# Patient Record
Sex: Female | Born: 1965 | Race: Black or African American | Hispanic: No | Marital: Single | Smoking: Former smoker
Health system: Southern US, Community
[De-identification: ages and names within clinical notes are randomized; demographics above are authoritative.]

## PROBLEM LIST (undated history)

## (undated) DIAGNOSIS — R600 Localized edema: Secondary | ICD-10-CM

## (undated) DIAGNOSIS — I10 Essential (primary) hypertension: Secondary | ICD-10-CM

## (undated) DIAGNOSIS — E119 Type 2 diabetes mellitus without complications: Secondary | ICD-10-CM

## (undated) DIAGNOSIS — E785 Hyperlipidemia, unspecified: Secondary | ICD-10-CM

## (undated) HISTORY — DX: Hyperlipidemia, unspecified: E78.5

---

## 2000-10-26 ENCOUNTER — Emergency Department (HOSPITAL_COMMUNITY): Admission: EM | Admit: 2000-10-26 | Discharge: 2000-10-26 | Payer: Self-pay | Admitting: Emergency Medicine

## 2000-10-26 ENCOUNTER — Encounter: Payer: Self-pay | Admitting: Emergency Medicine

## 2001-03-02 ENCOUNTER — Emergency Department (HOSPITAL_COMMUNITY): Admission: EM | Admit: 2001-03-02 | Discharge: 2001-03-02 | Payer: Self-pay | Admitting: Emergency Medicine

## 2001-05-19 ENCOUNTER — Other Ambulatory Visit: Admission: RE | Admit: 2001-05-19 | Discharge: 2001-05-19 | Payer: Self-pay | Admitting: Family Medicine

## 2002-09-27 ENCOUNTER — Emergency Department (HOSPITAL_COMMUNITY): Admission: EM | Admit: 2002-09-27 | Discharge: 2002-09-27 | Payer: Self-pay

## 2004-02-06 ENCOUNTER — Ambulatory Visit: Payer: Self-pay | Admitting: Family Medicine

## 2004-02-08 ENCOUNTER — Emergency Department (HOSPITAL_COMMUNITY): Admission: EM | Admit: 2004-02-08 | Discharge: 2004-02-08 | Payer: Self-pay | Admitting: Emergency Medicine

## 2005-11-25 ENCOUNTER — Emergency Department (HOSPITAL_COMMUNITY): Admission: EM | Admit: 2005-11-25 | Discharge: 2005-11-25 | Payer: Self-pay | Admitting: Emergency Medicine

## 2007-01-18 ENCOUNTER — Emergency Department (HOSPITAL_COMMUNITY): Admission: EM | Admit: 2007-01-18 | Discharge: 2007-01-19 | Payer: Self-pay | Admitting: Emergency Medicine

## 2007-04-16 ENCOUNTER — Emergency Department (HOSPITAL_COMMUNITY): Admission: EM | Admit: 2007-04-16 | Discharge: 2007-04-16 | Payer: Self-pay | Admitting: Family Medicine

## 2007-05-10 ENCOUNTER — Emergency Department (HOSPITAL_COMMUNITY): Admission: EM | Admit: 2007-05-10 | Discharge: 2007-05-10 | Payer: Self-pay | Admitting: Emergency Medicine

## 2007-07-16 ENCOUNTER — Emergency Department (HOSPITAL_COMMUNITY): Admission: EM | Admit: 2007-07-16 | Discharge: 2007-07-17 | Payer: Self-pay | Admitting: Emergency Medicine

## 2007-08-02 ENCOUNTER — Ambulatory Visit: Payer: Self-pay | Admitting: Family Medicine

## 2007-08-02 ENCOUNTER — Encounter: Payer: Self-pay | Admitting: Family Medicine

## 2007-08-02 LAB — CONVERTED CEMR LAB
AST: 17 units/L (ref 0–37)
Albumin: 4 g/dL (ref 3.5–5.2)
Alkaline Phosphatase: 79 units/L (ref 39–117)
BUN: 9 mg/dL (ref 6–23)
Basophils Relative: 0 % (ref 0–1)
Eosinophils Absolute: 0.1 10*3/uL (ref 0.0–0.7)
HDL: 47 mg/dL (ref >39)
Helicobacter Pylori Antibody-IgG: <0.4
LDL Cholesterol: 122 mg/dL — ABNORMAL HIGH (ref 0–99)
MCHC: 31.6 g/dL (ref 30.0–36.0)
MCV: 94.5 fL (ref 78.0–100.0)
Monocytes Relative: 8 % (ref 3–12)
Neutrophils Relative %: 42 % — ABNORMAL LOW (ref 43–77)
Platelets: 346 10*3/uL (ref 150–400)
Potassium: 4.2 meq/L (ref 3.5–5.3)
RBC: 4.39 M/uL (ref 3.87–5.11)
RDW: 14 % (ref 11.5–15.5)
TSH: 0.664 microintl units/mL (ref 0.350–5.50)
Total CHOL/HDL Ratio: 3.8

## 2007-08-04 ENCOUNTER — Ambulatory Visit (HOSPITAL_COMMUNITY): Admission: RE | Admit: 2007-08-04 | Discharge: 2007-08-04 | Payer: Self-pay | Admitting: Family Medicine

## 2007-09-26 ENCOUNTER — Emergency Department (HOSPITAL_COMMUNITY): Admission: EM | Admit: 2007-09-26 | Discharge: 2007-09-26 | Payer: Self-pay | Admitting: Family Medicine

## 2008-01-24 ENCOUNTER — Emergency Department (HOSPITAL_COMMUNITY): Admission: EM | Admit: 2008-01-24 | Discharge: 2008-01-24 | Payer: Self-pay | Admitting: Family Medicine

## 2008-04-23 ENCOUNTER — Emergency Department (HOSPITAL_COMMUNITY): Admission: EM | Admit: 2008-04-23 | Discharge: 2008-04-23 | Payer: Self-pay | Admitting: Family Medicine

## 2008-12-07 ENCOUNTER — Emergency Department (HOSPITAL_COMMUNITY): Admission: EM | Admit: 2008-12-07 | Discharge: 2008-12-07 | Payer: Self-pay | Admitting: Emergency Medicine

## 2009-03-05 IMAGING — US US ABDOMEN COMPLETE
1 series · 14 of 25 positions shown · non-contrast
Comparison: Abdominal radiographs 01/19/2007.

CLINICAL DATA: Recurrent abdominal pain and nausea

ABDOMEN ULTRASOUND
TECHNIQUE: Complete abdominal ultrasound examination was performed
including evaluation of the liver, gallbladder, bile ducts,
pancreas, kidneys, spleen, IVC, and abdominal aorta.

[Series 1: unknown · 0.33mm/px · 14 of 62 slices shown]
[im 1/62]
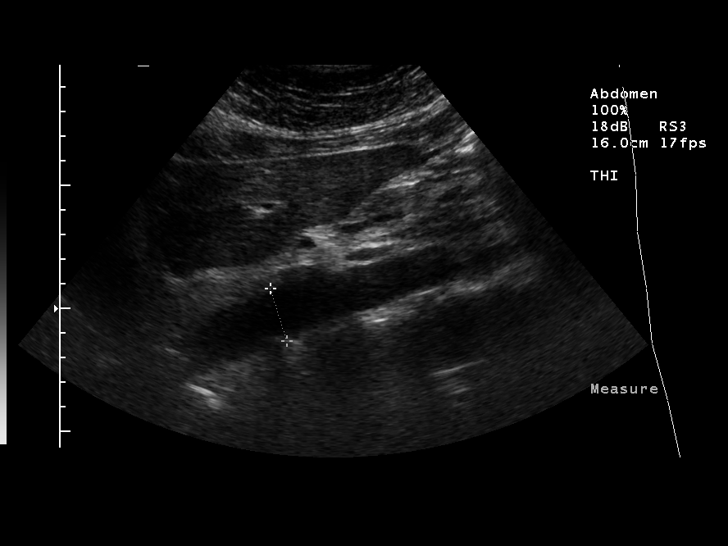
[im 6/62]
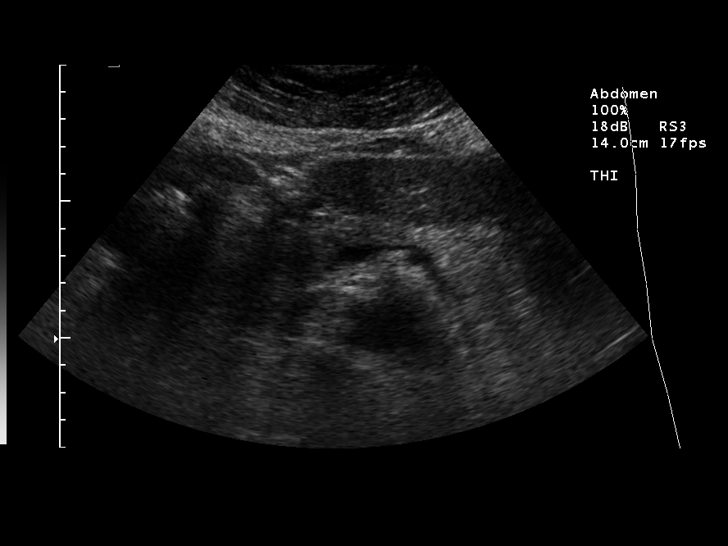
[im 11/62]
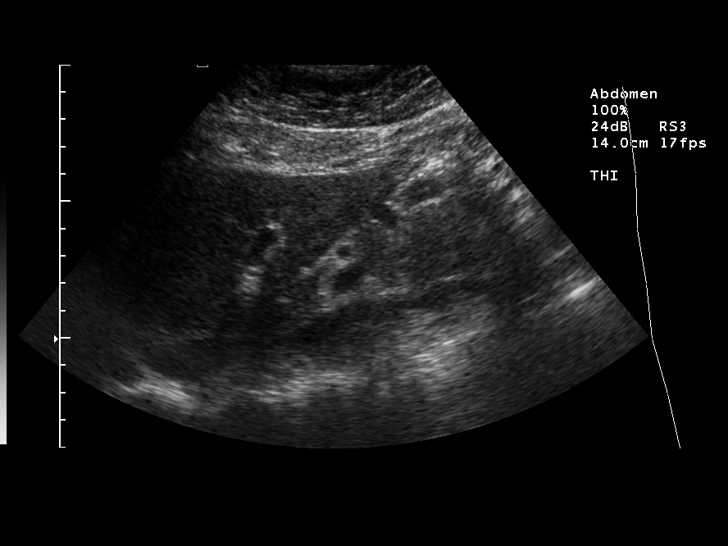
[im 16/62]
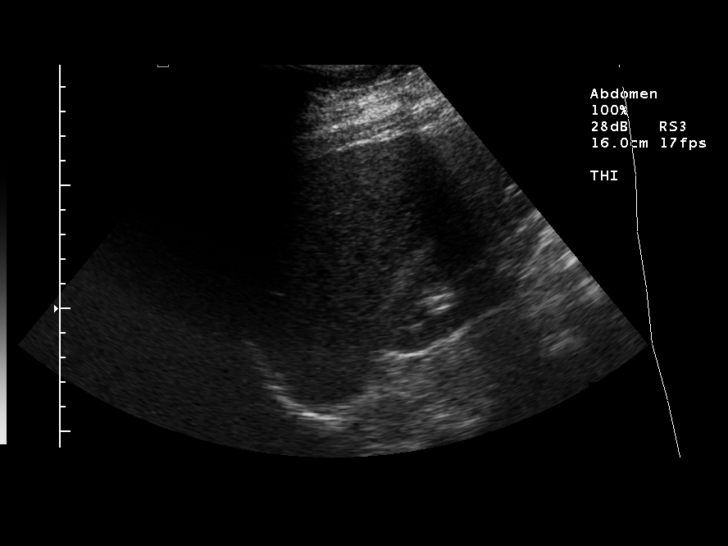
[im 21/62]
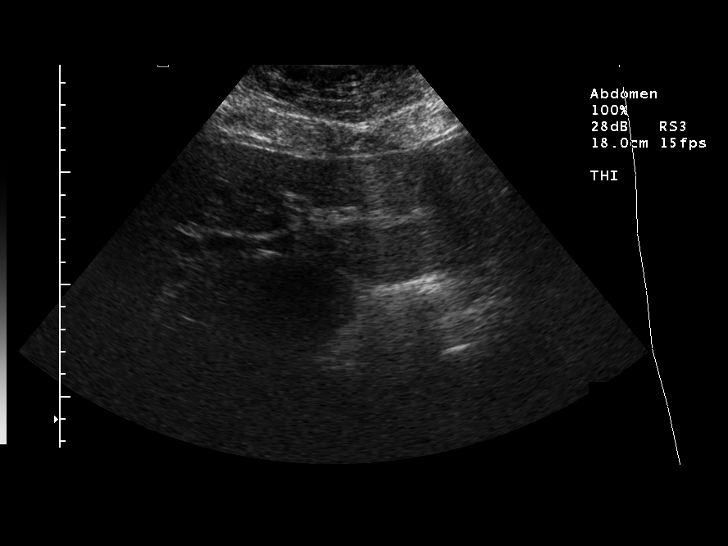
[im 23/62]
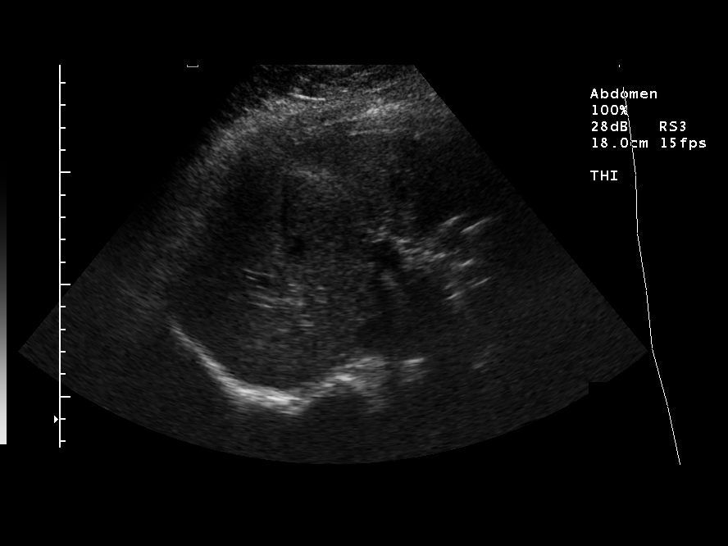
[im 28/62]
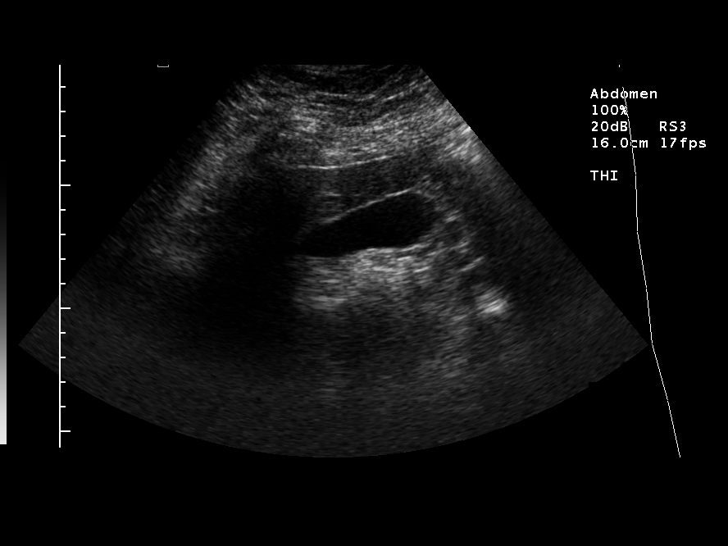
[im 34/62]
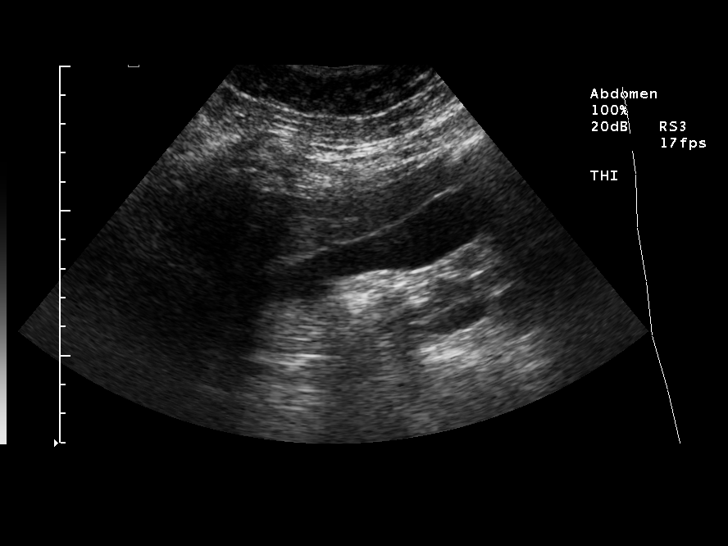
[im 39/62]
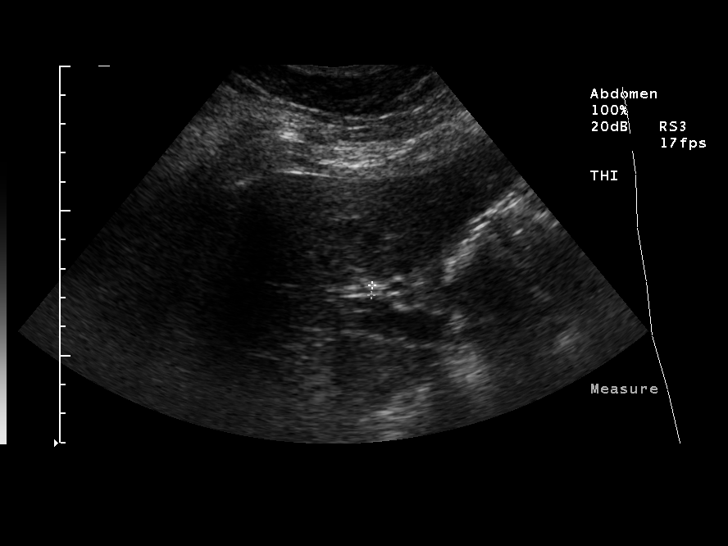
[im 41/62]
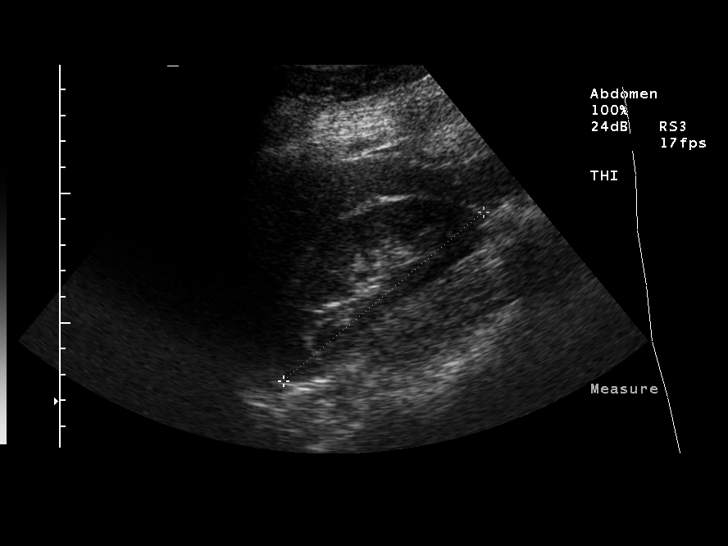
[im 46/62]
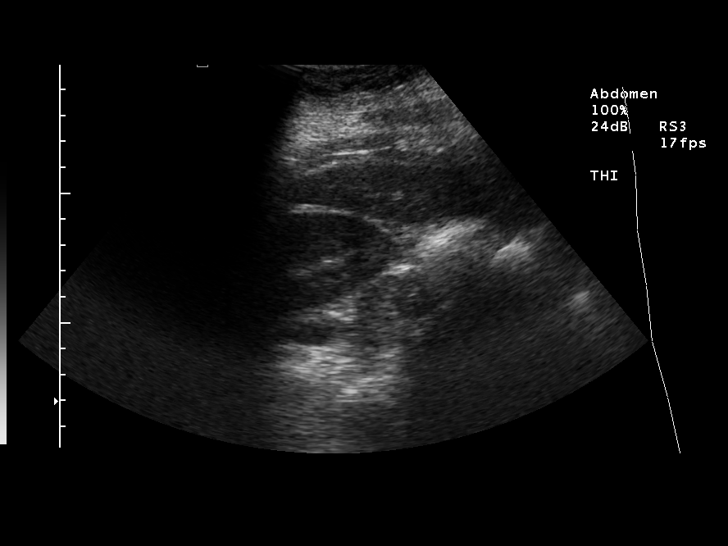
[im 51/62]
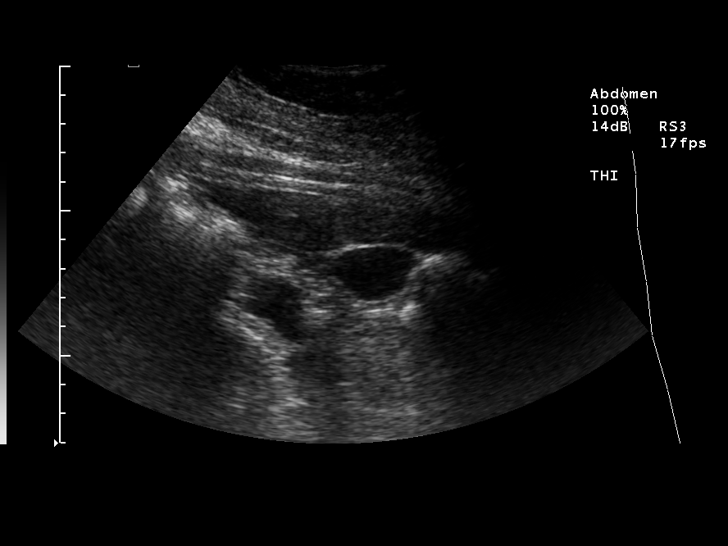
[im 56/62]
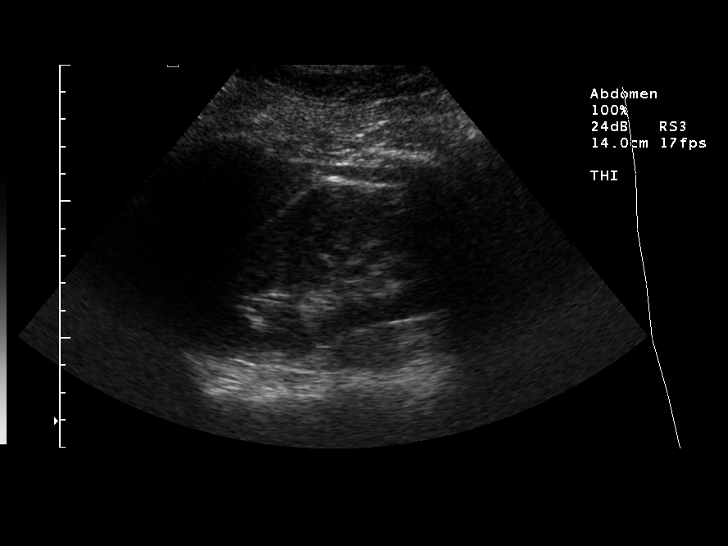
[im 62/62]
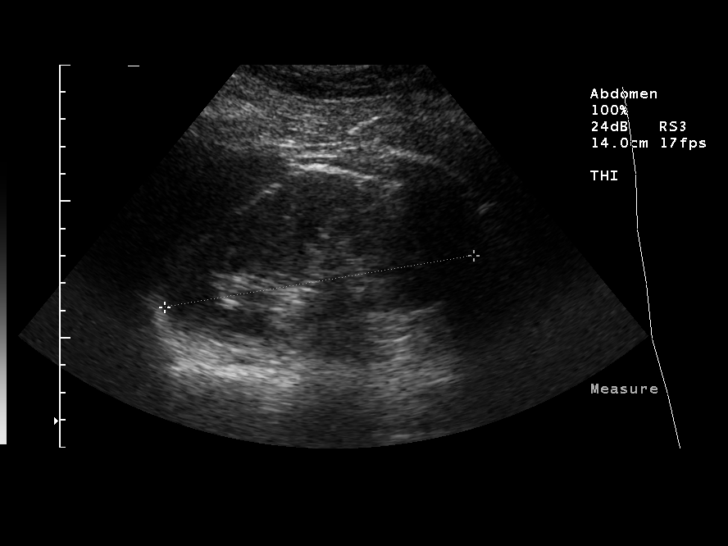

[14 of 25 positions shown; findings below may reference images not displayed]

The report from an
abdominal ultrasound dated 10/26/2000 is correlated - that study is
not available for direct comparison.
FINDINGS: The gallbladder appears normal without gallstones or wall
thickening.  There is no biliary dilatation.  The visualized liver,
spleen, pancreas, IVC and abdominal aorta appear normal.  There is
no ascites.  Both kidneys appear normal, measuring 10.1 cm in
length on the right and 11.4 cm in length on the left.
IMPRESSION: Normal abdominal ultrasound.

## 2009-12-24 ENCOUNTER — Emergency Department (HOSPITAL_COMMUNITY): Admission: EM | Admit: 2009-12-24 | Discharge: 2009-12-24 | Payer: Self-pay | Admitting: Family Medicine

## 2010-09-14 ENCOUNTER — Inpatient Hospital Stay (INDEPENDENT_AMBULATORY_CARE_PROVIDER_SITE_OTHER)
Admission: RE | Admit: 2010-09-14 | Discharge: 2010-09-14 | Disposition: A | Discharge status: Home / Self Care | Payer: Self-pay | Source: Ambulatory Visit | Attending: Emergency Medicine | Admitting: Emergency Medicine

## 2010-09-14 DIAGNOSIS — M779 Enthesopathy, unspecified: Secondary | ICD-10-CM

## 2010-11-05 LAB — URINALYSIS, ROUTINE W REFLEX MICROSCOPIC
Bilirubin Urine: NEGATIVE
Glucose, UA: NEGATIVE
Ketones, ur: NEGATIVE
pH: 6

## 2010-11-05 LAB — POCT PREGNANCY, URINE: Preg Test, Ur: NEGATIVE

## 2010-11-05 LAB — HEPATIC FUNCTION PANEL
Alkaline Phosphatase: 87
Bilirubin, Direct: <0.1
Total Protein: 7.2

## 2010-11-05 LAB — CBC
HCT: 38.6
Hemoglobin: 13
MCHC: 33.6
MCV: 93.3
RDW: 14.5

## 2010-11-05 LAB — POCT I-STAT, CHEM 8
BUN: 12
Calcium, Ion: 1.23
HCT: 40
TCO2: 29

## 2010-11-05 LAB — LIPASE, BLOOD: Lipase: 31

## 2010-11-05 LAB — DIFFERENTIAL
Basophils Absolute: 0
Basophils Relative: 0
Eosinophils Relative: 2
Monocytes Absolute: 0.6

## 2010-11-12 LAB — POCT URINALYSIS DIP (DEVICE)
Glucose, UA: NEGATIVE mg/dL
Hgb urine dipstick: NEGATIVE
Nitrite: NEGATIVE
Specific Gravity, Urine: 1.02 (ref 1.005–1.030)
pH: 6 (ref 5.0–8.0)

## 2010-11-16 LAB — URINALYSIS, ROUTINE W REFLEX MICROSCOPIC
Ketones, ur: NEGATIVE
Nitrite: NEGATIVE
Specific Gravity, Urine: 1.016
pH: 7

## 2010-11-16 LAB — COMPREHENSIVE METABOLIC PANEL
BUN: 9
CO2: 30
Calcium: 9.3
Creatinine, Ser: 0.91
GFR calc non Af Amer: >60
Glucose, Bld: 96
Total Protein: 7.3

## 2010-11-16 LAB — DIFFERENTIAL
Eosinophils Absolute: 0.1 — ABNORMAL LOW
Lymphocytes Relative: 38
Lymphs Abs: 3.2
Monocytes Relative: 9
Neutro Abs: 4.3
Neutrophils Relative %: 52

## 2010-11-16 LAB — CBC
Hemoglobin: 12.7
MCHC: 33.4
MCV: 94
RBC: 4.03
RDW: 13.8

## 2010-11-16 LAB — PREGNANCY, URINE: Preg Test, Ur: NEGATIVE

## 2010-11-16 LAB — LIPASE, BLOOD: Lipase: 54

## 2011-01-15 ENCOUNTER — Emergency Department (INDEPENDENT_AMBULATORY_CARE_PROVIDER_SITE_OTHER)
Admission: EM | Admit: 2011-01-15 | Discharge: 2011-01-15 | Disposition: A | Discharge status: Home / Self Care | Payer: Self-pay | Source: Home / Self Care | Attending: Emergency Medicine | Admitting: Emergency Medicine

## 2011-01-15 ENCOUNTER — Encounter: Payer: Self-pay | Admitting: Emergency Medicine

## 2011-01-15 DIAGNOSIS — J069 Acute upper respiratory infection, unspecified: Secondary | ICD-10-CM

## 2011-01-15 MED ORDER — BENZONATATE 200 MG PO CAPS: 200.0000 mg | ORAL_CAPSULE | Freq: Three times a day (TID) | ORAL | Status: AC | PRN

## 2011-01-15 MED ORDER — TRAMADOL HCL 50 MG PO TABS: 100.0000 mg | ORAL_TABLET | Freq: Three times a day (TID) | ORAL | Status: AC | PRN

## 2011-01-15 NOTE — ED Notes (Signed)
Starting this morning pt has pain in chest when coughing. She also has chills, and gets sweaty. Non productive cough.

## 2011-01-15 NOTE — ED Provider Notes (Signed)
History     CSN: [id number] Arrival date & time: 01/15/2011  4:31 PM   First MD Initiated Contact with Patient 01/15/11 1637      Chief Complaint  Patient presents with  . Cough    (Consider location/radiation/quality/duration/timing/severity/associated sxs/prior treatment) HPI Comments: Sarah Macdonald has had a one-day history of nonproductive cough, aching in chest, wheezing, has felt hot and cold, has had rhinorrhea which is yellow, and headache. She denies any sore throat, nausea, vomiting, or diarrhea. She has had no specific exposures, has not had a flu shot, and she does smoke a quarter pack of cigarettes a day. She has not tried any medications. She rates her pain as an 8/10 in intensity for the chest.  Patient is a 45 y.o. female presenting with cough.  Cough Associated symptoms include chest pain, chills, rhinorrhea and wheezing. Pertinent negatives include no ear pain, no sore throat, no shortness of breath and no eye redness.    History reviewed. No pertinent past medical history.  History reviewed. No pertinent past surgical history.  History reviewed. No pertinent family history.  History  Substance Use Topics  . Smoking status: Current Everyday Smoker -- 0.2 packs/day  . Smokeless tobacco: Not on file  . Alcohol Use: No    OB History    Grav Para Term Preterm Abortions TAB SAB Ect Mult Living                  Review of Systems  Constitutional: Positive for fever, chills and fatigue.  HENT: Positive for rhinorrhea. Negative for ear pain, congestion, sore throat, sneezing, neck stiffness, voice change and postnasal drip.   Eyes: Negative for pain, discharge and redness.  Respiratory: Positive for cough and wheezing. Negative for chest tightness and shortness of breath.   Cardiovascular: Positive for chest pain.  Gastrointestinal: Negative for nausea, vomiting, abdominal pain and diarrhea.  Skin: Negative for rash.    Allergies  Review of patient's allergies  indicates no known allergies.  Home Medications   Current Outpatient Rx  Name Route Sig Dispense Refill  . BENZONATATE 200 MG PO CAPS Oral Take 1 capsule (200 mg total) by mouth 3 (three) times daily as needed for cough. 30 capsule 0  . TRAMADOL HCL 50 MG PO TABS Oral Take 2 tablets (100 mg total) by mouth every 8 (eight) hours as needed for pain. Maximum dose= 8 tablets per day 30 tablet 0    BP 145/73  Pulse 85  Temp(Src) 99.9 F (37.7 C) (Oral)  Resp 20  SpO2 98%  LMP 12/28/2010  Physical Exam  Nursing note and vitals reviewed. Constitutional: She appears well-developed and well-nourished. No distress.  HENT:  Head: Normocephalic and atraumatic.  Right Ear: External ear normal.  Left Ear: External ear normal.  Nose: Nose normal.  Mouth/Throat: Oropharynx is clear and moist. No oropharyngeal exudate.  Eyes: Conjunctivae and EOM are normal. Pupils are equal, round, and reactive to light. Right eye exhibits no discharge. Left eye exhibits no discharge.  Neck: Normal range of motion. Neck supple.  Cardiovascular: Normal rate, regular rhythm and normal heart sounds.   Pulmonary/Chest: Effort normal and breath sounds normal. No stridor. No respiratory distress. She has no wheezes. She has no rales. She exhibits no tenderness.  Lymphadenopathy:    She has no cervical adenopathy.  Skin: Skin is warm and dry. No rash noted. She is not diaphoretic.    ED Course  Procedures (including critical care time)  Labs Reviewed - No data  to display No results found.   1. Upper respiratory tract infection       MDM          Roque Lias, MD 01/15/11 [id number]

## 2011-04-17 ENCOUNTER — Emergency Department (INDEPENDENT_AMBULATORY_CARE_PROVIDER_SITE_OTHER)
Admission: EM | Admit: 2011-04-17 | Discharge: 2011-04-17 | Disposition: A | Discharge status: Home / Self Care | Payer: 59 | Source: Home / Self Care | Attending: Family Medicine | Admitting: Family Medicine

## 2011-04-17 ENCOUNTER — Encounter (HOSPITAL_COMMUNITY): Payer: Self-pay | Admitting: Emergency Medicine

## 2011-04-17 DIAGNOSIS — G4762 Sleep related leg cramps: Secondary | ICD-10-CM

## 2011-04-17 LAB — POCT I-STAT, CHEM 8
Glucose, Bld: 110 mg/dL — ABNORMAL HIGH (ref 70–99)
HCT: 40 % (ref 36.0–46.0)
Hemoglobin: 13.6 g/dL (ref 12.0–15.0)
Potassium: 3.9 mEq/L (ref 3.5–5.1)

## 2011-04-17 NOTE — ED Notes (Signed)
Cramping in both legs from groin to feet.  Particularly noticed at night.  Patient has also noticed resent swelling in feet and knees.  Reports cramping sensation for a week.

## 2011-04-17 NOTE — ED Provider Notes (Signed)
History     CSN: [id number]  Arrival date & time 04/17/11  7829   First MD Initiated Contact with Patient 04/17/11 1106      Chief Complaint  Patient presents with  . Leg Pain    (Consider location/radiation/quality/duration/timing/severity/associated sxs/prior treatment) HPI Comments: Sarah Macdonald presents for evaluation of bilateral leg cramping at night. She reports onset of symptoms over the last week. She denies any specific trigger or injury. She reports that she does a lot of walking with her job, but that this job is not new. She reports that she has done. This job for the last year and 3 months. She states that she was given a specific pair of shoes to wear at work with uniform, but that they were too big on her. She denies any new medications. She denies any dietary changes.   The history is provided by the patient.    History reviewed. No pertinent past medical history.  History reviewed. No pertinent past surgical history.  History reviewed. No pertinent family history.  History  Substance Use Topics  . Smoking status: Current Everyday Smoker -- 0.2 packs/day  . Smokeless tobacco: Not on file  . Alcohol Use: No    OB History    Grav Para Term Preterm Abortions TAB SAB Ect Mult Living                  Review of Systems  Constitutional: Negative.   HENT: Negative.   Eyes: Negative.   Respiratory: Negative.   Cardiovascular: Positive for leg swelling.  Gastrointestinal: Negative.   Genitourinary: Negative.   Musculoskeletal: Positive for myalgias.  Skin: Negative.   Neurological: Negative.     Allergies  Review of patient's allergies indicates no known allergies.  Home Medications  No current outpatient prescriptions on file.  BP 160/90  Pulse 69  Temp(Src) 97.9 F (36.6 C) (Oral)  Resp 20  SpO2 100%  LMP 04/10/2011  Physical Exam  Nursing note and vitals reviewed. Constitutional: She is oriented to person, place, and time. She appears  well-developed and well-nourished.  HENT:  Head: Normocephalic and atraumatic.  Eyes: EOM are normal.  Neck: Normal range of motion.  Pulmonary/Chest: Effort normal.  Musculoskeletal: Normal range of motion.       Left lower leg: She exhibits swelling and edema.       Legs:      No tenderness to palpation; full range of motion in lower extremities; DP and PT pulses palpated in lower extremities bilaterally; 5/5 strength; no difficulty with ambulation  Neurological: She is alert and oriented to person, place, and time.  Skin: Skin is warm and dry.  Psychiatric: Her behavior is normal.    ED Course  Procedures (including critical care time)  Labs Reviewed  POCT I-STAT, CHEM 8 - Abnormal; Notable for the following:    Glucose, Bld 110 (*)    All other components within normal limits   No results found.   1. Nocturnal leg cramps       MDM  Labs reviewed; no abnormalities noted; exam unremarkable; referred to [facility] to establish care with a primary provider        Renaee Munda, MD 04/17/11 1351

## 2011-04-17 NOTE — Discharge Instructions (Signed)
It is unclear at this time is causing your symptoms. There are, in fact, many causes for her symptoms. Each of these causes requires an extensive workup. Your lab work today was normal. Essentially ruling out some causes. Please call the number on your discharge paperwork, and establish care with a provider that accepts your insurance. Please return to care, sooner should your symptoms not improve, or worsen in any way.

## 2011-04-21 ENCOUNTER — Emergency Department (HOSPITAL_COMMUNITY)
Admission: EM | Admit: 2011-04-21 | Discharge: 2011-04-21 | Disposition: A | Discharge status: Home Health | Payer: 59 | Attending: Emergency Medicine | Admitting: Emergency Medicine

## 2011-04-21 ENCOUNTER — Encounter (HOSPITAL_COMMUNITY): Payer: Self-pay | Admitting: *Deleted

## 2011-04-21 DIAGNOSIS — M7989 Other specified soft tissue disorders: Secondary | ICD-10-CM | POA: Insufficient documentation

## 2011-04-21 DIAGNOSIS — F17200 Nicotine dependence, unspecified, uncomplicated: Secondary | ICD-10-CM | POA: Insufficient documentation

## 2011-04-21 NOTE — ED Notes (Signed)
To ed for eval bilateral leg swelling since last night. No sob. States her legs cramp every night.

## 2011-04-21 NOTE — Discharge Instructions (Signed)
Follow back up with urgent care or use resource guide below to find a primary care doctor. Suspect swelling do to steroid shot given to you at urgent care 2 days ago. Seek followup for new or worse symptoms that include shortness of breath or chest pain or if leg swelling does not go away over the next 2-4 days.  RESOURCE GUIDE  Dental Problems  Patients with Medicaid: [facility] [id number]                                           [id number] Phone:  [phone]                                                  Phone:  [phone]  If unable to pay or uninsured, contact:  [facility] or [facility]. to become qualified for the adult dental clinic.  Chronic Pain Problems Contact [facility]  [phone] Patients need to be referred by their primary care doctor.  Insufficient Money for Medicine Contact [employer]:  call "211" or [facility] [phone].  No Primary Care Doctor Call [facility]  [phone] Other agencies that provide inexpensive medical care    [facility]  [phone]    [facility]  [phone]    [facility]  [phone]    [facility]  [phone]    [facility]  [phone]    [facility]  [phone]  Psychological Services [facility]  [phone] [facility]  [phone] [facility]   [phone] (emergency services ([phone])  Substance Abuse Resources [facility]  [phone] [facility] [phone] [facility] [place] [facility] [phone] Floydene Flock [phone] Residential & Outpatient Substance Abuse Program  [phone]  Abuse/Neglect [facility] Child Abuse Hotline 256-375-4574 [facility] Child Abuse Hotline [phone] (After Hours)  Emergency Shelter [facility] [phone]  Maternity Homes Room at the  [facility] ([phone] [facility] (612)666-5833  MRSA Hotline #:   772 196 6846    [facility] Resources  [facility]     [employer]                          [facility] Dept. [address] [place]                       [address]      [address]  [place]                                                Cristobal Goldmann Phone:  [phone]  Phone:  [phone]                 Phone:  [phone]  [place] [facility] Phone:  [phone]  [place] Child Abuse Hotline (336) 342-1394 [phone]) [phone] (After Hours)   

## 2011-04-21 NOTE — ED Provider Notes (Signed)
History     CSN: [id number]  Arrival date & time 04/21/11  4696   First MD Initiated Contact with Patient 04/21/11 1116      Chief Complaint  Patient presents with  . Leg Swelling    (Consider location/radiation/quality/duration/timing/severity/associated sxs/prior treatment) The history is provided by the patient.   patient is a [age]-year-old female process with the complaint of bilateral leg swelling that started last night that improved overnight but worse this morning she got back on her feet she has had a history of bilateral leg cramps on and off for the past year was seen in urgent care on March 9 for this had the i-STAT metabolic panel drawn and was given a shot of steroids most likely not able to specifically confirm that. Denies shortness of breath chest pain swelling anywhere else.  History reviewed. No pertinent past medical history.  History reviewed. No pertinent past surgical history.  History reviewed. No pertinent family history.  History  Substance Use Topics  . Smoking status: Current Everyday Smoker -- 0.2 packs/day  . Smokeless tobacco: Not on file  . Alcohol Use: No    OB History    Grav Para Term Preterm Abortions TAB SAB Ect Mult Living                  Review of Systems  Constitutional: Negative for fever and chills.  HENT: Negative for congestion, facial swelling and neck pain.   Eyes: Negative for visual disturbance.  Respiratory: Negative for cough and shortness of breath.   Cardiovascular: Positive for leg swelling. Negative for chest pain.  Gastrointestinal: Negative for nausea, vomiting, abdominal pain and diarrhea.  Genitourinary: Negative for dysuria.  Musculoskeletal: Negative for back pain.  Skin: Negative for rash.  Neurological: Negative for weakness, numbness and headaches.  Hematological: Does not bruise/bleed easily.    Allergies  Review of patient's allergies indicates no known allergies.  Home Medications  No current  outpatient prescriptions on file.  BP 154/97  Pulse 75  Temp(Src) 98.6 F (37 C) (Oral)  SpO2 98%  LMP 04/10/2011  Physical Exam  Nursing note and vitals reviewed. Constitutional: She is oriented to person, place, and time. She appears well-developed and well-nourished. No distress.  HENT:  Head: Normocephalic and atraumatic.  Mouth/Throat: Oropharynx is clear and moist.  Eyes: Conjunctivae and EOM are normal. Pupils are equal, round, and reactive to light.  Neck: Normal range of motion. Neck supple.  Cardiovascular: Normal rate, regular rhythm, normal heart sounds and intact distal pulses.   No murmur heard. Pulmonary/Chest: Effort normal and breath sounds normal. No respiratory distress.  Abdominal: Soft. Bowel sounds are normal. There is no tenderness.  Musculoskeletal: Normal range of motion. She exhibits edema. She exhibits no tenderness.       Bilateral lower extremity swelling mostly at the ankles no pitting edema. Neurovascular intact. No swelling of the knees or thighs.  Neurological: She is alert and oriented to person, place, and time. No cranial nerve deficit. She exhibits normal muscle tone. Coordination normal.  Skin: Skin is warm. No rash noted. No erythema.    ED Course  Procedures (including critical care time)  Labs Reviewed - No data to display No results found.   1. Leg swelling       MDM   Patient seen at urgent care on March 9 for history of leg cramps that have been going on for one year patient had i-STAT metabolic panel without significant findings and patient was given  most likely a steroid shot. Presents today with onset of bilateral lower extremity leg swelling that started yesterday improved overnight and then once back on her feet started to get worse again denies swelling anywhere else chest pain or shortness of breath. Suspect swelling may be due to to steroid shot and would expect that to resolve his steroid medication wears off over the next  few days. Patient given referral information for followup. Work note provided for today.        Shelda Jakes, MD 04/21/11 [id number]

## 2011-05-05 ENCOUNTER — Emergency Department (HOSPITAL_COMMUNITY)
Admission: EM | Admit: 2011-05-05 | Discharge: 2011-05-05 | Disposition: A | Discharge status: Home / Self Care | Payer: 59 | Source: Home / Self Care | Attending: Emergency Medicine | Admitting: Emergency Medicine

## 2011-05-05 ENCOUNTER — Encounter (HOSPITAL_COMMUNITY): Payer: Self-pay | Admitting: *Deleted

## 2011-05-05 DIAGNOSIS — R600 Localized edema: Secondary | ICD-10-CM

## 2011-05-05 DIAGNOSIS — R609 Edema, unspecified: Secondary | ICD-10-CM

## 2011-05-05 DIAGNOSIS — R030 Elevated blood-pressure reading, without diagnosis of hypertension: Secondary | ICD-10-CM

## 2011-05-05 MED ORDER — MEDICAL COMPRESSION STOCKINGS MISC: 1.0000 | Freq: Every day | Status: DC

## 2011-05-05 MED ORDER — FUROSEMIDE 20 MG PO TABS: 20.0000 mg | ORAL_TABLET | Freq: Every day | ORAL | Status: DC | PRN

## 2011-05-05 NOTE — Discharge Instructions (Signed)
The swelling in your legs can be from a combination of factors including sodium (salt) intake,and prolonged standing or sitting.  Reduce your salt intake to 2000 mg of sodium per day. Read all of your food labels for the amount of sodium per serving. Increase your water intake to 6-8 glasses per day. Wear support stockings while working. If standing for long periods of time, walking in place. Elevate your legs above heart level. Prop them up on a couple of pillows at night while sleeping. It is important that you obtain a primary care dr. Bonita Quin need follow up for your elevated blood pressure and your leg swelling.

## 2011-05-05 NOTE — ED Notes (Signed)
Pt seen and treated x 2 3/9 and 3/13 re: bilateral leg and feet swelling - right leg and foot resolved continues with left leg and foot swelling and cramping

## 2011-05-05 NOTE — ED Provider Notes (Signed)
History     CSN: [id number]  Arrival date & time 05/05/11  4098   First MD Initiated Contact with Patient 05/05/11 [id number]      Chief Complaint  Patient presents with  . Leg Swelling  . Foot Pain    (Consider location/radiation/quality/duration/timing/severity/associated sxs/prior treatment) HPI Comments: Patient presents today with complaints of swelling in bilateral lower legs. She has been evaluated on March 9 and March 13 for the same complaints. She states that the swelling in her right leg significantly improved but the swelling in her left leg persists. 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 She has been soaking her legs and applying rubbing alcohol for her symptoms. HER-2 previous visits patient has been provided information to obtain a primary care physician for followup. She states that she has not done this and no longer has the information.   History reviewed. No pertinent past medical history.  History reviewed. No pertinent past surgical history.  History reviewed. No pertinent family history.  History  Substance Use Topics  . Smoking status: Current Everyday Smoker -- 0.2 packs/day  . Smokeless tobacco: Not on file  . Alcohol Use: No    OB History    Grav Para Term Preterm Abortions TAB SAB Ect Mult Living                  Review of Systems  Constitutional: Negative for fever and chills.  Respiratory: Negative for cough and shortness of breath.   Cardiovascular: Positive for leg swelling. Negative for chest pain.  Musculoskeletal: Negative for joint swelling.    Allergies  Review of patient's allergies indicates no known allergies.  Home Medications   Current Outpatient Rx  Name Route Sig Dispense Refill  . MULTI-VITAMIN/MINERALS PO TABS Oral Take 1 tablet by mouth daily.    Marland Kitchen MEDICAL COMPRESSION STOCKINGS MISC Does not apply 1 Package by Does not apply route daily. 1 each 0    . FUROSEMIDE 20 MG PO TABS Oral Take 1 tablet (20 mg total) by mouth daily as needed (for leg swelling). 5 tablet 0    BP 165/97  Pulse 70  Temp(Src) 97.9 F (36.6 C) (Oral)  Resp 16  SpO2 99%  LMP 04/10/2011  Physical Exam  Nursing note and vitals reviewed. Constitutional: She appears well-developed and well-nourished. No distress.  HENT:  Head: Normocephalic and atraumatic.  Cardiovascular: Normal rate, regular rhythm and normal heart sounds.   Pulses:      Dorsalis pedis pulses are 2+ on the right side, and 2+ on the left side.       Posterior tibial pulses are 2+ on the right side, and 2+ on the left side.       Trace PTE Rt LE. 1+ PTE Lt LE.   Pulmonary/Chest: Effort normal and breath sounds normal. No respiratory distress.  Skin: Skin is warm and dry.    ED Course  Procedures (including critical care time)  Labs Reviewed - No data to display No results found.   1. Peripheral edema   2. Blood pressure elevated without history of HTN       MDM  Two previous visits reviewed (3/9 & 3/13). I stat 3/9. Persistent peripheral edema, though improved RLE. Discussed sodium reduction, elevating legs, support stockings during work hrs, avoid prolonged sitting and standing.  BP also noted to be in Stage I HTN or greater all 3 visits.  This was discussed with pt who responds "I don't have high blood pressure." Discussed that she will need follow up and consideration for prescription treatment for high blood pressure though pt is resistant.         Melody Comas, [place] 05/05/11 1042

## 2011-05-10 NOTE — ED Provider Notes (Signed)
Medical screening examination/treatment/procedure(s) were performed by non-physician practitioner and as supervising physician I was immediately available for consultation/collaboration.  Syaire Saber M. MD   Lonnie Rosado M Athan Casalino, MD 05/10/11 2141 

## 2011-06-11 ENCOUNTER — Ambulatory Visit: Payer: 59 | Admitting: Family Medicine

## 2011-07-15 ENCOUNTER — Telehealth (HOSPITAL_COMMUNITY): Payer: Self-pay | Admitting: *Deleted

## 2011-07-15 NOTE — ED Notes (Signed)
Pt. called about her FMLA papers she brought in on Mon. She said she had foot swelling and was out of work again in May. I reviewed pt.'s chart and called her back. I told her the medical portions of the form have N/A written on them. She said she did not write that, so her boss may have written it. I told her we did not see her in May and could not fill out papers for that time. I told her that her last visit with [place] was 3/27 and we gave her a work note to return 3/28. Her other visits in 3/9 and 3/13 were in the ED.  They could give her work notes for those days if they have not already. She said her legs swelled up again in May and could not work. I told her I was sorry but we could not do anything about that.  Pt. Instructed to return and pick up her original forms.Vassie Moselle 07/15/2011

## 2011-10-22 ENCOUNTER — Ambulatory Visit: Payer: 59 | Admitting: Family Medicine

## 2011-10-27 ENCOUNTER — Encounter (HOSPITAL_COMMUNITY): Payer: Self-pay | Admitting: Emergency Medicine

## 2011-10-27 DIAGNOSIS — M7989 Other specified soft tissue disorders: Secondary | ICD-10-CM | POA: Insufficient documentation

## 2011-10-27 DIAGNOSIS — Z79899 Other long term (current) drug therapy: Secondary | ICD-10-CM | POA: Insufficient documentation

## 2011-10-27 DIAGNOSIS — F17200 Nicotine dependence, unspecified, uncomplicated: Secondary | ICD-10-CM | POA: Insufficient documentation

## 2011-10-27 DIAGNOSIS — Z888 Allergy status to other drugs, medicaments and biological substances status: Secondary | ICD-10-CM | POA: Insufficient documentation

## 2011-10-27 NOTE — ED Notes (Signed)
Reports for 2 weeks having swelling to bil extremities; was placed on naproxen and fluid pill (triameterene/HCTZ) has been on for 2 months; has not helped with fluid build up; denies SOB, denies CP

## 2011-10-28 ENCOUNTER — Emergency Department (HOSPITAL_COMMUNITY)
Admission: EM | Admit: 2011-10-28 | Discharge: 2011-10-28 | Disposition: A | Discharge status: Home / Self Care | Payer: Self-pay | Attending: Emergency Medicine | Admitting: Emergency Medicine

## 2011-10-28 DIAGNOSIS — M7989 Other specified soft tissue disorders: Secondary | ICD-10-CM

## 2011-10-28 HISTORY — DX: Localized edema: R60.0

## 2011-10-28 LAB — URINALYSIS, ROUTINE W REFLEX MICROSCOPIC
Bilirubin Urine: NEGATIVE
Ketones, ur: NEGATIVE mg/dL
Leukocytes, UA: NEGATIVE
Nitrite: NEGATIVE
Specific Gravity, Urine: 1.014 (ref 1.005–1.030)
Urobilinogen, UA: 0.2 mg/dL (ref 0.0–1.0)

## 2011-10-28 LAB — COMPREHENSIVE METABOLIC PANEL
Albumin: 3.6 g/dL (ref 3.5–5.2)
BUN: 12 mg/dL (ref 6–23)
Creatinine, Ser: 0.9 mg/dL (ref 0.50–1.10)
Total Protein: 7.6 g/dL (ref 6.0–8.3)

## 2011-10-28 LAB — PRO B NATRIURETIC PEPTIDE: Pro B Natriuretic peptide (BNP): 60.8 pg/mL (ref 0–125)

## 2011-10-28 MED ORDER — FUROSEMIDE 20 MG PO TABS: 20.0000 mg | ORAL_TABLET | Freq: Two times a day (BID) | ORAL | Status: DC

## 2011-10-28 NOTE — ED Notes (Signed)
Pt c/o bilt LE swelling for 2-3 weeks.  Taking RX for Triamterene / HCTZ 37.5-25 mg with out improvement.  Deneis CP SOB. Staets pain 9/10 not relieved by naproxen

## 2011-10-28 NOTE — ED Provider Notes (Signed)
History     CSN: [id number]  Arrival date & time 10/27/11  2331   First MD Initiated Contact with Patient 10/28/11 0106      Chief Complaint  Patient presents with  . Leg Swelling    (Consider location/radiation/quality/duration/timing/severity/associated sxs/prior treatment) HPI Comments: 46 year old female with a history of peripheral edema which was noted as long ago as 6 months ago in March. She states that she has chronic edema of her lower extremities, she has been seen by [facility] in the past and treated with hydrochlorothiazide which has not improved her swelling.  She has associated pain in her bilateral feet secondary to the swelling. The swelling is persistent, gradually worsening, no asymmetry trauma immobilization or surgery. She has no difficulty with ambulating, no orthopnea, no abdominal or chest pain. She notes that the swelling goes down at night and it's worse during the day while she is on her feet. She has warm compression stockings in the past but has not worn them recently.  The history is provided by the patient and medical records.    Past Medical History  Diagnosis Date  . Edema leg     History reviewed. No pertinent past surgical history.  History reviewed. No pertinent family history.  History  Substance Use Topics  . Smoking status: Current Every Day Smoker -- 0.2 packs/day    Types: Cigarettes  . Smokeless tobacco: Not on file  . Alcohol Use: No    OB History    Grav Para Term Preterm Abortions TAB SAB Ect Mult Living                  Review of Systems  All other systems reviewed and are negative.    Allergies  Cortisone  Home Medications   Current Outpatient Rx  Name Route Sig Dispense Refill  . FUROSEMIDE 20 MG PO TABS Oral Take 1 tablet (20 mg total) by mouth daily as needed (for leg swelling). 5 tablet 0  . FUROSEMIDE 20 MG PO TABS Oral Take 1 tablet (20 mg total) by mouth 2 (two) times daily. 30 tablet 0     BP 141/86  Pulse 70  Temp 97.6 F (36.4 C) (Oral)  Resp 24  SpO2 97%  Physical Exam  Nursing note and vitals reviewed. Constitutional: She appears well-developed and well-nourished. No distress.  HENT:  Head: Normocephalic and atraumatic.  Mouth/Throat: Oropharynx is clear and moist. No oropharyngeal exudate.  Eyes: Conjunctivae normal and EOM are normal. Pupils are equal, round, and reactive to light. Right eye exhibits no discharge. Left eye exhibits no discharge. No scleral icterus.  Neck: Normal range of motion. Neck supple. No JVD present. No thyromegaly present.  Cardiovascular: Normal rate, regular rhythm, normal heart sounds and intact distal pulses.  Exam reveals no gallop and no friction rub.   No murmur heard. Pulmonary/Chest: Effort normal and breath sounds normal. No respiratory distress. She has no wheezes. She has no rales.  Abdominal: Soft. Bowel sounds are normal. She exhibits no distension and no mass. There is no tenderness.  Musculoskeletal: Normal range of motion. She exhibits edema ( Bilateral 1-2+ pitting edema, no asymmetry). She exhibits no tenderness.  Lymphadenopathy:    She has no cervical adenopathy.  Neurological: She is alert. Coordination normal.  Skin: Skin is warm and dry. No rash noted. No erythema.  Psychiatric: She has a normal mood and affect. Her behavior is normal.    ED Course  Procedures (including  critical care time)  Labs Reviewed  COMPREHENSIVE METABOLIC PANEL - Abnormal; Notable for the following:    Glucose, Bld 111 (*)     Alkaline Phosphatase 133 (*)     Total Bilirubin 0.2 (*)     GFR calc non Af Amer 75 (*)     GFR calc Af Amer 87 (*)     All other components within normal limits  URINALYSIS, ROUTINE W REFLEX MICROSCOPIC  PRO B NATRIURETIC PEPTIDE   No results found.   1. Swelling of left lower extremity   2. Swelling of right lower extremity       MDM  There is no redness or warmth to the legs, they appear  edematous bilaterally below the knees, there is no signs of anasarca or acsending edema. Will check albumin, rule out proteinuria, electrolytes as she has been on a diuretic, BMP though CHF less likely given no pulmonary symptoms, compression stockings, Lasix, anticipate discharge.  Laboratory data reveals normal BMP, normal albumin, no proteinuria, patient appears stable for discharge. Referrals given, Lasix for one month  Vida Roller, MD 10/28/11 [phone]

## 2011-11-01 ENCOUNTER — Ambulatory Visit: Payer: 59 | Admitting: Family Medicine

## 2011-11-04 ENCOUNTER — Emergency Department (HOSPITAL_COMMUNITY)
Admission: EM | Admit: 2011-11-04 | Discharge: 2011-11-04 | Disposition: A | Discharge status: Home / Self Care | Payer: 59 | Source: Home / Self Care | Attending: Emergency Medicine | Admitting: Emergency Medicine

## 2011-11-12 ENCOUNTER — Emergency Department (INDEPENDENT_AMBULATORY_CARE_PROVIDER_SITE_OTHER)
Admission: EM | Admit: 2011-11-12 | Discharge: 2011-11-12 | Disposition: A | Discharge status: Home / Self Care | Payer: Self-pay | Source: Home / Self Care

## 2011-11-12 ENCOUNTER — Encounter (HOSPITAL_COMMUNITY): Payer: Self-pay | Admitting: Emergency Medicine

## 2011-11-12 DIAGNOSIS — R252 Cramp and spasm: Secondary | ICD-10-CM

## 2011-11-12 HISTORY — DX: Essential (primary) hypertension: I10

## 2011-11-12 LAB — POCT I-STAT, CHEM 8
BUN: 12 mg/dL (ref 6–23)
Chloride: 101 mEq/L (ref 96–112)
Potassium: 3.7 mEq/L (ref 3.5–5.1)
Sodium: 140 mEq/L (ref 135–145)

## 2011-11-12 NOTE — ED Provider Notes (Signed)
Medical screening examination/treatment/procedure(s) were performed by non-physician practitioner and as supervising physician I was immediately available for consultation/collaboration.  Raynald Blend, MD 11/12/11 [phone]

## 2011-11-12 NOTE — ED Provider Notes (Signed)
History     CSN: [id number]  Arrival date & time 11/12/11  1057   None     Chief Complaint  Patient presents with  . Leg Pain    (Consider location/radiation/quality/duration/timing/severity/associated sxs/prior treatment) HPI Comments: 46-year-old female presents with cramping in both legs for the past 3 months. Scant sleep, is worse at night. She also takes 2 diuretics pills , Dyazide and Lasix 20 mg daily. She has a history of chronic peripheral edema in which she was prescribed the above medications. She also has a history of hypertension. She states the cramps are from the bilaterally or falls all way down to her toes. Most of the cramping paint is in her calves. She denies chest pain shortness of breath orthopnea cough. She states once her potassium was low which she also had muscle cramps associated with hyperkalemia.   Past Medical History  Diagnosis Date  . Edema leg   . Hypertension     History reviewed. No pertinent past surgical history.  No family history on file.  History  Substance Use Topics  . Smoking status: Current Every Day Smoker -- 0.2 packs/day    Types: Cigarettes  . Smokeless tobacco: Not on file  . Alcohol Use: No    OB History    Grav Para Term Preterm Abortions TAB SAB Ect Mult Living                  Review of Systems  Constitutional: Negative for fever, activity change and fatigue.  HENT: Negative.   Respiratory: Negative for cough, shortness of breath and wheezing.   Cardiovascular: Negative for chest pain and palpitations.  Gastrointestinal: Negative.   Genitourinary: Negative.   Musculoskeletal:       As per HPI  Skin: Negative for color change, pallor and rash.  Neurological: Negative.     Allergies  Cortisone  Home Medications   Current Outpatient Rx  Name Route Sig Dispense Refill  . FUROSEMIDE 20 MG PO TABS Oral Take 1 tablet (20 mg total) by mouth 2 (two) times daily. 30 tablet 0  . TRIAMTERENE-HCTZ 37.5-25 MG PO  TABS Oral Take 1 tablet by mouth daily.    . FUROSEMIDE 20 MG PO TABS Oral Take 1 tablet (20 mg total) by mouth daily as needed (for leg swelling). 5 tablet 0  . NAPROXEN 500 MG PO TABS Oral Take 500 mg by mouth 2 (two) times daily with a meal.      BP 166/87  Pulse 62  Temp 98.2 F (36.8 C) (Oral)  Resp 18  SpO2 100%  LMP 11/06/2011  Physical Exam  Constitutional: She is oriented to person, place, and time. She appears well-developed and well-nourished. No distress.  HENT:  Head: Normocephalic and atraumatic.  Mouth/Throat: Oropharynx is clear and moist. No oropharyngeal exudate.  Eyes: EOM are normal. Pupils are equal, round, and reactive to light.  Neck: Normal range of motion. Neck supple.  Cardiovascular: Normal rate and normal heart sounds.   Pulmonary/Chest: Effort normal and breath sounds normal. No respiratory distress. She has no wheezes. She has no rales.  Abdominal: Soft. There is no tenderness.  Musculoskeletal: Normal range of motion. She exhibits edema and tenderness.       Tenderness primarily to the calf muscles. She is also tender in the thighs she and and feet. There is trace to 1 pitting edema in both lower extremities.  Neurological: She is alert and oriented to person, place, and time. No cranial nerve deficit.  Skin: Skin is warm and dry.  Psychiatric: She has a normal mood and affect.    ED Course  Procedures (including critical care time)   Labs Reviewed  POCT I-STAT, CHEM 8   No results found.   1. Cramps, muscle, general       MDM   Results for orders placed during the hospital encounter of 11/12/11  POCT I-STAT, CHEM 8      Component Value Range   Sodium 140  135 - 145 mEq/L   Potassium 3.7  3.5 - 5.1 mEq/L   Chloride 101  96 - 112 mEq/L   BUN 12  6 - 23 mg/dL   Creatinine, Ser 1.61  0.50 - 1.10 mg/dL   Glucose, Bld 95  70 - 99 mg/dL   Calcium, Ion 0.96  0.45 - 1.23 mmol/L   TCO2 28  0 - 100 mmol/L   Hemoglobin 13.6  12.0 - 15.0  g/dL   HCT 40.9  81.1 - 91.4 %    Were compression stockings low tension both legs during the day. May drink a half a glass of tonic water each night. As needed for cramps. Stretching calves and quadriceps is demonstrated in the room. OTC low-dose magnesium capsules once a day, ask pharmacy for assistance in obtaining low-dose magnesium. [facility] this month.         Hayden Rasmussen, NP 11/12/11 1335

## 2011-11-12 NOTE — ED Notes (Signed)
Pt c/o bilateral leg cramps x3 months... Keeps her up all night... Denies: fevers, vomiting, nausea, diarrhea... Used to go to [facility] but due to their closure has not seen a doctor.

## 2011-11-15 ENCOUNTER — Ambulatory Visit: Payer: 59 | Admitting: Family Medicine

## 2011-11-29 ENCOUNTER — Ambulatory Visit: Payer: Self-pay | Admitting: Family Medicine

## 2011-12-01 ENCOUNTER — Ambulatory Visit: Payer: Self-pay | Admitting: Family Medicine

## 2011-12-09 ENCOUNTER — Ambulatory Visit: Payer: Self-pay | Admitting: Family Medicine

## 2012-01-05 ENCOUNTER — Ambulatory Visit: Payer: Self-pay | Admitting: Family Medicine

## 2012-04-28 ENCOUNTER — Emergency Department (INDEPENDENT_AMBULATORY_CARE_PROVIDER_SITE_OTHER)
Admission: EM | Admit: 2012-04-28 | Discharge: 2012-04-28 | Disposition: A | Discharge status: Home / Self Care | Payer: Medicaid Other | Source: Home / Self Care | Attending: Emergency Medicine | Admitting: Emergency Medicine

## 2012-04-28 ENCOUNTER — Encounter (HOSPITAL_COMMUNITY): Payer: Self-pay | Admitting: Emergency Medicine

## 2012-04-28 DIAGNOSIS — K1120 Sialoadenitis, unspecified: Secondary | ICD-10-CM

## 2012-04-28 MED ORDER — PENICILLIN V POTASSIUM 500 MG PO TABS: 500.0000 mg | ORAL_TABLET | Freq: Three times a day (TID) | ORAL | Status: AC

## 2012-04-28 NOTE — ED Provider Notes (Signed)
History     CSN: [id number]  Arrival date & time 04/28/12  1052   First MD Initiated Contact with Patient 04/28/12 1124      Chief Complaint  Patient presents with  . Dental Pain  . Oral Swelling    (Consider location/radiation/quality/duration/timing/severity/associated sxs/prior treatment) HPI Comments: Patient presents urgent care describing that since yesterday after she ate a candy with peanuts chocolate she suddenly developed a swelling and soreness underneath her left lower mandible angle. Since yesterday the swelling and tenderness in the area is somewhat increased. She is only taking naproxen for it as she usually takes it for her arthritis. Because of the discomfort and swelling. Patient denies any dental pain in the last few days. She denies any difficulty swallowing, fevers, or further facial swelling beyond the lower aspect of her left mandible ridge. Patient denies any headache or neurological symptoms. No rashes.  Patient is a 47 y.o. female presenting with tooth pain. The history is provided by the patient.  Dental PainPrimary symptoms do not include dental injury, oral bleeding, oral lesions, fever, sore throat, angioedema or cough. The symptoms began yesterday. The symptoms are worsening. The symptoms are new.  Additional symptoms include: facial swelling, dry mouth and swollen glands. Additional symptoms do not include: dental sensitivity to temperature, gum swelling, gum tenderness, purulent gums, trismus, trouble swallowing, pain with swallowing, taste disturbance, ear pain, hearing loss and goiter. Medical issues include: periodontal disease. Medical issues do not include: alcohol problem, smoking, chewing tobacco, immunosuppression and cancer.    Past Medical History  Diagnosis Date  . Edema leg   . Hypertension     History reviewed. No pertinent past surgical history.  History reviewed. No pertinent family history.  History  Substance Use Topics  . Smoking  status: Current Every Day Smoker -- 0.25 packs/day    Types: Cigarettes  . Smokeless tobacco: Not on file  . Alcohol Use: No    OB History   Grav Para Term Preterm Abortions TAB SAB Ect Mult Living                  Review of Systems  Constitutional: Negative for fever, activity change and appetite change.  HENT: Positive for facial swelling. Negative for hearing loss, ear pain, congestion, sore throat, trouble swallowing, neck pain, neck stiffness, dental problem, postnasal drip, sinus pressure, tinnitus and ear discharge.   Eyes: Negative for pain and visual disturbance.  Respiratory: Negative for cough.   Musculoskeletal: Negative for back pain and arthralgias.    Allergies  Cortisone  Home Medications   Current Outpatient Rx  Name  Route  Sig  Dispense  Refill  . furosemide (LASIX) 20 MG tablet   Oral   Take 1 tablet (20 mg total) by mouth daily as needed (for leg swelling).   5 tablet   0   . naproxen (NAPROSYN) 500 MG tablet   Oral   Take 500 mg by mouth 2 (two) times daily with a meal.         . furosemide (LASIX) 20 MG tablet   Oral   Take 1 tablet (20 mg total) by mouth 2 (two) times daily.   30 tablet   0   . penicillin v potassium (VEETID) 500 MG tablet   Oral   Take 1 tablet (500 mg total) by mouth 3 (three) times daily.   30 tablet   0   . triamterene-hydrochlorothiazide (MAXZIDE-25) 37.5-25 MG per tablet   Oral   Take  1 tablet by mouth daily.           BP 159/94  Pulse 55  Temp(Src) 98.3 F (36.8 C) (Oral)  Resp 18  SpO2 100%  LMP 03/25/2012  Physical Exam  Vitals reviewed. Constitutional: She appears well-developed and well-nourished.  HENT:  Head: Normocephalic and atraumatic. No trismus in the jaw.    Mouth/Throat: Oropharynx is clear and moist and mucous membranes are normal. No oral lesions. Abnormal dentition. Dental caries present. No dental abscesses, edematous or lacerations. No oropharyngeal exudate.    Eyes:  Conjunctivae are normal. Pupils are equal, round, and reactive to light.  Neck: Normal range of motion. No JVD present. No tracheal deviation present. No thyromegaly present.  Lymphadenopathy:    She has no cervical adenopathy.  Neurological: She is alert.  Skin: No rash noted. No erythema. No pallor.    ED Course  Procedures (including critical care time)  Labs Reviewed - No data to display No results found.   1. Sialadenitis       MDM  Left submandibular suspected sialadenitis. Patient has been started on penicillin and advised- to return if no improvement in the next 24-48 hours. No obvious dental periapical abscess but cannot be fully rule out that this localize infection as odontogenic in origin. Patient has been instructed in ways to increase salivation as well. Have encouraged her to followup with [place] if no resolution after 5-7 days to consider further evaluation and even perhaps an ENT referral. Patient agrees with treatment plan and followup care as discussed.       Jimmie Molly, MD 04/28/12 [phone]

## 2012-04-28 NOTE — ED Notes (Signed)
Pt c/o dental pain and lymph node swelling since yest Recalls eating cand (peanut w/choclate) Sx include: swelling and pain Denies: f/v/n/d Took Naproxen 500mg  for her arthritis today  She is alert and oriented w/no signs of acute distress.

## 2012-07-07 ENCOUNTER — Ambulatory Visit: Payer: Medicaid Other

## 2012-07-21 ENCOUNTER — Ambulatory Visit: Payer: Medicaid Other

## 2012-07-28 ENCOUNTER — Ambulatory Visit: Payer: Medicaid Other

## 2012-08-03 ENCOUNTER — Emergency Department (INDEPENDENT_AMBULATORY_CARE_PROVIDER_SITE_OTHER)
Admission: EM | Admit: 2012-08-03 | Discharge: 2012-08-03 | Disposition: A | Discharge status: Home / Self Care | Payer: Self-pay | Source: Home / Self Care | Attending: Emergency Medicine | Admitting: Emergency Medicine

## 2012-08-03 ENCOUNTER — Encounter (HOSPITAL_COMMUNITY): Payer: Self-pay | Admitting: *Deleted

## 2012-08-03 DIAGNOSIS — R42 Dizziness and giddiness: Secondary | ICD-10-CM

## 2012-08-03 LAB — POCT I-STAT, CHEM 8
BUN: 18 mg/dL (ref 6–23)
Chloride: 99 mEq/L (ref 96–112)
Creatinine, Ser: 1.1 mg/dL (ref 0.50–1.10)
Hemoglobin: 14.6 g/dL (ref 12.0–15.0)
Potassium: 4.1 mEq/L (ref 3.5–5.1)
Sodium: 140 mEq/L (ref 135–145)

## 2012-08-03 NOTE — ED Notes (Signed)
Pt  Ambulated  To  Room  With a  Steady  Fluid  Gait    She  Reports  Symptoms  Of  Dizzy        X  1  Month    Worse  Over  Last  sev  Days   Denys  Any pain     Alert  And  Oriented    Speaking  In  Clear    sentances   Has     No  pcp

## 2012-08-03 NOTE — ED Provider Notes (Signed)
History    CSN: [id number] Arrival date & time 08/03/12  1113  First MD Initiated Contact with Patient 08/03/12 1217     Chief Complaint  Patient presents with  . Dizziness   (Consider location/radiation/quality/duration/timing/severity/associated sxs/prior Treatment) HPI Comments: Patient presents urgent care describing that she's been feeling dizzy on and off for about a month. Somewhat tired and fatigued. She denies any shortness of breath or chest pains denies any lower extremity swelling. Patient still has not established a primary care Dr. and also is requesting to be treated for high cholesterol. She has about 4 bottles of different pills which- 2 of them were Lasix which were empty and she's also taking a multivitamin product. With the other documented medicines.  She recognizes that she has a history of hypertension and continues to smoke. Patient denies any headaches, further neurological symptoms when asked such as weakness, numbness or tingling sensation to her upper or lower extremities. She denies any further symptoms  The history is provided by the patient.   Past Medical History  Diagnosis Date  . Edema leg   . Hypertension    History reviewed. No pertinent past surgical history. History reviewed. No pertinent family history. History  Substance Use Topics  . Smoking status: Current Every Day Smoker -- 0.25 packs/day    Types: Cigarettes  . Smokeless tobacco: Not on file  . Alcohol Use: No   OB History   Grav Para Term Preterm Abortions TAB SAB Ect Mult Living                 Review of Systems  Constitutional: Positive for chills. Negative for activity change and appetite change.  Respiratory: Negative for cough and shortness of breath.   Cardiovascular: Negative for chest pain, palpitations and leg swelling.  Musculoskeletal: Negative for myalgias and arthralgias.  Skin: Negative for color change, pallor and rash.  Neurological: Positive for dizziness.  Negative for seizures, weakness, numbness and headaches.    Allergies  Cortisone  Home Medications   Current Outpatient Rx  Name  Route  Sig  Dispense  Refill  . lisinopril-hydrochlorothiazide (PRINZIDE,ZESTORETIC) 20-25 MG per tablet   Oral   Take 1 tablet by mouth daily.         . meloxicam (MOBIC) 15 MG tablet   Oral   Take 15 mg by mouth daily.         Marland Kitchen omeprazole (PRILOSEC OTC) 20 MG tablet   Oral   Take 20 mg by mouth daily.          BP 145/99  Pulse 58  Temp(Src) 98 F (36.7 C) (Oral)  Resp 18  SpO2 99% Physical Exam  Nursing note and vitals reviewed. Constitutional: She is oriented to person, place, and time. She appears well-nourished.  Non-toxic appearance. She does not have a sickly appearance. She does not appear ill.  HENT:  Head: Normocephalic.  Eyes: Pupils are equal, round, and reactive to light.  Neck: Neck supple. No JVD present.  Cardiovascular: Regular rhythm, normal heart sounds and normal pulses.  Bradycardia present.   Obtained a 6O SECOND rhythm strip with a handheld device. No obvious PR interval prolongation, no QRS elongation no ST or T wave inversion or elevation to suggest acute ischemia. No evidence of a intraventricular block  Pulmonary/Chest: Effort normal and breath sounds normal.  Abdominal: Soft. Bowel sounds are normal.  Musculoskeletal: Normal range of motion.  Lymphadenopathy:    She has no cervical adenopathy.  Neurological: She  is alert and oriented to person, place, and time. She displays normal reflexes. No cranial nerve deficit or sensory deficit. She exhibits normal muscle tone. She displays no seizure activity. Coordination normal.  Skin: Skin is warm. No rash noted. No erythema.    ED Course  Procedures (including critical care time) Labs Reviewed  POCT I-STAT, CHEM 8   No results found. 1. Dizziness     MDM  Recurrent dizziness.- Z. of electrolytes within normal. H&H within normal. Unknown etiology.  Patient has been- instructed with her primary care Dr. and was provided verbal and written information to establish with [facility]. Have advised her about symptoms and will require further evaluation in the emergency department. She agrees and understands the treatment plan and followup care.  Jimmie Molly, MD 08/03/12 [phone]

## 2012-08-09 ENCOUNTER — Ambulatory Visit: Payer: Medicaid Other | Attending: Family Medicine | Admitting: Internal Medicine

## 2012-08-09 ENCOUNTER — Encounter: Payer: Self-pay | Admitting: Internal Medicine

## 2012-08-09 VITALS — BP 148/95 | HR 61 | Temp 98.0°F | Resp 16 | Ht 67.0 in | Wt 229.6 lb

## 2012-08-09 DIAGNOSIS — Z01419 Encounter for gynecological examination (general) (routine) without abnormal findings: Secondary | ICD-10-CM

## 2012-08-09 DIAGNOSIS — I10 Essential (primary) hypertension: Secondary | ICD-10-CM | POA: Insufficient documentation

## 2012-08-09 MED ORDER — LISINOPRIL-HYDROCHLOROTHIAZIDE 20-25 MG PO TABS: 1.0000 | ORAL_TABLET | ORAL | Status: DC

## 2012-08-09 NOTE — Patient Instructions (Signed)

## 2012-08-09 NOTE — Progress Notes (Signed)
PT HERE TO ESTABLISH CARE FOR HX HTN AND NEED CHOLESTEROL MEDICATION. PT STOP TAKING LISINOPRIL-HCTZ TODAY DUE TO DIZZINESS.

## 2012-08-09 NOTE — Progress Notes (Signed)
Patient ID: Sarah Macdonald, female   DOB: 1965-04-07, 47 y.o.   MRN: [id number]   CC: follow up  HPI: Pt is 47 yo female who comes in for follow up and requesting review of BP medications. She denies chest pain or shortness of breath or abdominal concerns, no fevers, chills, no other systemic symptoms.   Allergies  Allergen Reactions  . Cortisone Swelling   Past Medical History  Diagnosis Date  . Edema leg   . Hypertension   . Hyperlipidemia    Current Outpatient Prescriptions on File Prior to Visit  Medication Sig Dispense Refill  . meloxicam (MOBIC) 15 MG tablet Take 15 mg by mouth daily.      Marland Kitchen omeprazole (PRILOSEC OTC) 20 MG tablet Take 20 mg by mouth daily.       No current facility-administered medications on file prior to visit.    No family history of cancers.  History   Social History  . Marital Status: Single    Spouse Name: N/A    Number of Children: N/A  . Years of Education: N/A   Occupational History  . Not on file.   Social History Main Topics  . Smoking status: Former Smoker -- 0.25 packs/day    Types: Cigarettes    Quit date: 01/25/2012  . Smokeless tobacco: Not on file  . Alcohol Use: No  . Drug Use: No  . Sexually Active: Not Currently   Other Topics Concern  . Not on file   Social History Narrative  . No narrative on file    Review of Systems  Constitutional: Negative for fever, chills, diaphoresis, activity change, appetite change and fatigue.  HENT: Negative for ear pain, nosebleeds, congestion, facial swelling, rhinorrhea, neck pain, neck stiffness and ear discharge.   Eyes: Negative for pain, discharge, redness, itching and visual disturbance.  Respiratory: Negative for cough, choking, chest tightness, shortness of breath, wheezing and stridor.   Cardiovascular: Negative for chest pain, palpitations and leg swelling.  Gastrointestinal: Negative for abdominal distention.  Genitourinary: Negative for dysuria, urgency, frequency,  hematuria, flank pain, decreased urine volume, difficulty urinating and dyspareunia.  Musculoskeletal: Negative for back pain, joint swelling, arthralgias and gait problem.  Neurological: Negative for dizziness, tremors, seizures, syncope, facial asymmetry, speech difficulty, weakness, light-headedness, numbness and headaches.  Hematological: Negative for adenopathy. Does not bruise/bleed easily.  Psychiatric/Behavioral: Negative for hallucinations, behavioral problems, confusion, dysphoric mood, decreased concentration and agitation.    Objective:   Filed Vitals:   08/09/12 1343  BP: 148/95  Pulse: 61  Temp: 98 F (36.7 C)  Resp: 16    Physical Exam  Constitutional: Appears well-developed and well-nourished. No distress.  HENT: Normocephalic. External right and left ear normal. Oropharynx is clear and moist.  Eyes: Conjunctivae and EOM are normal. PERRLA, no scleral icterus.  Neck: Normal ROM. Neck supple. No JVD. No tracheal deviation. No thyromegaly.  CVS: RRR, S1/S2 +, no murmurs, no gallops, no carotid bruit.  Pulmonary: Effort and breath sounds normal, no stridor, rhonchi, wheezes, rales.  Abdominal: Soft. BS +,  no distension, tenderness, rebound or guarding.  Musculoskeletal: Normal range of motion. No edema and no tenderness.  Lymphadenopathy: No lymphadenopathy noted, cervical, inguinal. Neuro: Alert. Normal reflexes, muscle tone coordination. No cranial nerve deficit. Skin: Skin is warm and dry. No rash noted. Not diaphoretic. No erythema. No pallor.  Psychiatric: Normal mood and affect. Behavior, judgment, thought content normal.   Lab Results  Component Value Date   WBC 5.3 08/02/2007  HGB 14.6 08/03/2012   HCT 43.0 08/03/2012   MCV 94.5 08/02/2007   PLT 346 08/02/2007   Lab Results  Component Value Date   CREATININE 1.10 08/03/2012   BUN 18 08/03/2012   NA 140 08/03/2012   K 4.1 08/03/2012   CL 99 08/03/2012   CO2 28 10/28/2011    No results found for this  basename: HGBA1C   Lipid Panel     Component Value Date/Time   CHOL 180 08/02/2007 2030   TRIG 53 08/02/2007 2030   HDL 47 08/02/2007 2030   CHOLHDL 3.8 Ratio 08/02/2007 2030   VLDL 11 08/02/2007 2030   LDLCALC 122* 08/02/2007 2030       Assessment and plan:    HTN - discussed target BP range and pt advised to take BO regularly, she has been taking medication every other day and is trying to loose weight and eat healthier. She wants to give this a try and if her BP is still elevated she will consider taking medication every day. I think that is reasonable agreement. We have discussed medical compliance, checking NP regularly and calling us back if the numbers are higher > 140/90.

## 2012-08-18 ENCOUNTER — Ambulatory Visit: Payer: Medicaid Other | Attending: Family Medicine | Admitting: Internal Medicine

## 2012-08-18 ENCOUNTER — Encounter: Payer: Self-pay | Admitting: Internal Medicine

## 2012-08-18 VITALS — BP 126/86 | HR 59 | Temp 98.4°F | Resp 16 | Ht 67.0 in | Wt 233.0 lb

## 2012-08-18 DIAGNOSIS — I10 Essential (primary) hypertension: Secondary | ICD-10-CM | POA: Insufficient documentation

## 2012-08-18 DIAGNOSIS — R42 Dizziness and giddiness: Secondary | ICD-10-CM | POA: Insufficient documentation

## 2012-08-18 DIAGNOSIS — E785 Hyperlipidemia, unspecified: Secondary | ICD-10-CM | POA: Insufficient documentation

## 2012-08-18 DIAGNOSIS — I951 Orthostatic hypotension: Secondary | ICD-10-CM | POA: Insufficient documentation

## 2012-08-18 MED ORDER — METOPROLOL TARTRATE 25 MG PO TABS: 25.0000 mg | ORAL_TABLET | Freq: Two times a day (BID) | ORAL | Status: DC

## 2012-08-18 NOTE — Addendum Note (Signed)
Addended by: Leroy Sea on: 08/18/2012 04:38 PM   Modules accepted: Orders

## 2012-08-18 NOTE — Progress Notes (Signed)
Patient states that she wants cholesterol med because she states she is dizzy in the mornings and her cholesterol must be high.

## 2012-08-18 NOTE — Progress Notes (Signed)
Patient ID: Sarah Macdonald, female   DOB: May 31, 1965, 47 y.o.   MRN: [id number] Patient Demographics  Sarah Macdonald, is a 47 y.o. female  WUJ:[id number]  [id number]  DOB - [birth year]  No chief complaint on file.       Subjective:   Sarah Macdonald with History of hypertension is here for mild dizziness in the morning when she wakes up, it then goes away, she says she feels lightheaded when she wakes up and walks too fast in the morning, this has happened for the last 1 month since they were started on combination of HCTZ and lisinopril.  Denies any subjective complaints except as above, no active headache, no chest abdominal pain at this time, not short of breath. No focal weakness which is new.   Objective:    Patient Active Problem List   Diagnosis Date Noted  . Dyslipidemia 08/18/2012  . HTN (hypertension) 08/18/2012     Filed Vitals:   08/18/12 1223  BP: 126/86  Pulse: 59  Temp: 98.4 F (36.9 C)  TempSrc: Oral  Resp: 16  Height: 5\' 7"  (1.702 m)  Weight: 233 lb (105.688 kg)  SpO2: 95%     Exam   Awake Alert, Oriented X 3, No new F.N deficits, Normal affect Lane.AT,PERRAL Supple Neck,No JVD, No cervical lymphadenopathy appriciated.  Symmetrical Chest wall movement, Good air movement bilaterally, CTAB RRR,No Gallops,Rubs or new Murmurs, No Parasternal Heave +ve B.Sounds, Abd Soft, Non tender, No organomegaly appriciated, No rebound - guarding or rigidity. No Cyanosis, Clubbing or edema, No new Rash or bruise      Data Review   CBC No results found for this basename: WBC, HGB, HCT, PLT, MCV, MCH, MCHC, RDW, NEUTRABS, LYMPHSABS, MONOABS, EOSABS, BASOSABS, BANDABS, BANDSABD,  in the last 168 hours  Chemistries   No results found for this basename: NA, K, CL, CO2, GLUCOSE, BUN, CREATININE, GFRCGP, CALCIUM, MG, AST, ALT, ALKPHOS, BILITOT,  in the last 168  hours ------------------------------------------------------------------------------------------------------------------ No results found for this basename: HGBA1C,  in the last 72 hours ------------------------------------------------------------------------------------------------------------------ No results found for this basename: CHOL, HDL, LDLCALC, TRIG, CHOLHDL, LDLDIRECT,  in the last 72 hours ------------------------------------------------------------------------------------------------------------------ No results found for this basename: TSH, T4TOTAL, FREET3, T3FREE, THYROIDAB,  in the last 72 hours ------------------------------------------------------------------------------------------------------------------ No results found for this basename: VITAMINB12, FOLATE, FERRITIN, TIBC, IRON, RETICCTPCT,  in the last 72 hours  Coagulation profile  No results found for this basename: INR, PROTIME,  in the last 168 hours     Prior to Admission medications   Medication Sig Start Date End Date Taking? Authorizing Provider  meloxicam (MOBIC) 15 MG tablet Take 15 mg by mouth daily.   Yes Historical Provider, MD  omeprazole (PRILOSEC OTC) 20 MG tablet Take 20 mg by mouth daily.   Yes Historical Provider, MD  metoprolol (LOPRESSOR) 25 MG tablet Take 1 tablet (25 mg total) by mouth 2 (two) times daily. 08/18/12   Leroy Sea, MD     Assessment & Plan   Dizziness in the morning when she wakes up for the last 1 month since she had been on combination of HCTZ and lisinopril. This is postural hypotension in the morning, gave her instructions to keep herself hydrated, switched her blood pressure medication to Lopressor, gave her instructions about dizziness -   You must sit at a stationary position for 5 minutes and do leg extension exercises as taught for 5 minutes before you start walking from a resting position or after getting up from  a bed, once you stand up , stand at that spot for  3-5 minutes while holding on to a wall-bed-heavy furniture and then walk only if you are not dizzy, using a  walker at all times, if you still get dizzy sit down, and call for help.  She will come back in a month.   Routine health screening   Mammogram referral made, Pap smear - he just had 2 months ago at [facility] and was told it was unremarkable  Immunizations needs tetanus shot next visit we're out of tetanus shot       Leroy Sea M.D on 08/18/2012 at 12:55 PM

## 2012-09-04 ENCOUNTER — Telehealth: Payer: Self-pay | Admitting: Family Medicine

## 2012-09-04 NOTE — Telephone Encounter (Signed)
Pt says feels dizzy in the morning even after eating breakfast and meds.  Would like advice as to why this is.

## 2012-09-05 ENCOUNTER — Telehealth: Payer: Self-pay | Admitting: *Deleted

## 2012-09-05 NOTE — Telephone Encounter (Signed)
09/05/12 Unable to reach patient . P.Mayford Knife, RN BSN [facility]

## 2012-09-11 ENCOUNTER — Ambulatory Visit: Payer: Medicaid Other

## 2012-09-19 ENCOUNTER — Encounter: Payer: Self-pay | Admitting: Internal Medicine

## 2012-09-19 ENCOUNTER — Ambulatory Visit: Payer: Medicaid Other

## 2012-09-19 ENCOUNTER — Ambulatory Visit: Payer: Medicaid Other | Attending: Family Medicine | Admitting: Internal Medicine

## 2012-09-19 VITALS — BP 126/84 | HR 90 | Temp 98.0°F | Resp 17 | Wt 234.4 lb

## 2012-09-19 DIAGNOSIS — R42 Dizziness and giddiness: Secondary | ICD-10-CM

## 2012-09-19 DIAGNOSIS — I10 Essential (primary) hypertension: Secondary | ICD-10-CM

## 2012-09-19 MED ORDER — METOPROLOL TARTRATE 25 MG PO TABS: 25.0000 mg | ORAL_TABLET | Freq: Two times a day (BID) | ORAL | Status: DC

## 2012-09-19 MED ORDER — MECLIZINE HCL 32 MG PO TABS: 32.0000 mg | ORAL_TABLET | Freq: Three times a day (TID) | ORAL | Status: DC | PRN

## 2012-09-19 NOTE — Progress Notes (Signed)
Patient ID: Sarah Macdonald, female   DOB: January 25, 1966, 47 y.o.   MRN: [id number]  CC: FU on HTN  HPI: Sarah Macdonald is a 47 years old woman here today for follow up visit. Patient needs refill on Lopressor 25 mg. She takes it once daily.  Tolerating it well. Dizziness for the past few months, especially worse in the morning when she first gets up. Patient sits in bed for 45 minutes before symptoms resolve. Also experiences it after taking her BP med. Denies headache, room spinning sensation, or any ear problems. Instructed to call back after a week to see if any changes with medication. She also complaint of Amenorrhea for the past 9 months. She denies any pain or vaginal discharge.  Explained to patient that she might be peri-menopausal. Patient needs to come back for complete physical. Allergies  Allergen Reactions  . Cortisone Swelling   Past Medical History  Diagnosis Date  . Edema leg   . Hypertension   . Hyperlipidemia    Current Outpatient Prescriptions on File Prior to Visit  Medication Sig Dispense Refill  . meloxicam (MOBIC) 15 MG tablet Take 15 mg by mouth daily.      Marland Kitchen omeprazole (PRILOSEC OTC) 20 MG tablet Take 20 mg by mouth daily.       No current facility-administered medications on file prior to visit.   History reviewed. No pertinent family history. History   Social History  . Marital Status: Single    Spouse Name: N/A    Number of Children: N/A  . Years of Education: N/A   Occupational History  . Not on file.   Social History Main Topics  . Smoking status: Former Smoker -- 0.25 packs/day    Types: Cigarettes    Quit date: 01/25/2012  . Smokeless tobacco: Not on file  . Alcohol Use: No  . Drug Use: No  . Sexually Active: Not Currently   Other Topics Concern  . Not on file   Social History Narrative  . No narrative on file    Review of Systems Constitutional: Negative for fever, chills, diaphoresis, activity change, appetite change and fatigue.  ____ HENT: Negative for ear pain, nosebleeds, congestion, facial swelling, rhinorrhea, neck pain, neck stiffness and ear discharge.  ____ Eyes: Negative for pain, discharge, redness, itching and visual disturbance. ____ Respiratory: Negative for cough, choking, chest tightness, shortness of breath, wheezing and stridor. Cardiovascular: Negative for chest pain, palpitations and leg swelling. ____ Gastrointestinal: Negative for abdominal distention. ____ Genitourinary: Negative for dysuria, urgency, frequency, hematuria, flank pain, decreased urine volume, difficulty urinating and dyspareunia. ____ Musculoskeletal: Negative for back pain, joint swelling, arthralgias and gait problem. ________ Neurological: Negative for dizziness, tremors, seizures, syncope, facial asymmetry, speech difficulty, weakness, light-headedness, numbness and headaches. ____ Hematological: Negative for adenopathy. Does not bruise/bleed easily. ____ Psychiatric/Behavioral: Negative for hallucinations, behavioral problems, confusion, dysphoric mood, decreased concentration and agitation. ______   Objective:   Filed Vitals:   09/19/12 1252  BP: 126/84  Pulse: 90  Temp:   Resp:    No orthostatic change in blood pressure or pulse rate  Physical Exam  Constitutional: Appears well-developed and well-nourished. No distress. ____ HENT: Normocephalic. External right and left ear normal. Oropharynx is clear and moist. ____ Eyes: Conjunctivae and EOM are normal. PERRLA, no scleral icterus. ____ Neck: Normal ROM. Neck supple. No JVD. No tracheal deviation. No thyromegaly. ____ CVS: RRR, S1/S2 +, no murmurs, no gallops, no carotid bruit.  Pulmonary: Effort and breath sounds normal,  no stridor, rhonchi, wheezes, rales.  Abdominal: Soft. BS +,  no distension, tenderness, rebound or guarding. ________ Musculoskeletal: Normal range of motion. No edema and no tenderness. ____ Lymphadenopathy: No lymphadenopathy noted, cervical,  inguinal. Neuro: Alert. Normal reflexes, muscle tone coordination. No cranial nerve deficit. Skin: Skin is warm and dry. No rash noted. Not diaphoretic. No erythema. No pallor. ____ Psychiatric: Normal mood and affect. Behavior, judgment, thought content normal. __  Lab Results  Component Value Date   WBC 5.3 08/02/2007   HGB 14.6 08/03/2012   HCT 43.0 08/03/2012   MCV 94.5 08/02/2007   PLT 346 08/02/2007   Lab Results  Component Value Date   CREATININE 1.10 08/03/2012   BUN 18 08/03/2012   NA 140 08/03/2012   K 4.1 08/03/2012   CL 99 08/03/2012   CO2 28 10/28/2011    No results found for this basename: HGBA1C   Lipid Panel     Component Value Date/Time   CHOL 180 08/02/2007 2030   TRIG 53 08/02/2007 2030   HDL 47 08/02/2007 2030   CHOLHDL 3.8 Ratio 08/02/2007 2030   VLDL 11 08/02/2007 2030   LDLCALC 122* 08/02/2007 2030       Assessment and plan:   Patient Active Problem List   Diagnosis Date Noted  . Dizziness of unknown cause 09/19/2012  . Dyslipidemia 08/18/2012  . HTN (hypertension) 08/18/2012   Blood pressure medication was refilled A trial of meclizine 32 mg tablet taken as needed for dizziness Blood pressure monitor ambulatory. Patient to return in one week for followup  Patient instructed to seat at the bedside for a few minutes before standing up every morning  Sarah Macdonald was given clear instructions to go to ER or return to the clinic if symptoms don't improve, worsen or new problems develop.  Sarah Macdonald verbalized understanding.  Sarah Macdonald was told to call to get lab results if hasn't heard anything in the next week.       Jeanann Lewandowsky, MD

## 2012-09-19 NOTE — Progress Notes (Signed)
Patient here for follow up\ States is not taking her medication because it makes her feel dizzy

## 2012-09-20 ENCOUNTER — Encounter: Payer: Self-pay | Admitting: Internal Medicine

## 2012-09-20 ENCOUNTER — Telehealth: Payer: Self-pay | Admitting: Family Medicine

## 2012-09-20 NOTE — Telephone Encounter (Deleted)
Pt needs a refill on her BP medication; pt has two medications left;

## 2012-09-20 NOTE — Telephone Encounter (Deleted)
Pt would like to get advice on medication;

## 2012-09-20 NOTE — Telephone Encounter (Signed)
Pt would like to be contacted regarding "something personal"

## 2012-09-20 NOTE — Telephone Encounter (Signed)
09/20/12 Unable to reach patient at numbers [phone] and [phone] P.[facility] BSN [facility]

## 2012-09-21 ENCOUNTER — Encounter: Payer: Medicaid Other | Admitting: Obstetrics & Gynecology

## 2012-09-21 ENCOUNTER — Ambulatory Visit: Payer: Medicaid Other

## 2012-10-02 ENCOUNTER — Other Ambulatory Visit: Payer: Self-pay | Admitting: Internal Medicine

## 2012-10-02 ENCOUNTER — Telehealth: Payer: Self-pay | Admitting: Family Medicine

## 2012-10-02 DIAGNOSIS — R42 Dizziness and giddiness: Secondary | ICD-10-CM

## 2012-10-02 MED ORDER — MECLIZINE HCL 25 MG PO TABS: 25.0000 mg | ORAL_TABLET | Freq: Three times a day (TID) | ORAL | Status: DC | PRN

## 2012-10-02 NOTE — Telephone Encounter (Signed)
Pharmacy calling about script for meclizine (ANTIVERT) 32 MG tablet, it only comes in 12.5 or 25MG .  Pharmacy trying to change to 25 MG, please change dosage.

## 2012-10-02 NOTE — Telephone Encounter (Signed)
Prescription has been corrected. Patient can go pick it up from the pharmacy.

## 2012-10-17 ENCOUNTER — Telehealth: Payer: Self-pay | Admitting: Internal Medicine

## 2012-10-17 NOTE — Telephone Encounter (Signed)
Pt calling to ask whether referrals to eye Dr or dentist were sent.  Pt has appt this fri, 9/12.

## 2012-10-20 ENCOUNTER — Ambulatory Visit: Payer: Medicaid Other | Attending: Internal Medicine | Admitting: Internal Medicine

## 2012-10-20 ENCOUNTER — Encounter: Payer: Self-pay | Admitting: Internal Medicine

## 2012-10-20 VITALS — BP 127/83 | HR 49 | Temp 98.4°F | Resp 16 | Ht 67.0 in | Wt 229.0 lb

## 2012-10-20 DIAGNOSIS — I10 Essential (primary) hypertension: Secondary | ICD-10-CM | POA: Insufficient documentation

## 2012-10-20 DIAGNOSIS — E785 Hyperlipidemia, unspecified: Secondary | ICD-10-CM | POA: Insufficient documentation

## 2012-10-20 DIAGNOSIS — R42 Dizziness and giddiness: Secondary | ICD-10-CM | POA: Insufficient documentation

## 2012-10-20 DIAGNOSIS — Z79899 Other long term (current) drug therapy: Secondary | ICD-10-CM | POA: Insufficient documentation

## 2012-10-20 LAB — COMPREHENSIVE METABOLIC PANEL
ALT: 13 U/L (ref 0–35)
Albumin: 4.2 g/dL (ref 3.5–5.2)
CO2: 30 mEq/L (ref 19–32)
Chloride: 100 mEq/L (ref 96–112)
Glucose, Bld: 110 mg/dL — ABNORMAL HIGH (ref 70–99)
Potassium: 3.9 mEq/L (ref 3.5–5.3)
Sodium: 139 mEq/L (ref 135–145)
Total Protein: 7.4 g/dL (ref 6.0–8.3)

## 2012-10-20 LAB — CBC WITH DIFFERENTIAL/PLATELET
Basophils Relative: 0 % (ref 0–1)
Eosinophils Absolute: 0.1 10*3/uL (ref 0.0–0.7)
HCT: 34.8 % — ABNORMAL LOW (ref 36.0–46.0)
Hemoglobin: 11.6 g/dL — ABNORMAL LOW (ref 12.0–15.0)
Lymphs Abs: 1.9 10*3/uL (ref 0.7–4.0)
MCH: 30.1 pg (ref 26.0–34.0)
MCHC: 33.3 g/dL (ref 30.0–36.0)
Monocytes Absolute: 0.6 10*3/uL (ref 0.1–1.0)
Monocytes Relative: 10 % (ref 3–12)
Neutro Abs: 3.2 10*3/uL (ref 1.7–7.7)
RBC: 3.85 MIL/uL — ABNORMAL LOW (ref 3.87–5.11)

## 2012-10-20 LAB — LIPID PANEL
Cholesterol: 195 mg/dL (ref 0–200)
Triglycerides: 52 mg/dL (ref <150)

## 2012-10-20 MED ORDER — METOPROLOL TARTRATE 25 MG PO TABS: 12.5000 mg | ORAL_TABLET | Freq: Two times a day (BID) | ORAL | Status: DC

## 2012-10-20 MED ORDER — HYDROCOD POLST-CHLORPHEN POLST 10-8 MG/5ML PO LQCR: 5.0000 mL | Freq: Two times a day (BID) | ORAL | Status: DC | PRN

## 2012-10-20 NOTE — Progress Notes (Signed)
Patient ID: Sarah Macdonald, female   DOB: [birth year], 47 y.o.   MRN: [id number]  CC:  Followup  HPI: Patient is 47 year old female who presents to clinic for followup and was to have blood pressure checked. She reports checking blood pressure at home and her numbers are usually less than 100/80. She was soon as she can stop taking metoprolol. She reports otherwise compliance with her medicines. She denies chest pain or shortness of breath, no specific abdominal or urinary concerns, no recent sicknesses or hospitalizations.  Allergies  Allergen Reactions  . Cortisone Swelling   Past Medical History  Diagnosis Date  . Edema leg   . Hypertension   . Hyperlipidemia    Current Outpatient Prescriptions on File Prior to Visit  Medication Sig Dispense Refill  . meclizine (ANTIVERT) 25 MG tablet Take 1 tablet (25 mg total) by mouth 3 (three) times daily as needed for dizziness.  30 tablet  0  . meloxicam (MOBIC) 15 MG tablet Take 15 mg by mouth daily.      . metoprolol tartrate (LOPRESSOR) 25 MG tablet Take 1 tablet (25 mg total) by mouth 2 (two) times daily.  60 tablet  3  . omeprazole (PRILOSEC OTC) 20 MG tablet Take 20 mg by mouth daily.       No current facility-administered medications on file prior to visit.   No specific family medical history History   Social History  . Marital Status: Single    Spouse Name: N/A    Number of Children: N/A  . Years of Education: N/A   Occupational History  . Not on file.   Social History Main Topics  . Smoking status: Former Smoker -- 0.25 packs/day    Types: Cigarettes    Quit date: 01/25/2012  . Smokeless tobacco: Not on file  . Alcohol Use: No  . Drug Use: No  . Sexual Activity: Not Currently   Other Topics Concern  . Not on file   Social History Narrative  . No narrative on file    Review of Systems  Constitutional: Negative for fever, chills, diaphoresis, activity change, appetite change and fatigue.  HENT: Negative for  ear pain, nosebleeds, congestion, facial swelling, rhinorrhea, neck pain, neck stiffness and ear discharge.   Eyes: Negative for pain, discharge, redness, itching and visual disturbance.  Respiratory: Negative for cough, choking, chest tightness, shortness of breath, wheezing and stridor.   Cardiovascular: Negative for chest pain, palpitations and leg swelling.  Gastrointestinal: Negative for abdominal distention.  Genitourinary: Negative for dysuria, urgency, frequency, hematuria, flank pain, decreased urine volume, difficulty urinating and dyspareunia.  Musculoskeletal: Negative for back pain, joint swelling, arthralgias and gait problem.  Neurological: Negative for dizziness, tremors, seizures, syncope, facial asymmetry, speech difficulty, weakness, light-headedness, numbness and headaches.  Hematological: Negative for adenopathy. Does not bruise/bleed easily.  Psychiatric/Behavioral: Negative for hallucinations, behavioral problems, confusion, dysphoric mood, decreased concentration and agitation.    Objective:   Filed Vitals:   10/20/12 1227  BP: 127/83  Pulse: 49  Temp: 98.4 F (36.9 C)  Resp: 16    Physical Exam  Constitutional: Appears well-developed and well-nourished. No distress.  HENT: Normocephalic. External right and left ear normal. Oropharynx is clear and moist.  Eyes: Conjunctivae and EOM are normal. PERRLA, no scleral icterus.  Neck: Normal ROM. Neck supple. No JVD. No tracheal deviation. No thyromegaly.  CVS: RRR, S1/S2 +, no murmurs, no gallops, no carotid bruit.  Pulmonary: Effort and breath sounds normal, no stridor, rhonchi, wheezes, rales.  Abdominal: Soft. BS +,  no distension, tenderness, rebound or guarding.    Lab Results  Component Value Date   WBC 5.3 08/02/2007   HGB 14.6 08/03/2012   HCT 43.0 08/03/2012   MCV 94.5 08/02/2007   PLT 346 08/02/2007   Lab Results  Component Value Date   CREATININE 1.10 08/03/2012   BUN 18 08/03/2012   NA 140 08/03/2012    K 4.1 08/03/2012   CL 99 08/03/2012   CO2 28 10/28/2011    No results found for this basename: HGBA1C   Lipid Panel     Component Value Date/Time   CHOL 180 08/02/2007 2030   TRIG 53 08/02/2007 2030   HDL 47 08/02/2007 2030   CHOLHDL 3.8 Ratio 08/02/2007 2030   VLDL 11 08/02/2007 2030   LDLCALC 122* 08/02/2007 2030       Assessment and plan:   Patient Active Problem List   Diagnosis Date Noted  . Dizziness of unknown cause - now resolved  09/19/2012  . HTN (hypertension) - blood pressure looks good, I have reviewed log book that patient brought in with her to show me blood pressure control. Her numbers are less than 100/80. I have advised her to cut metoprolol dose in half so she can continue taking 12.5 mg tablet twice a day. I have advised her to continue checking blood pressure regularly and to come and see [place] if the numbers are persistently lower than 100/80.  08/18/2012

## 2012-10-20 NOTE — Progress Notes (Signed)
Pt is here to follow up on her new medications. She states that she still has dizzy spells. She quit smoking 6 months ago and is having coughing spells.

## 2012-10-23 ENCOUNTER — Telehealth: Payer: Self-pay

## 2012-10-23 NOTE — Telephone Encounter (Signed)
Patient called looking for lab results Dr Izola Price viewed and lab results given to patient

## 2012-10-25 ENCOUNTER — Telehealth: Payer: Self-pay | Admitting: Family Medicine

## 2012-10-25 NOTE — Telephone Encounter (Signed)
Meds: Pt says medications prescribed for dry cough came out to $71 and cannot pay that amount and Medicaid will not cover she would like a less expensive medication.    Advice: Pt wondering how much weight she needs to lose in order to stop taking blood pressure medication.  Says bp meds make her head and feet hot in temperature. Please f/u with pt when meds are ready and with advice about weight.

## 2012-10-26 ENCOUNTER — Other Ambulatory Visit: Payer: Self-pay | Admitting: Internal Medicine

## 2012-10-26 MED ORDER — GUAIFENESIN-DM 100-10 MG/5ML PO SYRP: 10.0000 mL | ORAL_SOLUTION | Freq: Three times a day (TID) | ORAL | Status: DC | PRN

## 2012-10-27 ENCOUNTER — Telehealth: Payer: Self-pay | Admitting: Emergency Medicine

## 2012-10-27 NOTE — Telephone Encounter (Signed)
LEFT A MESSAGE WITH FAMILY FOR PT TO PICK ROBITUSSIN SCRIPT FROM [facility]

## 2012-10-27 NOTE — Telephone Encounter (Signed)
Pt also would like to know if she needs to come in for f/u.

## 2012-10-27 NOTE — Telephone Encounter (Signed)
LEFT A MESSAGE WITH FAMILY FOR PT TO PICK SCRIPT FROM [facility]. INFORMED TO CALL BACK FOR F/U APPT.

## 2012-11-02 NOTE — Telephone Encounter (Signed)
Left a message for pt to pick script from pharmacy

## 2012-11-16 ENCOUNTER — Ambulatory Visit: Payer: Medicaid Other

## 2012-11-21 ENCOUNTER — Ambulatory Visit: Payer: Medicaid Other | Attending: Internal Medicine | Admitting: Internal Medicine

## 2012-11-21 VITALS — BP 151/87 | HR 67 | Temp 97.7°F | Resp 17

## 2012-11-21 DIAGNOSIS — I10 Essential (primary) hypertension: Secondary | ICD-10-CM

## 2012-11-21 DIAGNOSIS — R42 Dizziness and giddiness: Secondary | ICD-10-CM

## 2012-11-21 DIAGNOSIS — Z0000 Encounter for general adult medical examination without abnormal findings: Secondary | ICD-10-CM

## 2012-11-21 DIAGNOSIS — E785 Hyperlipidemia, unspecified: Secondary | ICD-10-CM

## 2012-11-21 DIAGNOSIS — K219 Gastro-esophageal reflux disease without esophagitis: Secondary | ICD-10-CM

## 2012-11-21 LAB — POCT GLYCOSYLATED HEMOGLOBIN (HGB A1C): Hemoglobin A1C: 6.1

## 2012-11-21 MED ORDER — METOPROLOL TARTRATE 25 MG PO TABS: 12.5000 mg | ORAL_TABLET | Freq: Two times a day (BID) | ORAL | Status: DC

## 2012-11-21 MED ORDER — BIOTENE DRY MOUTH MT LIQD: 15.0000 mL | OROMUCOSAL | Status: DC | PRN

## 2012-11-21 MED ORDER — DEXTROMETHORPHAN HBR 15 MG/5ML PO SYRP: 10.0000 mL | ORAL_SOLUTION | Freq: Three times a day (TID) | ORAL | Status: DC | PRN

## 2012-11-21 NOTE — Patient Instructions (Signed)

## 2012-11-21 NOTE — Progress Notes (Signed)
Patient here for bilateral knee pain and  Left elbow pain

## 2012-11-21 NOTE — Progress Notes (Signed)
Patient ID: Sarah Macdonald, female   DOB: [birth year], 47 y.o.   MRN: [id number]  CC: follow up  HPI: 47 year old female with past medical history of hypertension and diet-controlled dyslipidemia who presented to clinic for followup. Patient has no major current complaints. She reports not feeling dizzy at this time. She does say she tries to lose weight by exercising 30 minutes every other day and eating healthier. No chest pain or shortness of breath or palpitations. No abdominal pain, nausea or vomiting.  Allergies  Allergen Reactions  . Cortisone Swelling   Past Medical History  Diagnosis Date  . Edema leg   . Hypertension   . Hyperlipidemia    Current Outpatient Prescriptions on File Prior to Visit  Medication Sig Dispense Refill  . guaiFENesin-dextromethorphan (ROBITUSSIN DM) 100-10 MG/5ML syrup Take 10 mLs by mouth 3 (three) times daily as needed for cough.  118 mL  0  . meclizine (ANTIVERT) 25 MG tablet Take 1 tablet (25 mg total) by mouth 3 (three) times daily as needed for dizziness.  30 tablet  0  . meloxicam (MOBIC) 15 MG tablet Take 15 mg by mouth daily.      Marland Kitchen omeprazole (PRILOSEC OTC) 20 MG tablet Take 20 mg by mouth daily.       No current facility-administered medications on file prior to visit.   Family medical history significant for HTN, HLD  History   Social History  . Marital Status: Single    Spouse Name: N/A    Number of Children: N/A  . Years of Education: N/A   Occupational History  . Not on file.   Social History Main Topics  . Smoking status: Former Smoker -- 0.25 packs/day    Types: Cigarettes    Quit date: 01/25/2012  . Smokeless tobacco: Not on file  . Alcohol Use: No  . Drug Use: No  . Sexual Activity: Not Currently   Other Topics Concern  . Not on file   Social History Narrative  . No narrative on file    Review of Systems  Constitutional: Negative for fever, chills, diaphoresis, activity change, appetite change and fatigue.   HENT: Negative for ear pain, nosebleeds, congestion, facial swelling, rhinorrhea, neck pain, neck stiffness and ear discharge.   Eyes: Negative for pain, discharge, redness, itching and visual disturbance.  Respiratory: Negative for cough, choking, chest tightness, shortness of breath, wheezing and stridor.   Cardiovascular: Negative for chest pain, palpitations and leg swelling.  Gastrointestinal: Negative for abdominal distention.  Genitourinary: Negative for dysuria, urgency, frequency, hematuria, flank pain, decreased urine volume, difficulty urinating and dyspareunia.  Musculoskeletal: Negative for back pain, joint swelling, arthralgias and gait problem.  Neurological: Negative for dizziness, tremors, seizures, syncope, facial asymmetry, speech difficulty, weakness, light-headedness, numbness and headaches.  Hematological: Negative for adenopathy. Does not bruise/bleed easily.  Psychiatric/Behavioral: Negative for hallucinations, behavioral problems, confusion, dysphoric mood, decreased concentration and agitation.    Objective:   Filed Vitals:   11/21/12 1359  BP: 151/87  Pulse: 67  Temp: 97.7 F (36.5 C)  Resp: 17    Physical Exam  Constitutional: Appears well-developed and well-nourished. No distress.  HENT: Normocephalic. External right and left ear normal. Oropharynx is clear and moist.  Eyes: Conjunctivae and EOM are normal. PERRLA, no scleral icterus.  Neck: Normal ROM. Neck supple. No JVD. No tracheal deviation. No thyromegaly.  CVS: RRR, S1/S2 +, no murmurs, no gallops, no carotid bruit.  Pulmonary: Effort and breath sounds normal, no stridor, rhonchi, wheezes,  rales.  Abdominal: Soft. BS +,  no distension, tenderness, rebound or guarding.  Musculoskeletal: Normal range of motion. No edema and no tenderness.  Lymphadenopathy: No lymphadenopathy noted, cervical, inguinal. Neuro: Alert. Normal reflexes, muscle tone coordination. No cranial nerve deficit. Skin: Skin is  warm and dry. No rash noted. Not diaphoretic. No erythema. No pallor.  Psychiatric: Normal mood and affect. Behavior, judgment, thought content normal.   Lab Results  Component Value Date   WBC 5.8 10/20/2012   HGB 11.6* 10/20/2012   HCT 34.8* 10/20/2012   MCV 90.4 10/20/2012   PLT 362 10/20/2012   Lab Results  Component Value Date   CREATININE 0.91 10/20/2012   BUN 16 10/20/2012   NA 139 10/20/2012   K 3.9 10/20/2012   CL 100 10/20/2012   CO2 30 10/20/2012    No results found for this basename: HGBA1C   Lipid Panel     Component Value Date/Time   CHOL 195 10/20/2012 1239   TRIG 52 10/20/2012 1239   HDL 66 10/20/2012 1239   CHOLHDL 3.0 10/20/2012 1239   VLDL 10 10/20/2012 1239   LDLCALC 119* 10/20/2012 1239       Assessment and plan:   Patient Active Problem List   Diagnosis Date Noted  . GERD (gastroesophageal reflux disease) 11/21/2012    Priority: Medium - continue protonix  . Preventative health care 11/21/2012    Priority: Medium - obtain A1c today GYN referral for PAP  . Dizziness of unknown cause 09/19/2012    Priority: Medium - meclizine PRN but pt reports no dizziness at this time  . Dyslipidemia 08/18/2012    Priority: Medium - lipid panel in 10/2012 at goal  . HTN (hypertension) 08/18/2012    Priority: Medium - We have discussed target BP range - I have advised pt to check BP regularly and to call us back if the numbers are higher than 140/90 - discussed the importance of compliance with medical therapy and diet  - continue metoprolol

## 2012-11-29 ENCOUNTER — Telehealth: Payer: Self-pay

## 2012-11-29 ENCOUNTER — Telehealth: Payer: Self-pay | Admitting: *Deleted

## 2012-12-01 NOTE — Telephone Encounter (Signed)
Patient called in regards to blood sugar results. Please call patient

## 2012-12-06 ENCOUNTER — Telehealth: Payer: Self-pay | Admitting: Family Medicine

## 2012-12-06 NOTE — Telephone Encounter (Signed)
Left message with husband for pt to call tomorrow

## 2012-12-06 NOTE — Telephone Encounter (Signed)
Pt confused about medications she was prescribed, says she needs something for fluid and does not know if she received a script for this.  Says she feels dizzy from medication.  Please f/u with pt to advise on medications.

## 2012-12-19 ENCOUNTER — Telehealth: Payer: Self-pay

## 2012-12-19 NOTE — Telephone Encounter (Signed)
Patient called stated she was seen elsewhere and given fluid pill Since taking pills she feels dizzy- instructed her to stop taking the pills Elevate her feet and we will discuss her medications at her appointment  In two days

## 2012-12-19 NOTE — Telephone Encounter (Signed)
Pt would like to be ca

## 2012-12-21 ENCOUNTER — Encounter: Payer: Self-pay | Admitting: Internal Medicine

## 2012-12-21 ENCOUNTER — Ambulatory Visit: Payer: Medicaid Other | Attending: Internal Medicine | Admitting: Internal Medicine

## 2012-12-21 VITALS — BP 162/93 | HR 59 | Temp 97.8°F | Resp 16 | Ht 66.0 in | Wt 244.0 lb

## 2012-12-21 DIAGNOSIS — I10 Essential (primary) hypertension: Secondary | ICD-10-CM | POA: Insufficient documentation

## 2012-12-21 MED ORDER — HYDROCHLOROTHIAZIDE 25 MG PO TABS: 25.0000 mg | ORAL_TABLET | Freq: Every day | ORAL | Status: DC

## 2012-12-21 NOTE — Progress Notes (Signed)
Pt has no C.C.  Here today to get a medication refill.

## 2012-12-21 NOTE — Progress Notes (Signed)
Patient ID: Sarah Macdonald, female   DOB: 1965/06/08, 47 y.o.   MRN: [id number] Patient Demographics  Sarah Macdonald, is a 48 y.o. female  WUJ:[id number]  [id number]  DOB - Apr 08, 1965  Chief Complaint  Patient presents with  . Follow-up        Subjective:   Sarah Macdonald is a 47 y.o. female here today for a follow up visit. Patient was being seen by a [facility] physician before, was started recently on Lasix for bilateral lower leg edema, since then she has been feeling dizzy. She requested that Mrs. be changed to another type of water pill. She has history of hypertension and dyslipidemia, she is on metoprolol and Lasix only. She has no complaint today.  Patient has No headache, No chest pain, No abdominal pain - No Nausea, No new weakness tingling or numbness, No Cough - SOB.  ALLERGIES: Allergies  Allergen Reactions  . Cortisone Swelling    PAST MEDICAL HISTORY: Past Medical History  Diagnosis Date  . Edema leg   . Hypertension   . Hyperlipidemia     MEDICATIONS AT HOME: Prior to Admission medications   Medication Sig Start Date End Date Taking? Authorizing Provider  antiseptic oral rinse (BIOTENE) LIQD 15 mLs by Mouth Rinse route as needed. 11/21/12  Yes Alison Murray, MD  dextromethorphan 15 MG/5ML syrup Take 10 mLs (30 mg total) by mouth 3 (three) times daily as needed for cough. 11/21/12  Yes Alison Murray, MD  guaiFENesin-dextromethorphan (ROBITUSSIN DM) 100-10 MG/5ML syrup Take 10 mLs by mouth 3 (three) times daily as needed for cough. 10/26/12  Yes Jeanann Lewandowsky, MD  meclizine (ANTIVERT) 25 MG tablet Take 1 tablet (25 mg total) by mouth 3 (three) times daily as needed for dizziness. 10/02/12  Yes Jeanann Lewandowsky, MD  meloxicam (MOBIC) 15 MG tablet Take 15 mg by mouth daily.   Yes Historical Provider, MD  metoprolol tartrate (LOPRESSOR) 25 MG tablet Take 0.5 tablets (12.5 mg total) by mouth 2 (two) times daily. 11/21/12  Yes Alison Murray, MD   omeprazole (PRILOSEC OTC) 20 MG tablet Take 20 mg by mouth daily.   Yes Historical Provider, MD  hydrochlorothiazide (HYDRODIURIL) 25 MG tablet Take 1 tablet (25 mg total) by mouth daily. 12/21/12   Jeanann Lewandowsky, MD     Objective:   Filed Vitals:   12/21/12 1436  BP: 162/93  Pulse: 59  Temp: 97.8 F (36.6 C)  TempSrc: Oral  Resp: 16  Height: 5\' 6"  (1.676 m)  Weight: 244 lb (110.678 kg)  SpO2: 99%    Exam General appearance : Awake, alert, not in any distress. Speech Clear. Not toxic looking HEENT: Atraumatic and Normocephalic, pupils equally reactive to light and accomodation Neck: supple, no JVD. No cervical lymphadenopathy.  Chest:Good air entry bilaterally, no added sounds  CVS: S1 S2 regular, no murmurs.  Abdomen: Bowel sounds present, Non tender and not distended with no gaurding, rigidity or rebound. Extremities: B/L Lower Ext shows pitting edema, both legs are warm to touch Neurology: Awake alert, and oriented X 3, CN II-XII intact, Non focal Skin:No Rash Wounds:N/A   Data Review   CBC No results found for this basename: WBC, HGB, HCT, PLT, MCV, MCH, MCHC, RDW, NEUTRABS, LYMPHSABS, MONOABS, EOSABS, BASOSABS, BANDABS, BANDSABD,  in the last 168 hours  Chemistries   No results found for this basename: NA, K, CL, CO2, GLUCOSE, BUN, CREATININE, GFRCGP, CALCIUM, MG, AST, ALT, ALKPHOS, BILITOT,  in the last 168 hours ------------------------------------------------------------------------------------------------------------------ No  results found for this basename: HGBA1C,  in the last 72 hours ------------------------------------------------------------------------------------------------------------------ No results found for this basename: CHOL, HDL, LDLCALC, TRIG, CHOLHDL, LDLDIRECT,  in the last 72 hours ------------------------------------------------------------------------------------------------------------------ No results found for this basename: TSH,  T4TOTAL, FREET3, T3FREE, THYROIDAB,  in the last 72 hours ------------------------------------------------------------------------------------------------------------------ No results found for this basename: VITAMINB12, FOLATE, FERRITIN, TIBC, IRON, RETICCTPCT,  in the last 72 hours  Coagulation profile  No results found for this basename: INR, PROTIME,  in the last 168 hours    Assessment & Plan   Patient Active Problem List   Diagnosis Date Noted  . Essential hypertension, benign 12/21/2012  . GERD (gastroesophageal reflux disease) 11/21/2012  . Preventative health care 11/21/2012  . Dizziness of unknown cause 09/19/2012  . Dyslipidemia 08/18/2012  . HTN (hypertension) 08/18/2012     Plan: Hydrochlorothiazide 25 mg tablet by mouth daily Discontinue lasix Continue metoprolol as prescribed  Patient counseled extensively about smoking cessation Patient counseled about nutrition and exercise   Health Maintenance -Vaccinations:  -Influenza declined by patient  Follow up in 2 months or when necessary   The patient was given clear instructions to go to ER or return to medical center if symptoms don't improve, worsen or new problems develop. The patient verbalized understanding. The patient was told to call to get lab results if they haven't heard anything in the next week.    Jeanann Lewandowsky, MD, MHA, FACP, FAAP [facility] [place], [place] [phone]   12/21/2012, 3:00 PM

## 2012-12-21 NOTE — Patient Instructions (Addendum)
Hypertension  As your heart beats, it forces blood through your arteries. This force is your blood pressure. If the pressure is too high, it is called hypertension (HTN) or high blood pressure. HTN is dangerous because you may have it and not know it. High blood pressure may mean that your heart has to work harder to pump blood. Your arteries may be narrow or stiff. The extra work puts you at risk for heart disease, stroke, and other problems.   Blood pressure consists of two numbers, a higher number over a lower, 110/72, for example. It is stated as "110 over 72." The ideal is below 120 for the top number (systolic) and under 80 for the bottom (diastolic). Write down your blood pressure today.  You should pay close attention to your blood pressure if you have certain conditions such as:   Heart failure.   Prior heart attack.   Diabetes   Chronic kidney disease.   Prior stroke.   Multiple risk factors for heart disease.  To see if you have HTN, your blood pressure should be measured while you are seated with your arm held at the level of the heart. It should be measured at least twice. A one-time elevated blood pressure reading (especially in the Emergency Department) does not mean that you need treatment. There may be conditions in which the blood pressure is different between your right and left arms. It is important to see your caregiver soon for a recheck.  Most people have essential hypertension which means that there is not a specific cause. This type of high blood pressure may be lowered by changing lifestyle factors such as:   Stress.   Smoking.   Lack of exercise.   Excessive weight.   Drug/tobacco/alcohol use.   Eating less salt.  Most people do not have symptoms from high blood pressure until it has caused damage to the body. Effective treatment can often prevent, delay or reduce that damage.  TREATMENT    When a cause has been identified, treatment for high blood pressure is directed at the cause. There are a large number of medications to treat HTN. These fall into several categories, and your caregiver will help you select the medicines that are best for you. Medications may have side effects. You should review side effects with your caregiver.  If your blood pressure stays high after you have made lifestyle changes or started on medicines,    Your medication(s) may need to be changed.   Other problems may need to be addressed.   Be certain you understand your prescriptions, and know how and when to take your medicine.   Be sure to follow up with your caregiver within the time frame advised (usually within two weeks) to have your blood pressure rechecked and to review your medications.   If you are taking more than one medicine to lower your blood pressure, make sure you know how and at what times they should be taken. Taking two medicines at the same time can result in blood pressure that is too low.  SEEK IMMEDIATE MEDICAL CARE IF:   You develop a severe headache, blurred or changing vision, or confusion.   You have unusual weakness or numbness, or a faint feeling.   You have severe chest or abdominal pain, vomiting, or breathing problems.  MAKE SURE YOU:    Understand these instructions.   Will watch your condition.   Will get help right away if you are not doing well   or get worse.  Document Released: 01/25/2005 Document Revised: 04/19/2011 Document Reviewed: 09/15/2007  ExitCare Patient Information 2014 [employer]  DASH Diet   The DASH diet stands for "Dietary Approaches to Stop Hypertension." It is a healthy eating plan that has been shown to reduce high blood pressure (hypertension) in as little as 14 days, while also possibly providing other significant health benefits. These other health benefits include reducing the risk of breast cancer after menopause and reducing the risk of type 2 diabetes, heart disease, colon cancer, and stroke. Health benefits also include weight loss and slowing kidney failure in patients with chronic kidney disease.   DIET GUIDELINES   Limit salt (sodium). Your diet should contain less than 1500 mg of sodium daily.   Limit refined or processed carbohydrates. Your diet should include mostly whole grains. Desserts and added sugars should be used sparingly.   Include small amounts of heart-healthy fats. These types of fats include nuts, oils, and tub margarine. Limit saturated and trans fats. These fats have been shown to be harmful in the body.  CHOOSING FOODS   The following food groups are based on a 2000 calorie diet. See your Registered Dietitian for individual calorie needs.  Grains and Grain Products (6 to 8 servings daily)   Eat More Often: Whole-wheat bread, brown rice, whole-grain or wheat pasta, quinoa, popcorn without added fat or salt (air popped).   Eat Less Often: White bread, white pasta, white rice, cornbread.  Vegetables (4 to 5 servings daily)   Eat More Often: Fresh, frozen, and canned vegetables. Vegetables may be raw, steamed, roasted, or grilled with a minimal amount of fat.   Eat Less Often/Avoid: Creamed or fried vegetables. Vegetables in a cheese sauce.  Fruit (4 to 5 servings daily)   Eat More Often: All fresh, canned (in natural juice), or frozen fruits. Dried fruits without added sugar. One hundred percent fruit juice ( cup [237 mL] daily).   Eat Less Often: Dried fruits with added sugar. Canned fruit in light or heavy syrup.   Lean Meats, Fish, and Poultry (2 servings or less daily. One serving is 3 to 4 oz [85-114 g]).   Eat More Often: Ninety percent or leaner ground beef, tenderloin, sirloin. Round cuts of beef, chicken breast, turkey breast. All fish. Grill, bake, or broil your meat. Nothing should be fried.   Eat Less Often/Avoid: Fatty cuts of meat, turkey, or chicken leg, thigh, or wing. Fried cuts of meat or fish.  Dairy (2 to 3 servings)   Eat More Often: Low-fat or fat-free milk, low-fat plain or light yogurt, reduced-fat or part-skim cheese.   Eat Less Often/Avoid: Milk (whole, 2%).Whole milk yogurt. Full-fat cheeses.  Nuts, Seeds, and Legumes (4 to 5 servings per week)   Eat More Often: All without added salt.   Eat Less Often/Avoid: Salted nuts and seeds, canned beans with added salt.  Fats and Sweets (limited)   Eat More Often: Vegetable oils, tub margarines without trans fats, sugar-free gelatin. Mayonnaise and salad dressings.   Eat Less Often/Avoid: Coconut oils, palm oils, butter, stick margarine, cream, half and half, cookies, candy, pie.  FOR MORE INFORMATION  The Dash Diet Eating Plan: www.dashdiet.org  Document Released: 01/14/2011 Document Revised: 04/19/2011 Document Reviewed: 01/14/2011  [employer] Patient Information 2014 [employer].

## 2013-01-01 ENCOUNTER — Ambulatory Visit (HOSPITAL_COMMUNITY): Payer: Medicaid Other | Attending: Internal Medicine

## 2013-01-10 ENCOUNTER — Telehealth: Payer: Self-pay | Admitting: Internal Medicine

## 2013-01-10 NOTE — Telephone Encounter (Signed)
Pt asking for refill for lisinopril-hydrochlorothiazide (PRINZIDE,ZESTORETIC) 20-25 MG per tablet. Says she prefers this med because the tablets are smaller. Please f/u with pt.

## 2013-01-11 NOTE — Telephone Encounter (Signed)
Attempted to reach; line busy and additional number listed no voicemail set up

## 2013-02-13 ENCOUNTER — Telehealth: Payer: Self-pay

## 2013-02-13 NOTE — Telephone Encounter (Signed)
Pt called in today to ask about a muscle relaxer; pt was informed she would have to wait until her appt. On 1/13;

## 2013-02-20 ENCOUNTER — Encounter: Payer: Self-pay | Admitting: Internal Medicine

## 2013-02-20 ENCOUNTER — Ambulatory Visit: Payer: Medicaid Other | Attending: Internal Medicine | Admitting: Internal Medicine

## 2013-02-20 VITALS — BP 162/97 | HR 64 | Temp 98.7°F | Resp 14 | Ht 66.0 in | Wt 242.8 lb

## 2013-02-20 DIAGNOSIS — I10 Essential (primary) hypertension: Secondary | ICD-10-CM | POA: Insufficient documentation

## 2013-02-20 DIAGNOSIS — M25569 Pain in unspecified knee: Secondary | ICD-10-CM

## 2013-02-20 LAB — CBC WITH DIFFERENTIAL/PLATELET
Basophils Absolute: 0 10*3/uL (ref 0.0–0.1)
Basophils Relative: 0 % (ref 0–1)
EOS PCT: 4 % (ref 0–5)
Eosinophils Absolute: 0.3 10*3/uL (ref 0.0–0.7)
HEMATOCRIT: 35.8 % — AB (ref 36.0–46.0)
Hemoglobin: 11.8 g/dL — ABNORMAL LOW (ref 12.0–15.0)
LYMPHS ABS: 2.2 10*3/uL (ref 0.7–4.0)
LYMPHS PCT: 36 % (ref 12–46)
MCH: 29.9 pg (ref 26.0–34.0)
MCHC: 33 g/dL (ref 30.0–36.0)
MCV: 90.6 fL (ref 78.0–100.0)
MONO ABS: 0.7 10*3/uL (ref 0.1–1.0)
Monocytes Relative: 11 % (ref 3–12)
Neutro Abs: 3 10*3/uL (ref 1.7–7.7)
Neutrophils Relative %: 49 % (ref 43–77)
Platelets: 324 10*3/uL (ref 150–400)
RBC: 3.95 MIL/uL (ref 3.87–5.11)
RDW: 14.3 % (ref 11.5–15.5)
WBC: 6.3 10*3/uL (ref 4.0–10.5)

## 2013-02-20 LAB — COMPLETE METABOLIC PANEL WITH GFR
ALBUMIN: 4 g/dL (ref 3.5–5.2)
ALK PHOS: 112 U/L (ref 39–117)
ALT: 17 U/L (ref 0–35)
AST: 21 U/L (ref 0–37)
BUN: 12 mg/dL (ref 6–23)
CO2: 33 mEq/L — ABNORMAL HIGH (ref 19–32)
CREATININE: 0.93 mg/dL (ref 0.50–1.10)
Calcium: 9.5 mg/dL (ref 8.4–10.5)
Chloride: 99 mEq/L (ref 96–112)
GFR, Est African American: 84 mL/min
GFR, Est Non African American: 73 mL/min
Glucose, Bld: 106 mg/dL — ABNORMAL HIGH (ref 70–99)
POTASSIUM: 3.5 meq/L (ref 3.5–5.3)
Sodium: 139 mEq/L (ref 135–145)
Total Bilirubin: 0.3 mg/dL (ref 0.3–1.2)
Total Protein: 7.3 g/dL (ref 6.0–8.3)

## 2013-02-20 LAB — URIC ACID: Uric Acid, Serum: 7.1 mg/dL — ABNORMAL HIGH (ref 2.4–7.0)

## 2013-02-20 MED ORDER — FUROSEMIDE 40 MG PO TABS: 40.0000 mg | ORAL_TABLET | Freq: Every day | ORAL | Status: DC

## 2013-02-20 MED ORDER — LISINOPRIL 20 MG PO TABS: 20.0000 mg | ORAL_TABLET | Freq: Every day | ORAL | Status: DC

## 2013-02-20 NOTE — Progress Notes (Signed)
Pt is here for an office visit. Pt requests to take Lisinopril 20-25 mg for hypertension. Complains of pain from Rt hip to feet; selling in bilateral legs, feet and ankles x1 month. Currently taking water pills, would prefer fluid pills for swelling. No pain today. Pain comes and goes while walking and during sleep. Severe pain during sleeping and affects sleeping pattern. Taking Aleve OTC, but does not really help any pain. Requests stockings for swelling.

## 2013-02-20 NOTE — Progress Notes (Signed)
Patient ID: Sarah NashGloria A Macdonald, female   DOB: Aug 17, 1965, 48 y.o.   MRN: [id number]   CC:  HPI: [id number] year-old female with history of hypertension, dyslipidemia presents with bilateral leg swelling. Patient is hypertensive with systolic blood pressure in the 160s. She denies any shortness of breath. She has a 2-D echo pending. She is an affirmative that she would like a combination of lisinopril/HCTZ  Allergies  Allergen Reactions  . Cortisone Swelling   Past Medical History  Diagnosis Date  . Edema leg   . Hypertension   . Hyperlipidemia    Current Outpatient Prescriptions on File Prior to Visit  Medication Sig Dispense Refill  . antiseptic oral rinse (BIOTENE) LIQD 15 mLs by Mouth Rinse route as needed.  1 Bottle  3  . dextromethorphan 15 MG/5ML syrup Take 10 mLs (30 mg total) by mouth 3 (three) times daily as needed for cough.  120 mL  0  . guaiFENesin-dextromethorphan (ROBITUSSIN DM) 100-10 MG/5ML syrup Take 10 mLs by mouth 3 (three) times daily as needed for cough.  118 mL  0  . meclizine (ANTIVERT) 25 MG tablet Take 1 tablet (25 mg total) by mouth 3 (three) times daily as needed for dizziness.  30 tablet  0  . meloxicam (MOBIC) 15 MG tablet Take 15 mg by mouth daily.      . metoprolol tartrate (LOPRESSOR) 25 MG tablet Take 0.5 tablets (12.5 mg total) by mouth 2 (two) times daily.  60 tablet  3  . omeprazole (PRILOSEC OTC) 20 MG tablet Take 20 mg by mouth daily.       No current facility-administered medications on file prior to visit.   No family history on file. History   Social History  . Marital Status: Single    Spouse Name: N/A    Number of Children: N/A  . Years of Education: N/A   Occupational History  . Not on file.   Social History Main Topics  . Smoking status: Former Smoker -- 0.25 packs/day    Types: Cigarettes    Quit date: 01/25/2012  . Smokeless tobacco: Not on file  . Alcohol Use: No  . Drug Use: No  . Sexual Activity: Not Currently   Other Topics  Concern  . Not on file   Social History Narrative  . No narrative on file    Review of Systems  Constitutional: Negative for fever, chills, diaphoresis, activity change, appetite change and fatigue.  HENT: Negative for ear pain, nosebleeds, congestion, facial swelling, rhinorrhea, neck pain, neck stiffness and ear discharge.   Eyes: Negative for pain, discharge, redness, itching and visual disturbance.  Respiratory: Negative for cough, choking, chest tightness, shortness of breath, wheezing and stridor.   Cardiovascular: Negative for chest pain, palpitations and leg swelling.  Gastrointestinal: Negative for abdominal distention.  Genitourinary: Negative for dysuria, urgency, frequency, hematuria, flank pain, decreased urine volume, difficulty urinating and dyspareunia.  Musculoskeletal: Negative for back pain, joint swelling, arthralgias and gait problem.  Neurological: Negative for dizziness, tremors, seizures, syncope, facial asymmetry, speech difficulty, weakness, light-headedness, numbness and headaches.  Hematological: Negative for adenopathy. Does not bruise/bleed easily.  Psychiatric/Behavioral: Negative for hallucinations, behavioral problems, confusion, dysphoric mood, decreased concentration and agitation.    Objective:   Filed Vitals:   02/20/13 1413  BP: 162/97  Pulse: 64  Temp: 98.7 F (37.1 C)  Resp: 14    Physical Exam  Constitutional: Appears well-developed and well-nourished. No distress.  Three-month HENT: Normocephalic. External right and left ear normal.  Oropharynx is clear and moist.  Eyes: Conjunctivae and EOM are normal. PERRLA, no scleral icterus.  Neck: Normal ROM. Neck supple. No JVD. No tracheal deviation. No thyromegaly.  CVS: RRR, S1/S2 +, no murmurs, no gallops, no carotid bruit.  Pulmonary: Effort and breath sounds normal, no stridor, rhonchi, wheezes, rales.  Abdominal: Soft. BS +,  no distension, tenderness, rebound or guarding.   Musculoskeletal: Normal range of motion. No edema and no tenderness.  Lymphadenopathy: No lymphadenopathy noted, cervical, inguinal. Neuro: Alert. Normal reflexes, muscle tone coordination. No cranial nerve deficit. Skin: Skin is warm and dry. No rash noted. Not diaphoretic. No erythema. No pallor.  Psychiatric: Normal mood and affect. Behavior, judgment, thought content normal.   Lab Results  Component Value Date   WBC 5.8 10/20/2012   HGB 11.6* 10/20/2012   HCT 34.8* 10/20/2012   MCV 90.4 10/20/2012   PLT 362 10/20/2012   Lab Results  Component Value Date   CREATININE 0.91 10/20/2012   BUN 16 10/20/2012   NA 139 10/20/2012   K 3.9 10/20/2012   CL 100 10/20/2012   CO2 30 10/20/2012    Lab Results  Component Value Date   HGBA1C 6.1 11/21/2012   Lipid Panel     Component Value Date/Time   CHOL 195 10/20/2012 1239   TRIG 52 10/20/2012 1239   HDL 66 10/20/2012 1239   CHOLHDL 3.0 10/20/2012 1239   VLDL 10 10/20/2012 1239   LDLCALC 119* 10/20/2012 1239       Assessment and plan:   Patient Active Problem List   Diagnosis Date Noted  . Essential hypertension, benign 12/21/2012  . GERD (gastroesophageal reflux disease) 11/21/2012  . Preventative health care 11/21/2012  . Dizziness of unknown cause 09/19/2012  . Dyslipidemia 08/18/2012  . HTN (hypertension) 08/18/2012    Hypertension Patient is to start Lasix because of dependent edema, 2-D echo results pending Added lisinopril, continue metoprolol CMP panel today Followup in 2 months  The patient was given clear instructions to go to ER or return to medical center if symptoms don't improve, worsen or new problems develop. The patient verbalized understanding. The patient was told to call to get any lab results if not heard anything in the next week.

## 2013-02-22 ENCOUNTER — Telehealth: Payer: Self-pay | Admitting: Internal Medicine

## 2013-02-22 NOTE — Telephone Encounter (Signed)
Pt was here on Tuesday, 02/20/13 and says she received a shot.  She would like to know what type of shot she received because she is not supposed to have cortisone shots. Please f/u with pt.

## 2013-02-22 NOTE — Telephone Encounter (Signed)
Spoke with patient Complain of her legs still swollen Advised her to elevate  Feet during  Day  If it did not get better to return to office to be seen

## 2013-02-23 ENCOUNTER — Ambulatory Visit (HOSPITAL_COMMUNITY): Payer: Self-pay

## 2013-02-23 ENCOUNTER — Telehealth: Payer: Self-pay | Admitting: Internal Medicine

## 2013-02-23 ENCOUNTER — Telehealth: Payer: Self-pay | Admitting: *Deleted

## 2013-02-23 ENCOUNTER — Telehealth: Payer: Self-pay | Admitting: Emergency Medicine

## 2013-02-23 MED ORDER — ALLOPURINOL 100 MG PO TABS: 100.0000 mg | ORAL_TABLET | Freq: Every day | ORAL | Status: DC

## 2013-02-23 NOTE — Telephone Encounter (Signed)
Spoke with pt to give lab results with new script Allopurinol 100 mg daily until sx gout resolves Pt states she has no money to get medication. I will speak with Toni AmendCourtney to see if pt can get med until she has the money

## 2013-02-23 NOTE — Telephone Encounter (Signed)
Pt says leg is still hurting and would like to speak to nurse about options. Please f/u with pt.

## 2013-02-23 NOTE — Telephone Encounter (Signed)
Pt called and would like for a nurse to please give her a call back

## 2013-02-23 NOTE — Telephone Encounter (Signed)
Spoke with pt concerning leg pain Uric acid results explained and pt informed she can pick script up on Monday

## 2013-02-23 NOTE — Telephone Encounter (Signed)
Left a message with a relative for pt to give us a call back.

## 2013-02-23 NOTE — Telephone Encounter (Signed)
Message copied by Kielan Dreisbach, [place] on Fri Feb 23, 2013 12:38 PM ------      Message from: Susie CassetteABROL MD, [facility]      Created: Fri Feb 23, 2013 12:10 PM       Notify patient of the labs are normal, except hemoglobin which is mildly low. Uric acid is 7.1. The patient has been started on allopurinol 100 mg daily. Prescription called into her preferred pharmacy ------

## 2013-03-01 ENCOUNTER — Telehealth: Payer: Self-pay | Admitting: Internal Medicine

## 2013-03-01 NOTE — Telephone Encounter (Signed)
Pt is calling today to see if she can take Aleve because it relieves the pain in her feet better than script allopurinol (ZYLOPRIM) 100 MG tablet; please f/u w/ pt to advise her on wether she can continue to take Aleve instead of her medication; Pt just wants to ensure that Aleve does not conflict with other medications she is taken; [phone], please call before 5pm

## 2013-03-01 NOTE — Telephone Encounter (Signed)
Spoke with pt regarding Aleve/Allopurinol conflict. Informed pt if Aleve is working for pain,then that is ok to take with allopurinol

## 2013-03-05 ENCOUNTER — Telehealth: Payer: Self-pay | Admitting: Internal Medicine

## 2013-03-05 NOTE — Telephone Encounter (Signed)
Pt has question regarding bp medications.  She is running low and also needs refill, please f/u with pt.

## 2013-03-06 ENCOUNTER — Telehealth: Payer: Self-pay | Admitting: Emergency Medicine

## 2013-03-06 NOTE — Telephone Encounter (Signed)
Pt called in with BP home check 158/97 66. Pt states she needs bp medication refill.based on when script was written, pt should not be out of medication. She states she has been taking daily.

## 2013-03-07 NOTE — Telephone Encounter (Signed)
Pt says she forgot to ask during call yesterday whether she could be prescribed script for muscle relaxer. Please f/u with pt.

## 2013-03-07 NOTE — Telephone Encounter (Signed)
Patient would like a prescription for muscle relaxer Is this something we can give her?

## 2013-03-09 ENCOUNTER — Ambulatory Visit (HOSPITAL_COMMUNITY): Payer: Medicaid Other | Attending: Internal Medicine

## 2013-03-26 ENCOUNTER — Telehealth: Payer: Self-pay

## 2013-03-26 NOTE — Telephone Encounter (Signed)
Patient would like  To know if she can have  Her lasix and gout medication in liquid form

## 2013-03-28 ENCOUNTER — Other Ambulatory Visit: Payer: Self-pay

## 2013-03-28 ENCOUNTER — Telehealth: Payer: Self-pay

## 2013-03-28 MED ORDER — LISINOPRIL 20 MG PO TABS: 20.0000 mg | ORAL_TABLET | Freq: Every day | ORAL | Status: DC

## 2013-03-28 MED ORDER — ALLOPURINOL 100 MG PO TABS: 100.0000 mg | ORAL_TABLET | Freq: Every day | ORAL | Status: DC

## 2013-03-28 NOTE — Telephone Encounter (Signed)
Patient called again about liquid blood pressure pills Stated is out and wanted me to call her prescriptions to [facility]

## 2013-03-30 ENCOUNTER — Telehealth: Payer: Self-pay

## 2013-03-30 ENCOUNTER — Telehealth: Payer: Self-pay | Admitting: Internal Medicine

## 2013-03-30 NOTE — Telephone Encounter (Signed)
Pt called and would like a nurse to give her a call back as soon as possible

## 2013-03-30 NOTE — Telephone Encounter (Signed)
Patient called complaining her calves are still swollen She states she does not want to take any more pills i offered her an appointment to come back in and she declined i advised her that when she is sitting down to elevate her legs

## 2013-04-09 ENCOUNTER — Encounter: Payer: Medicaid Other | Admitting: Family Medicine

## 2013-04-17 ENCOUNTER — Ambulatory Visit: Payer: Medicaid Other

## 2013-04-23 ENCOUNTER — Ambulatory Visit: Payer: Medicaid Other | Admitting: Internal Medicine

## 2013-04-24 ENCOUNTER — Telehealth: Payer: Self-pay | Admitting: Internal Medicine

## 2013-04-24 NOTE — Telephone Encounter (Signed)
Pt called regarding a refill of her medication, pt states that she needs a refill of her muscle relaxer. Please contact pt

## 2013-04-26 NOTE — Telephone Encounter (Signed)
Patient states spoke with a nurse yesterday

## 2013-05-03 ENCOUNTER — Ambulatory Visit: Payer: Medicaid Other | Attending: Internal Medicine | Admitting: Family Medicine

## 2013-05-03 ENCOUNTER — Encounter: Payer: Self-pay | Admitting: Family Medicine

## 2013-05-03 VITALS — BP 155/91 | HR 60 | Temp 98.0°F | Resp 16 | Ht 66.0 in | Wt 249.0 lb

## 2013-05-03 DIAGNOSIS — R109 Unspecified abdominal pain: Secondary | ICD-10-CM | POA: Insufficient documentation

## 2013-05-03 DIAGNOSIS — K219 Gastro-esophageal reflux disease without esophagitis: Secondary | ICD-10-CM

## 2013-05-03 DIAGNOSIS — R1013 Epigastric pain: Secondary | ICD-10-CM

## 2013-05-03 DIAGNOSIS — Z9114 Patient's other noncompliance with medication regimen: Secondary | ICD-10-CM

## 2013-05-03 DIAGNOSIS — E669 Obesity, unspecified: Secondary | ICD-10-CM

## 2013-05-03 DIAGNOSIS — Z9119 Patient's noncompliance with other medical treatment and regimen: Secondary | ICD-10-CM | POA: Insufficient documentation

## 2013-05-03 DIAGNOSIS — I10 Essential (primary) hypertension: Secondary | ICD-10-CM

## 2013-05-03 DIAGNOSIS — Z91199 Patient's noncompliance with other medical treatment and regimen due to unspecified reason: Secondary | ICD-10-CM | POA: Insufficient documentation

## 2013-05-03 DIAGNOSIS — Z91148 Patient's other noncompliance with medication regimen for other reason: Secondary | ICD-10-CM

## 2013-05-03 LAB — BASIC METABOLIC PANEL
BUN: 13 mg/dL (ref 6–23)
CO2: 29 meq/L (ref 19–32)
Calcium: 9.3 mg/dL (ref 8.4–10.5)
Chloride: 105 mEq/L (ref 96–112)
Creat: 0.87 mg/dL (ref 0.50–1.10)
GLUCOSE: 116 mg/dL — AB (ref 70–99)
POTASSIUM: 4.2 meq/L (ref 3.5–5.3)
SODIUM: 139 meq/L (ref 135–145)

## 2013-05-03 MED ORDER — OMEPRAZOLE MAGNESIUM 20 MG PO TBEC: 20.0000 mg | DELAYED_RELEASE_TABLET | Freq: Every day | ORAL | Status: DC

## 2013-05-03 MED ORDER — LISINOPRIL 20 MG PO TABS: 20.0000 mg | ORAL_TABLET | Freq: Every day | ORAL | Status: DC

## 2013-05-03 NOTE — Progress Notes (Signed)
Pt is here with c/o left lower quad cramping intermit monthly pain without n/v/d. Now sx radiated to epigastric area post eating 2 large whoppers Monday. Pt need education on BP meds. States doctor took her off meds Taking Allopurinol/Lasix daily +2 edema lower extrem. Denies sob

## 2013-05-03 NOTE — Progress Notes (Signed)
Subjective:    Patient ID: Sarah Macdonald, female    DOB: [birth year], 48 y.o.   MRN: [id number]  HPI This patient is here in followup today. She is following up on hypertension, obesity, abdominal pain is a new symptom.  Hypertension she has been off her medications because she ran out and also she does not like to take pills. She prefers to be in the liquid form. She denies any headaches, chest pain, blurry vision. Her dependent edema has improved since starting the Lasix.  Obesity: She has had a problem with her weight for refills each one with oiled food instead of trying it and that is all she eats for her meals. She says that she eats at least 2 servings of produce per day. She is frustrated that she has not lost weight. years. She says that she walks daily for at least 20 minutes and raises her heart rate. She says that she got small bowls sitter 4 ounces each. She fills each one 3 times a day with either boiled food or produce and that is all she eats her her.  Abdominal pain: One week ago she ate a walker from [employer] and had epigastric pain after it. The pain has slowly been improving over the past week. Denies any nausea vomiting or diarrhea. She is listed as being on the omeprazole but says that she doesn't take it.   Noncompliance seems to be an issue for this patient. She was prescribed multiple medications but does not like taking medications so she does not take most of the pills. She does take Lasix and allopurinol.  Review of Systems A 12 point review of systems is negative except as per hpi.       Objective:   Physical Exam Nursing note and vitals reviewed. Constitutional: She is oriented to person, place, and time. She appears well-developed and well-nourished.  HENT:  Mouth/Throat: Oropharynx is clear and moist. No oropharyngeal exudate.  Eyes: Conjunctivae are normal. Pupils are equal, round, and reactive to light.  Neck: Normal range of motion. Neck supple.  No thyromegaly present.  Cardiovascular: Normal rate, regular rhythm and normal heart sounds.   Pulmonary/Chest: Effort normal and breath sounds normal.  Abdominal: Soft. Bowel sounds are normal. She exhibits no distension. There is no tenderness. There is no rebound.  Lymphadenopathy:    She has no cervical adenopathy.  Neurological: She is alert and oriented to person, place, and time. She has normal reflexes.  Skin: Skin is warm and dry.  Psychiatric: She has a normal mood and affect. Her behavior is normal.         Assessment & Plan:  Malachi BondsGloria was seen today for follow-up, hypertension and abdominal pain.  Diagnoses and associated orders for this visit:  Essential hypertension, benign - lisinopril (PRINIVIL,ZESTRIL) 20 MG tablet; Take 1 tablet (20 mg total) by mouth daily. - Basic metabolic panel Continue the Lasix. Since she has not been on lisinopril we'll start this first. She has been on metoprolol in the past. Her heart rate is 60 today. We'll need to consider heart rate when deciding whether or not to restart metoprolol. GERD (gastroesophageal reflux disease) Strongly advised restarting the Prilosec. I have refilled it. She tells me that she does not plan on taking this pill. If her abdominal pain continues she will need to be referred to GI. Abdominal pain, epigastric  Noncompliance with medication regimen Discussed at great length. HTN (hypertension)  Obesity, unspecified - TSH Discussed diet  and exercise. I am not convinced that she is exercising as much as she says that she is normally convinced that she is eating as healthily and is low-calorie and she says that she does. Have emphasized diet and exercise and if she continues not to lose weight we can consider referring her to nutrition.  Also this patient back in 3 months, earlier if needed.

## 2013-05-03 NOTE — Patient Instructions (Signed)
Calorie Counting Diet A calorie counting diet requires you to eat the number of calories that are right for you in a day. Calories are the measurement of how much energy you get from the food you eat. Eating the right amount of calories is important for staying at a healthy weight. If you eat too many calories, your body will store them as fat and you may gain weight. If you eat too few calories, you may lose weight. Counting the number of calories you eat during a day will help you know if you are eating the right amount. A Registered Dietitian can determine how many calories you need in a day. The amount of calories needed varies from person to person. If your goal is to lose weight, you will need to eat fewer calories. Losing weight can benefit you if you are overweight or have health problems such as heart disease, high blood pressure, or diabetes. If your goal is to gain weight, you will need to eat more calories. Gaining weight may be necessary if you have a certain health problem that causes your body to need more energy. TIPS Whether you are increasing or decreasing the number of calories you eat during a day, it may be hard to get used to changes in what you eat and drink. The following are tips to help you keep track of the number of calories you eat.  Measure foods at home with measuring cups. This helps you know the amount of food and number of calories you are eating.  Restaurants often serve food in amounts that are larger than 1 serving. While eating out, estimate how many servings of a food you are given. For example, a serving of cooked rice is  cup or about the size of half of a fist. Knowing serving sizes will help you be aware of how much food you are eating at restaurants.  Ask for smaller portion sizes or child-size portions at restaurants.  Plan to eat half of a meal at a restaurant. Take the rest home or share the other half with a friend.  Read the Nutrition Facts panel on  food labels for calorie content and serving size. You can find out how many servings are in a package, the size of a serving, and the number of calories each serving has.  For example, a package might contain 3 cookies. The Nutrition Facts panel on that package says that 1 serving is 1 cookie. Below that, it will say there are 3 servings in the container. The calories section of the Nutrition Facts label says there are 90 calories. This means there are 90 calories in 1 cookie (1 serving). If you eat 1 cookie you have eaten 90 calories. If you eat all 3 cookies, you have eaten 270 calories (3 servings x 90 calories = 270 calories). The list below tells you how big or small some common portion sizes are.  1 oz.........4 stacked dice.  3 oz.........Deck of cards.  1 tsp........Tip of little finger.  1 tbs........Thumb.  2 tbs........Golf ball.   cup.......Half of a fist.  1 cup........A fist. KEEP A FOOD LOG Write down every food item you eat, the amount you eat, and the number of calories in each food you eat during the day. At the end of the day, you can add up the total number of calories you have eaten. It may help to keep a list like the one below. Find out the calorie information by reading the   Nutrition Facts panel on food labels. Breakfast  Bran cereal (1 cup, 110 calories).  Fat-free milk ( cup, 45 calories). Snack  Apple (1 medium, 80 calories). Lunch  Spinach (1 cup, 20 calories).  Tomato ( medium, 20 calories).  Chicken breast strips (3 oz, 165 calories).  Shredded cheddar cheese ( cup, 110 calories).  Light Italian dressing (2 tbs, 60 calories).  Whole-wheat bread (1 slice, 80 calories).  Tub margarine (1 tsp, 35 calories).  Vegetable soup (1 cup, 160 calories). Dinner  Pork chop (3 oz, 190 calories).  Brown rice (1 cup, 215 calories).  Steamed broccoli ( cup, 20 calories).  Strawberries (1  cup, 65 calories).  Whipped cream (1 tbs, 50  calories). Daily Calorie Total: 1425 Document Released: 01/25/2005 Document Revised: 04/19/2011 Document Reviewed: 07/22/2006 [employer] Patient Information 2014 [employer].  

## 2013-05-04 LAB — TSH: TSH: 1.123 u[IU]/mL (ref 0.350–4.500)

## 2013-05-07 ENCOUNTER — Telehealth: Payer: Self-pay

## 2013-05-07 NOTE — Telephone Encounter (Signed)
Patient called wanting her lisinopril and allopurinal in  Liquid form Explained to her that those medications come in pill form and if she wanted it In liquid form  she could try [facility] where they can compound the medications She states she is not doing all that she will put her pills in apple sauce or juice

## 2013-05-10 ENCOUNTER — Telehealth: Payer: Self-pay

## 2013-05-10 NOTE — Telephone Encounter (Signed)
Message copied by Lestine MountJUAREZ, Alfredia Desanctis L on Thu May 10, 2013  3:13 PM ------      Message from: Acey LavWOOD, ALLISON L      Created: Wed May 09, 2013  8:20 PM       Labs wnl ------

## 2013-05-10 NOTE — Telephone Encounter (Signed)
Called patient  Got message not available at this time Unable to leave message

## 2013-05-16 ENCOUNTER — Encounter: Payer: Self-pay | Admitting: *Deleted

## 2013-05-16 NOTE — Progress Notes (Signed)
Pt called she reports that she is still having fluid in her legs. The Dr. Instructions were for her to stay on the lasix. She also needed to have the ECHO test done so we could further evaluate her health care.

## 2013-05-21 ENCOUNTER — Ambulatory Visit: Payer: Medicaid Other | Admitting: Internal Medicine

## 2013-05-25 ENCOUNTER — Encounter: Payer: Medicaid Other | Admitting: Medical

## 2013-06-06 ENCOUNTER — Telehealth: Payer: Self-pay | Admitting: *Deleted

## 2013-06-06 NOTE — Telephone Encounter (Signed)
Patient called wanting to know if she can take all of her medications at night? Patient states when she takes them in the morning and goes out in the sun she feels like she is going to pass out. Reather LaurenceJamie R Modesty Rudy, RN

## 2013-06-07 NOTE — Telephone Encounter (Signed)
Left a message for patient to return call. Reather LaurenceJamie R Lijah Bourque, RN

## 2013-06-07 NOTE — Telephone Encounter (Signed)
Pt. Is waiting on [facility]..regarding their conversation from 06/06/13 please call patient.Marland Kitchen..Marland Kitchen

## 2013-06-25 ENCOUNTER — Telehealth: Payer: Self-pay | Admitting: Internal Medicine

## 2013-06-25 NOTE — Telephone Encounter (Signed)
Called and spoke with patient who states she is having a hard time swallowing medications. Advised patient we need to schedule an appointment so PCP can assess her and speak with her in detail about medication options. Patient declined and sated she is going to stop taking her medications. Advised patient that I do not recommend her stopping her medications. Reather LaurenceJamie R Kieren Adkison, RN

## 2013-06-25 NOTE — Telephone Encounter (Signed)
Pt has called ni today to request a nurse call because her medications have been causing her to feel nauseous ; please f/u with pt on cell

## 2013-07-06 ENCOUNTER — Telehealth: Payer: Self-pay | Admitting: *Deleted

## 2013-07-06 NOTE — Telephone Encounter (Signed)
Patient called in stating she is having stiff knee joints in the morning and wanted to know what she can do. Patient states she is trying to loose weight so she is walking everyday, making diet changes. Informed patient she can try ibuprofen or aleve. Patient asked if she can take chondrotin/glucosamine. Informed patient that since she has high blood pressure she would have talk her PCP or pharmacist. Patient verbalized understanding. Patient also wanted to know once she looses weight if she could come off her medications. Informed patient that is for her PCP to decide. Reather Laurence, RN

## 2013-07-09 ENCOUNTER — Other Ambulatory Visit: Payer: Self-pay | Admitting: Internal Medicine

## 2013-07-09 ENCOUNTER — Telehealth: Payer: Self-pay | Admitting: Internal Medicine

## 2013-07-09 NOTE — Telephone Encounter (Signed)
Pt says she was told to schedule appt with physician for fluid medication refill. Pt scheduled appt on 07/17/13 but is still unsure as to why she needs appt.  Please f/u with pt to explain.

## 2013-07-10 ENCOUNTER — Telehealth: Payer: Self-pay | Admitting: Emergency Medicine

## 2013-07-10 NOTE — Telephone Encounter (Signed)
Pt informed she will receive refill on Lasix once seen by provider next week

## 2013-07-17 ENCOUNTER — Telehealth: Payer: Self-pay | Admitting: Emergency Medicine

## 2013-07-17 ENCOUNTER — Telehealth: Payer: Self-pay | Admitting: Internal Medicine

## 2013-07-17 DIAGNOSIS — L0291 Cutaneous abscess, unspecified: Secondary | ICD-10-CM

## 2013-07-17 NOTE — Telephone Encounter (Signed)
Pt given Dental referral for right mouth abscess

## 2013-07-17 NOTE — Telephone Encounter (Signed)
Pt calling for dental referral, says she is in a lot of pain and not sure what she can do. Please f/u with pt.

## 2013-07-19 ENCOUNTER — Ambulatory Visit: Payer: Medicaid Other | Admitting: Internal Medicine

## 2013-07-30 ENCOUNTER — Telehealth: Payer: Self-pay | Admitting: Internal Medicine

## 2013-07-30 NOTE — Telephone Encounter (Signed)
Pt has some concerns about her Gout medications and Sx; please f.u with pt @ [phone]

## 2013-07-31 ENCOUNTER — Telehealth: Payer: Self-pay | Admitting: Emergency Medicine

## 2013-07-31 MED ORDER — METOPROLOL TARTRATE 25 MG PO TABS: 12.5000 mg | ORAL_TABLET | Freq: Two times a day (BID) | ORAL | Status: DC

## 2013-07-31 NOTE — Telephone Encounter (Signed)
Pt called in requesting medication refill Allopurinol for gout flare up. Informed pt we need repeat Uric acid level before prescribing medication. Pt has scheduled appt with Vikki PortsValerie 08/03/13 Refill for BP given

## 2013-08-03 ENCOUNTER — Ambulatory Visit: Payer: Medicaid Other | Attending: Internal Medicine | Admitting: Internal Medicine

## 2013-08-03 ENCOUNTER — Encounter: Payer: Self-pay | Admitting: Internal Medicine

## 2013-08-03 VITALS — BP 178/99 | HR 71 | Temp 98.0°F | Resp 14 | Ht 66.0 in | Wt 250.0 lb

## 2013-08-03 DIAGNOSIS — K209 Esophagitis, unspecified without bleeding: Secondary | ICD-10-CM | POA: Insufficient documentation

## 2013-08-03 DIAGNOSIS — M1A00X0 Idiopathic chronic gout, unspecified site, without tophus (tophi): Secondary | ICD-10-CM

## 2013-08-03 DIAGNOSIS — M1A4790 Other secondary chronic gout, unspecified ankle and foot, without tophus (tophi): Secondary | ICD-10-CM

## 2013-08-03 DIAGNOSIS — Z91199 Patient's noncompliance with other medical treatment and regimen due to unspecified reason: Secondary | ICD-10-CM | POA: Insufficient documentation

## 2013-08-03 DIAGNOSIS — Z87891 Personal history of nicotine dependence: Secondary | ICD-10-CM | POA: Insufficient documentation

## 2013-08-03 DIAGNOSIS — I10 Essential (primary) hypertension: Secondary | ICD-10-CM

## 2013-08-03 DIAGNOSIS — M109 Gout, unspecified: Secondary | ICD-10-CM | POA: Insufficient documentation

## 2013-08-03 DIAGNOSIS — K2100 Gastro-esophageal reflux disease with esophagitis, without bleeding: Secondary | ICD-10-CM

## 2013-08-03 DIAGNOSIS — Z9114 Patient's other noncompliance with medication regimen: Secondary | ICD-10-CM | POA: Insufficient documentation

## 2013-08-03 DIAGNOSIS — M25569 Pain in unspecified knee: Secondary | ICD-10-CM | POA: Insufficient documentation

## 2013-08-03 DIAGNOSIS — K219 Gastro-esophageal reflux disease without esophagitis: Secondary | ICD-10-CM | POA: Insufficient documentation

## 2013-08-03 DIAGNOSIS — R609 Edema, unspecified: Secondary | ICD-10-CM | POA: Insufficient documentation

## 2013-08-03 DIAGNOSIS — Z91148 Patient's other noncompliance with medication regimen for other reason: Secondary | ICD-10-CM

## 2013-08-03 DIAGNOSIS — Z9119 Patient's noncompliance with other medical treatment and regimen: Secondary | ICD-10-CM

## 2013-08-03 DIAGNOSIS — E785 Hyperlipidemia, unspecified: Secondary | ICD-10-CM | POA: Insufficient documentation

## 2013-08-03 DIAGNOSIS — E669 Obesity, unspecified: Secondary | ICD-10-CM

## 2013-08-03 MED ORDER — MELOXICAM 15 MG PO TABS
15.0000 mg | ORAL_TABLET | Freq: Every day | ORAL | Status: DC
Start: 2013-08-03 — End: 2013-10-02

## 2013-08-03 MED ORDER — FUROSEMIDE 40 MG PO TABS: 40.0000 mg | ORAL_TABLET | Freq: Every day | ORAL | Status: DC

## 2013-08-03 MED ORDER — OMEPRAZOLE MAGNESIUM 20 MG PO TBEC: 20.0000 mg | DELAYED_RELEASE_TABLET | Freq: Every day | ORAL | Status: DC

## 2013-08-03 MED ORDER — LISINOPRIL 20 MG PO TABS: 20.0000 mg | ORAL_TABLET | Freq: Every day | ORAL | Status: DC

## 2013-08-03 MED ORDER — METOPROLOL TARTRATE 25 MG PO TABS: 12.5000 mg | ORAL_TABLET | Freq: Two times a day (BID) | ORAL | Status: DC

## 2013-08-03 MED ORDER — ALLOPURINOL 100 MG PO TABS: 100.0000 mg | ORAL_TABLET | Freq: Every day | ORAL | Status: DC

## 2013-08-03 NOTE — Progress Notes (Signed)
Patient ID: Sarah Macdonald, female   DOB: 07/29/1965, 48 y.o.   MRN: [id number]   Sarah Macdonald, is a 48 y.o. female  [id number]  [id number]  DOB - 07/29/1965  Chief Complaint  Patient presents with  . Follow-up        Subjective:   Sarah BullaGloria Luzier is a 48 y.o. female here today for a follow up visit. Patient is known to have hypertension, gout, dyslipidemia and leg edema. She came today saying she is out of all her medications for more than 2 weeks because she is unable to afford refill, and she is not expecting any income for another 2 weeks, she is seeking for assistance pending her income. Fortunately she has not suffered any complication due to uncontrolled hypertension because of consistent noncompliance with medications regimen, as she has no complaint today. She has been working hard on losing weight but with no appreciated will result to the point that she is almost being frustrated, she claims she's tried all sorts of diet to lose weight but to no avail. She is also concerned because her husband of 16 years wants her to be pregnant so they can have a child together, they both have children from previous marriages but none together as husband and wife. She strongly believe if she does lose more weight she will be to conceive. She denies any headache or chest pain, no abdominal pain, she still has regular menstrual period with no concern. She quit smoking over 2 years ago, she does not drink alcohol. Patient has No headache, No chest pain, No abdominal pain - No Nausea, No new weakness tingling or numbness, No Cough - SOB.  Problem  Essential Hypertension  Obesity, Unspecified  Noncompliance With Medication Regimen  Other Secondary Chronic Gout of Foot    ALLERGIES: Allergies  Allergen Reactions  . Cortisone Swelling    PAST MEDICAL HISTORY: Past Medical History  Diagnosis Date  . Edema leg   . Hypertension   . Hyperlipidemia     MEDICATIONS AT  HOME: Prior to Admission medications   Medication Sig Start Date End Date Taking? Authorizing Provider  allopurinol (ZYLOPRIM) 100 MG tablet Take 1 tablet (100 mg total) by mouth daily. 08/03/13  Yes Jeanann Lewandowskylugbemiga Jowanna Loeffler, MD  antiseptic oral rinse (BIOTENE) LIQD 15 mLs by Mouth Rinse route as needed. 11/21/12  Yes Alison MurrayAlma M Devine, MD  dextromethorphan 15 MG/5ML syrup Take 10 mLs (30 mg total) by mouth 3 (three) times daily as needed for cough. 11/21/12  Yes Alison MurrayAlma M Devine, MD  furosemide (LASIX) 40 MG tablet Take 1 tablet (40 mg total) by mouth daily. 08/03/13  Yes Jeanann Lewandowskylugbemiga Thresa Dozier, MD  lisinopril (PRINIVIL,ZESTRIL) 20 MG tablet Take 1 tablet (20 mg total) by mouth daily. 08/03/13  Yes Jeanann Lewandowskylugbemiga Chaunda Vandergriff, MD  guaiFENesin-dextromethorphan (ROBITUSSIN DM) 100-10 MG/5ML syrup Take 10 mLs by mouth 3 (three) times daily as needed for cough. 10/26/12   Jeanann Lewandowskylugbemiga Zeriah Baysinger, MD  meclizine (ANTIVERT) 25 MG tablet Take 1 tablet (25 mg total) by mouth 3 (three) times daily as needed for dizziness. 10/02/12   Jeanann Lewandowskylugbemiga Thanh Mottern, MD  meloxicam (MOBIC) 15 MG tablet Take 1 tablet (15 mg total) by mouth daily. 08/03/13   Jeanann Lewandowskylugbemiga Kallista Pae, MD  metoprolol tartrate (LOPRESSOR) 25 MG tablet Take 0.5 tablets (12.5 mg total) by mouth 2 (two) times daily. 08/03/13   Jeanann Lewandowskylugbemiga Mafalda Mcginniss, MD  omeprazole (PRILOSEC OTC) 20 MG tablet Take 1 tablet (20 mg total) by mouth daily. 08/03/13   Jeanann Lewandowskylugbemiga Shakiyah Cirilo, MD  Objective:   Filed Vitals:   08/03/13 0927  BP: 178/99  Pulse: 71  Temp: 98 F (36.7 C)  TempSrc: Oral  Resp: 14  Height: 5\' 6"  (1.676 m)  Weight: 250 lb (113.399 kg)  SpO2: 98%    Exam General appearance : Awake, alert, not in any distress. Speech Clear. Not toxic looking, obese HEENT: Atraumatic and Normocephalic, pupils equally reactive to light and accomodation Neck: supple, no JVD. No cervical lymphadenopathy.  Chest:Good air entry bilaterally, no added sounds  CVS: S1 S2 regular, no murmurs.  Abdomen: Bowel  sounds present, Non tender and not distended with no gaurding, rigidity or rebound. Extremities: B/L Lower Ext shows no edema, both legs are warm to touch Neurology: Awake alert, and oriented X 3, CN II-XII intact, Non focal Skin:No Rash Wounds:N/A  Data Review Lab Results  Component Value Date   HGBA1C 6.1 11/21/2012     Assessment & Plan   1. Essential hypertension Uncontrolled because of noncompliance  2. Obesity with bilateral knee pains  - meloxicam (MOBIC) 15 MG tablet; Take 1 tablet (15 mg total) by mouth daily.  Dispense: 30 tablet; Refill: 3  3. Noncompliance with medication regimen Patient was educated on the need for compliance with medications and consequences of uncontrolled hypertension  4. Gastroesophageal reflux disease with esophagitis  - omeprazole (PRILOSEC OTC) 20 MG tablet; Take 1 tablet (20 mg total) by mouth daily.  Dispense: 90 tablet; Refill: 3  5. Essential hypertension, benign Refill - metoprolol tartrate (LOPRESSOR) 25 MG tablet; Take 0.5 tablets (12.5 mg total) by mouth 2 (two) times daily.  Dispense: 180 tablet; Refill: 3 - lisinopril (PRINIVIL,ZESTRIL) 20 MG tablet; Take 1 tablet (20 mg total) by mouth daily.  Dispense: 90 tablet; Refill: 3 - furosemide (LASIX) 40 MG tablet; Take 1 tablet (40 mg total) by mouth daily.  Dispense: 90 tablet; Refill: 3  6. Other secondary chronic gout of foot Refill - allopurinol (ZYLOPRIM) 100 MG tablet; Take 1 tablet (100 mg total) by mouth daily.  Dispense: 90 tablet; Refill: 3  Patient was extensively counseled about nutrition and exercise Patient was counseled extensively about medication compliance We have discussed blood pressure goals and complications of uncontrolled hypertension  Return in about 3 months (around 11/03/2013), or if symptoms worsen or fail to improve, for Follow up HTN, Follow up Pain and comorbidities.  The patient was given clear instructions to go to ER or return to medical center if  symptoms don't improve, worsen or new problems develop. The patient verbalized understanding. The patient was told to call to get lab results if they haven't heard anything in the next week.   This note has been created with Education officer, [employer]Dragon speech recognition software and smart phrase technology. Any transcriptional errors are unintentional.    Jeanann LewandowskyJEGEDE, Tammala Weider, MD, MHA, FACP, FAAP [facility] [place], [place] [phone]   08/03/2013, 10:22 AM

## 2013-08-03 NOTE — Patient Instructions (Signed)
Calorie Counting for Weight Loss Calories are energy you get from the things you eat and drink. Your body uses this energy to keep you going throughout the day. The number of calories you eat affects your weight. When you eat more calories than your body needs, your body stores the extra calories as fat. When you eat fewer calories than your body needs, your body burns fat to get the energy it needs. Calorie counting means keeping track of how many calories you eat and drink each day. If you make sure to eat fewer calories than your body needs, you should lose weight. In order for calorie counting to work, you will need to eat the number of calories that are right for you in a day to lose a healthy amount of weight per week. A healthy amount of weight to lose per week is usually 1-2 lb (0.5-0.9 kg). A dietitian can determine how many calories you need in a day and give you suggestions on how to reach your calorie goal.  WHAT IS MY MY PLAN? My goal is to have __________ calories per day.  If I have this many calories per day, I should lose around __________ pounds per week. WHAT DO I NEED TO KNOW ABOUT CALORIE COUNTING? In order to meet your daily calorie goal, you will need to:  Find out how many calories are in each food you would like to eat. Try to do this before you eat.  Decide how much of the food you can eat.  Write down what you ate and how many calories it had. Doing this is called keeping a food log. WHERE DO I FIND CALORIE INFORMATION? The number of calories in a food can be found on a Nutrition Facts label. Note that all the information on a label is based on a specific serving of the food. If a food does not have a Nutrition Facts label, try to look up the calories online or ask your dietitian for help. HOW DO I DECIDE HOW MUCH TO EAT? To decide how much of the food you can eat, you will need to consider both the number of calories in one serving and the size of one serving. This  information can be found on the Nutrition Facts label. If a food does not have a Nutrition Facts label, look up the information online or ask your dietitian for help. Remember that calories are listed per serving. If you choose to have more than one serving of a food, you will have to multiply the calories per serving by the amount of servings you plan to eat. For example, the label on a package of bread might say that a serving size is 1 slice and that there are 90 calories in a serving. If you eat 1 slice, you will have eaten 90 calories. If you eat 2 slices, you will have eaten 180 calories. HOW DO I KEEP A FOOD LOG? After each meal, record the following information in your food log:  What you ate.  How much of it you ate.  How many calories it had.  Then, add up your calories. Keep your food log near you, such as in a small notebook in your pocket. Another option is to use a mobile app or website. Some programs will calculate calories for you and show you how many calories you have left each time you add an item to the log. WHAT ARE SOME CALORIE COUNTING TIPS?  Use your calories on foods   and drinks that will fill you up and not leave you hungry. Some examples of this include foods like nuts and nut butters, vegetables, lean proteins, and high-fiber foods (more than 5 g fiber per serving).  Eat nutritious foods and avoid empty calories. Empty calories are calories you get from foods or beverages that do not have many nutrients, such as candy and soda. It is better to have a nutritious high-calorie food (such as an avocado) than a food with few nutrients (such as a bag of chips).  Know how many calories are in the foods you eat most often. This way, you do not have to look up how many calories they have each time you eat them.  Look out for foods that may seem like low-calorie foods but are really high-calorie foods, such as baked goods, soda, and fat-free candy.  Pay attention to calories  in drinks. Drinks such as sodas, specialty coffee drinks, alcohol, and juices have a lot of calories yet do not fill you up. Choose low-calorie drinks like water and diet drinks.  Focus your calorie counting efforts on higher calorie items. Logging the calories in a garden salad that contains only vegetables is less important than calculating the calories in a milk shake.  Find a way of tracking calories that works for you. Get creative. Most people who are successful find ways to keep track of how much they eat in a day, even if they do not count every calorie. WHAT ARE SOME PORTION CONTROL TIPS?  Know how many calories are in a serving. This will help you know how many servings of a certain food you can have.  Use a measuring cup to measure serving sizes. This is helpful when you start out. With time, you will be able to estimate serving sizes for some foods.  Take some time to put servings of different foods on your favorite plates, bowls, and cups so you know what a serving looks like.  Try not to eat straight from a bag or box. Doing this can lead to overeating. Put the amount you would like to eat in a cup or on a plate to make sure you are eating the right portion.  Use smaller plates, glasses, and bowls to prevent overeating. This is a quick and easy way to practice portion control. If your plate is smaller, less food can fit on it.  Try not to multitask while eating, such as watching TV or using your computer. If it is time to eat, sit down at a table and enjoy your food. Doing this will help you to start recognizing when you are full. It will also make you more aware of what and how much you are eating. HOW CAN I CALORIE COUNT WHEN EATING OUT?  Ask for smaller portion sizes or child-sized portions.  Consider sharing an [name] and sides instead of getting your own [name].  If you get your own [name], eat only half. Ask for a box at the beginning of your meal and put the rest of your  entre in it so you are not tempted to eat it.  Look for the calories on the menu. If calories are listed, choose the lower calorie options.  Choose dishes that include vegetables, fruits, whole grains, low-fat dairy products, and lean protein. Focusing on smart food choices from each of the 5 food groups can help you stay on track at restaurants.  Choose items that are boiled, broiled, grilled, or steamed.  Choose  water, milk, unsweetened iced tea, or other drinks without added sugars. If you want an alcoholic beverage, choose a lower calorie option. For example, a regular margarita can have up to 700 calories and a glass of wine has around 150.  Stay away from items that are buttered, battered, fried, or served with cream sauce. Items labeled "crispy" are usually fried, unless stated otherwise.  Ask for dressings, sauces, and syrups on the side. These are usually very high in calories, so do not eat much of them.  Watch out for salads. Many people think salads are a healthy option, but this is often not the case. Many salads come with bacon, fried chicken, lots of cheese, fried chips, and dressing. All of these items have a lot of calories. If you want a salad, choose a garden salad and ask for grilled meats or steak. Ask for the dressing on the side, or ask for olive oil and vinegar or lemon to use as dressing.  Estimate how many servings of a food you are given. For example, a serving of cooked rice is 1/2 cup or about the size of half a tennis ball or one cupcake wrapper. Knowing serving sizes will help you be aware of how much food you are eating at restaurants. The list below tells you how big or small some common portion sizes are based on everyday objects.  1 oz--4 stacked dice.  3 oz--1 deck of cards.  1 tsp--1 dice.  1 Tbsp--1/2 a Ping-Pong ball.  2 Tbsp--1 Ping-Pong ball.  1/2 cup--1 tennis ball or 1 cupcake wrapper.  1 cup--1 baseball. Document Released: 01/25/2005  Document Revised: 01/30/2013 Document Reviewed: 11/30/2012 [facility] Patient Information 2015 [place], [place]. This information is not intended to replace advice given to you by your health care provider. Make sure you discuss any questions you have with your health care provider. DASH Eating Plan DASH stands for "Dietary Approaches to Stop Hypertension." The DASH eating plan is a healthy eating plan that has been shown to reduce high blood pressure (hypertension). Additional health benefits may include reducing the risk of type 2 diabetes mellitus, heart disease, and stroke. The DASH eating plan may also help with weight loss. WHAT DO I NEED TO KNOW ABOUT THE DASH EATING PLAN? For the DASH eating plan, you will follow these general guidelines:  Choose foods with a percent daily value for sodium of less than 5% (as listed on the food label).  Use salt-free seasonings or herbs instead of table salt or sea salt.  Check with your health care provider or pharmacist before using salt substitutes.  Eat lower-sodium products, often labeled as "lower sodium" or "no salt added."  Eat fresh foods.  Eat more vegetables, fruits, and low-fat dairy products.  Choose whole grains. Look for the word "whole" as the first word in the ingredient list.  Choose fish and skinless chicken or [place] more often than red meat. Limit fish, poultry, and meat to 6 oz (170 g) each day.  Limit sweets, desserts, sugars, and sugary drinks.  Choose heart-healthy fats.  Limit cheese to 1 oz (28 g) per day.  Eat more home-cooked food and less restaurant, buffet, and fast food.  Limit fried foods.  Cook foods using methods other than frying.  Limit canned vegetables. If you do use them, rinse them well to decrease the sodium.  When eating at a restaurant, ask that your food be prepared with less salt, or no salt if possible. WHAT FOODS CAN I  EAT? Seek help from a dietitian for individual calorie  needs. Grains Whole grain or whole wheat bread. Brown rice. Whole grain or whole wheat pasta. Quinoa, bulgur, and whole grain cereals. Low-sodium cereals. Corn or whole wheat flour tortillas. Whole grain cornbread. Whole grain crackers. Low-sodium crackers. Vegetables Fresh or frozen vegetables (raw, steamed, roasted, or grilled). Low-sodium or reduced-sodium tomato and vegetable juices. Low-sodium or reduced-sodium tomato sauce and paste. Low-sodium or reduced-sodium canned vegetables.  Fruits All fresh, canned (in natural juice), or frozen fruits. Meat and Other Protein Products Ground beef (85% or leaner), grass-fed beef, or beef trimmed of fat. Skinless chicken or [place]. Ground chicken or [place]. Pork trimmed of fat. All fish and seafood. Eggs. Dried beans, peas, or lentils. Unsalted nuts and seeds. Unsalted canned beans. Dairy Low-fat dairy products, such as skim or 1% milk, 2% or reduced-fat cheeses, low-fat ricotta or cottage cheese, or plain low-fat yogurt. Low-sodium or reduced-sodium cheeses. Fats and Oils Tub margarines without trans fats. Light or reduced-fat mayonnaise and salad dressings (reduced sodium). Avocado. Safflower, olive, or canola oils. Natural peanut or almond butter. Other Unsalted popcorn and pretzels. The items listed above may not be a complete list of recommended foods or beverages. Contact your dietitian for more options. WHAT FOODS ARE NOT RECOMMENDED? Grains White bread. White pasta. White rice. Refined cornbread. Bagels and croissants. Crackers that contain trans fat. Vegetables Creamed or fried vegetables. Vegetables in a cheese sauce. Regular canned vegetables. Regular canned tomato sauce and paste. Regular tomato and vegetable juices. Fruits Dried fruits. Canned fruit in light or heavy syrup. Fruit juice. Meat and Other Protein Products Fatty cuts of meat. Ribs, chicken wings, bacon, sausage, bologna, salami, chitterlings, fatback, hot dogs, bratwurst,  and packaged luncheon meats. Salted nuts and seeds. Canned beans with salt. Dairy Whole or 2% milk, cream, half-and-half, and cream cheese. Whole-fat or sweetened yogurt. Full-fat cheeses or blue cheese. Nondairy creamers and whipped toppings. Processed cheese, cheese spreads, or cheese curds. Condiments Onion and garlic salt, seasoned salt, table salt, and sea salt. Canned and packaged gravies. Worcestershire sauce. Tartar sauce. Barbecue sauce. Teriyaki sauce. Soy sauce, including reduced sodium. Steak sauce. Fish sauce. Oyster sauce. Cocktail sauce. Horseradish. Ketchup and mustard. Meat flavorings and tenderizers. Bouillon cubes. Hot sauce. Tabasco sauce. Marinades. Taco seasonings. Relishes. Fats and Oils Butter, stick margarine, lard, shortening, ghee, and bacon fat. Coconut, palm kernel, or palm oils. Regular salad dressings. Other Pickles and olives. Salted popcorn and pretzels. The items listed above may not be a complete list of foods and beverages to avoid. Contact your dietitian for more information. WHERE CAN I FIND MORE INFORMATION? National Heart, Lung, and Blood Institute: [employer]Prom[employer].itwww.nhlbi.nih.gov/health/health-topics/topics/dash/ Document Released: 01/14/2011 Document Revised: 01/30/2013 Document Reviewed: 11/29/2012 [facility] CenterExitCare Patient Information 2015 [place], [place]. This information is not intended to replace advice given to you by your health care provider. Make sure you discuss any questions you have with your health care provider. Hypertension Hypertension, commonly called high blood pressure, is when the force of blood pumping through your arteries is too strong. Your arteries are the blood vessels that carry blood from your heart throughout your body. A blood pressure reading consists of a higher number over a lower number, such as 110/72. The higher number (systolic) is the pressure inside your arteries when your heart pumps. The lower number (diastolic) is the pressure inside your  arteries when your heart relaxes. Ideally you want your blood pressure below 120/80. Hypertension forces your heart to work harder to pump blood. Your  arteries may become narrow or stiff. Having hypertension puts you at risk for heart disease, stroke, and other problems.  RISK FACTORS Some risk factors for high blood pressure are controllable. Others are not.  Risk factors you cannot control include:   Race. You may be at higher risk if you are African American.  Age. Risk increases with age.  Gender. Men are at higher risk than women before age 21 years. After age 66, women are at higher risk than men. Risk factors you can control include:  Not getting enough exercise or physical activity.  Being overweight.  Getting too much fat, sugar, calories, or salt in your diet.  Drinking too much alcohol. SIGNS AND SYMPTOMS Hypertension does not usually cause signs or symptoms. Extremely high blood pressure (hypertensive crisis) may cause headache, anxiety, shortness of breath, and nosebleed. DIAGNOSIS  To check if you have hypertension, your health care provider will measure your blood pressure while you are seated, with your arm held at the level of your heart. It should be measured at least twice using the same arm. Certain conditions can cause a difference in blood pressure between your right and left arms. A blood pressure reading that is higher than normal on one occasion does not mean that you need treatment. If one blood pressure reading is high, ask your health care provider about having it checked again. TREATMENT  Treating high blood pressure includes making lifestyle changes and possibly taking medication. Living a healthy lifestyle can help lower high blood pressure. You may need to change some of your habits. Lifestyle changes may include:  Following the DASH diet. This diet is high in fruits, vegetables, and whole grains. It is low in salt, red meat, and added sugars.  Getting at  least 2 1/2 hours of brisk physical activity every week.  Losing weight if necessary.  Not smoking.  Limiting alcoholic beverages.  Learning ways to reduce stress. If lifestyle changes are not enough to get your blood pressure under control, your health care provider may prescribe medicine. You may need to take more than one. Work closely with your health care provider to understand the risks and benefits. HOME CARE INSTRUCTIONS  Have your blood pressure rechecked as directed by your health care provider.   Only take medicine as directed by your health care provider. Follow the directions carefully. Blood pressure medicines must be taken as prescribed. The medicine does not work as well when you skip doses. Skipping doses also puts you at risk for problems.   Do not smoke.   Monitor your blood pressure at home as directed by your health care provider. SEEK MEDICAL CARE IF:   You think you are having a reaction to medicines taken.  You have recurrent headaches or feel dizzy.  You have swelling in your ankles.  You have trouble with your vision. SEEK IMMEDIATE MEDICAL CARE IF:  You develop a severe headache or confusion.  You have unusual weakness, numbness, or feel faint.  You have severe chest or abdominal pain.  You vomit repeatedly.  You have trouble breathing. MAKE SURE YOU:   Understand these instructions.  Will watch your condition.  Will get help right away if you are not doing well or get worse. Document Released: 01/25/2005 Document Revised: 01/30/2013 Document Reviewed: 11/17/2012 [facility] Patient Information 2015 [place], [place]. This information is not intended to replace advice given to you by your health care provider. Make sure you discuss any questions you have with your health care  provider.  

## 2013-08-03 NOTE — Progress Notes (Signed)
Pt is here following up on her HTN. Pt needs a refill for her medications. Pt states that last night her right foot starting to hurt. Pt has been out of her meds for 2 weeks.

## 2013-08-07 ENCOUNTER — Telehealth: Payer: Self-pay | Admitting: Internal Medicine

## 2013-08-07 NOTE — Telephone Encounter (Signed)
Pt left voicemail saying she would like a call back from nurse regarding medications. Please f/u with pt.

## 2013-08-08 ENCOUNTER — Telehealth: Payer: Self-pay | Admitting: Emergency Medicine

## 2013-08-08 NOTE — Telephone Encounter (Signed)
Left message for pt to call when message received 

## 2013-08-15 ENCOUNTER — Telehealth: Payer: Self-pay | Admitting: *Deleted

## 2013-08-15 NOTE — Telephone Encounter (Signed)
Aleve will be fine if her BP is controlled. If not she may need to come in for a rx of colchicine. Make sure she is not taking ibuprofen and aleve together.

## 2013-08-15 NOTE — Telephone Encounter (Signed)
Patient called stating she needs to see a doctor because she bumped her right arm. Patient states she can not come in until next week Wednesday. Patient denies chest pain, numbness in arms or inability to move arm. Patient states the arm is a little puffy. Patient instructed to apply ice to the right arm and take some ibuprofen. Patient would like to know if she could take Aleve for the gout pain? Is this ok?

## 2013-08-21 ENCOUNTER — Telehealth: Payer: Self-pay | Admitting: Emergency Medicine

## 2013-08-21 ENCOUNTER — Telehealth: Payer: Self-pay | Admitting: Internal Medicine

## 2013-08-21 NOTE — Telephone Encounter (Signed)
Pt says she is experiencing severe leg pain and is having trouble sleeping. Pt asking whether muscle relaxer script would help her symptoms.  Please f/u with pt, pt's last appt on 08/06/13.

## 2013-08-21 NOTE — Telephone Encounter (Signed)
Returning pt phone call concerning constant muscle cramps in both leg causing diff sleeping. Pt prescribed Allopurinol for gout but states not working for pain. Please f/u with next step

## 2013-08-27 ENCOUNTER — Telehealth: Payer: Self-pay | Admitting: Internal Medicine

## 2013-08-27 NOTE — Telephone Encounter (Signed)
Pt states she was prescribed allopurinol for gout Pt states that medication is not helping her symptoms. Pt also req meds for muscle spasms. pls contact pt

## 2013-08-30 ENCOUNTER — Telehealth: Payer: Self-pay | Admitting: Emergency Medicine

## 2013-08-30 NOTE — Telephone Encounter (Signed)
Pt calls in requesting meds for leg spasms and medication refills. Pt scheduled nurse visit 09/01/23 @ 1030 am

## 2013-08-31 NOTE — Telephone Encounter (Signed)
Pt returning nurse's call. Please f/u with pt.  °

## 2013-09-14 ENCOUNTER — Telehealth: Payer: Self-pay | Admitting: Internal Medicine

## 2013-09-14 NOTE — Telephone Encounter (Signed)
Pt calling to speak to nurse. Please f/u with pt.  °

## 2013-09-18 ENCOUNTER — Telehealth: Payer: Self-pay | Admitting: Internal Medicine

## 2013-09-18 NOTE — Telephone Encounter (Signed)
Pt needs medication "muscle relaxer".She got prescription three weeks ago but Pharmacy didn't have it. Please f/u with Pt/

## 2013-09-21 ENCOUNTER — Encounter: Payer: Self-pay | Admitting: Internal Medicine

## 2013-09-21 ENCOUNTER — Ambulatory Visit: Payer: Self-pay | Attending: Internal Medicine | Admitting: Internal Medicine

## 2013-09-21 VITALS — BP 111/72 | HR 69 | Temp 97.7°F | Resp 20 | Ht 66.0 in | Wt 250.0 lb

## 2013-09-21 DIAGNOSIS — I10 Essential (primary) hypertension: Secondary | ICD-10-CM | POA: Insufficient documentation

## 2013-09-21 DIAGNOSIS — M79605 Pain in left leg: Secondary | ICD-10-CM

## 2013-09-21 DIAGNOSIS — Z888 Allergy status to other drugs, medicaments and biological substances status: Secondary | ICD-10-CM | POA: Insufficient documentation

## 2013-09-21 DIAGNOSIS — M79604 Pain in right leg: Secondary | ICD-10-CM

## 2013-09-21 DIAGNOSIS — M109 Gout, unspecified: Secondary | ICD-10-CM | POA: Insufficient documentation

## 2013-09-21 DIAGNOSIS — E785 Hyperlipidemia, unspecified: Secondary | ICD-10-CM | POA: Insufficient documentation

## 2013-09-21 DIAGNOSIS — M79609 Pain in unspecified limb: Secondary | ICD-10-CM | POA: Insufficient documentation

## 2013-09-21 DIAGNOSIS — R609 Edema, unspecified: Secondary | ICD-10-CM | POA: Insufficient documentation

## 2013-09-21 DIAGNOSIS — Z87891 Personal history of nicotine dependence: Secondary | ICD-10-CM | POA: Insufficient documentation

## 2013-09-21 MED ORDER — CYCLOBENZAPRINE HCL 10 MG PO TABS: 10.0000 mg | ORAL_TABLET | Freq: Every day | ORAL | Status: DC

## 2013-09-21 NOTE — Telephone Encounter (Signed)
I called pt today she has an appointment today where she will have her questions answered.

## 2013-09-21 NOTE — Progress Notes (Signed)
Medication refills Pt stated has daily pain in both legs

## 2013-09-21 NOTE — Progress Notes (Signed)
Patient ID: Sarah Macdonald, female   DOB: 1966-02-05, 48 y.o.   MRN: [id number]  CC: med refill  HPI:  Patient presents to clinic today for medication refills. Patient reports that she has been taking her medication but she does not have money for refills.  She has plenty of refills on her medications but does not have money.  She believes she is having a flare of gout at the present moment.  She is having pain in bilateral feet that is constant and she recently began to have pain in her right hand joints.  She reports that she has cramps in her left pelvic region but has not had a cycle in one year  Allergies  Allergen Reactions  . Cortisone Swelling   Past Medical History  Diagnosis Date  . Edema leg   . Hypertension   . Hyperlipidemia    Current Outpatient Prescriptions on File Prior to Visit  Medication Sig Dispense Refill  . allopurinol (ZYLOPRIM) 100 MG tablet Take 1 tablet (100 mg total) by mouth daily.  90 tablet  3  . antiseptic oral rinse (BIOTENE) LIQD 15 mLs by Mouth Rinse route as needed.  1 Bottle  3  . dextromethorphan 15 MG/5ML syrup Take 10 mLs (30 mg total) by mouth 3 (three) times daily as needed for cough.  120 mL  0  . furosemide (LASIX) 40 MG tablet Take 1 tablet (40 mg total) by mouth daily.  90 tablet  3  . guaiFENesin-dextromethorphan (ROBITUSSIN DM) 100-10 MG/5ML syrup Take 10 mLs by mouth 3 (three) times daily as needed for cough.  118 mL  0  . meloxicam (MOBIC) 15 MG tablet Take 1 tablet (15 mg total) by mouth daily.  30 tablet  3  . metoprolol tartrate (LOPRESSOR) 25 MG tablet Take 0.5 tablets (12.5 mg total) by mouth 2 (two) times daily.  180 tablet  3  . omeprazole (PRILOSEC OTC) 20 MG tablet Take 1 tablet (20 mg total) by mouth daily.  90 tablet  3  . lisinopril (PRINIVIL,ZESTRIL) 20 MG tablet Take 1 tablet (20 mg total) by mouth daily.  90 tablet  3  . meclizine (ANTIVERT) 25 MG tablet Take 1 tablet (25 mg total) by mouth 3 (three) times daily as needed  for dizziness.  30 tablet  0   No current facility-administered medications on file prior to visit.   Family History  Problem Relation Age of Onset  . Hyperlipidemia Sister    History   Social History  . Marital Status: Single    Spouse Name: N/A    Number of Children: N/A  . Years of Education: N/A   Occupational History  . Not on file.   Social History Main Topics  . Smoking status: Former Smoker -- 0.25 packs/day    Types: Cigarettes    Quit date: 01/25/2012  . Smokeless tobacco: Not on file  . Alcohol Use: No  . Drug Use: No  . Sexual Activity: Not Currently   Other Topics Concern  . Not on file   Social History Narrative  . No narrative on file    Review of Systems: Constitutional: Negative for fever, chills, diaphoresis, activity change, appetite change and fatigue. HENT: Negative for ear pain, nosebleeds, congestion, facial swelling, rhinorrhea, neck pain, neck stiffness and ear discharge.  Eyes: Negative for pain, discharge, redness, itching and visual disturbance. Respiratory: Negative for cough, choking, chest tightness, shortness of breath, wheezing and stridor.  Cardiovascular: Negative for chest pain, palpitations  and leg swelling. Gastrointestinal: Negative for abdominal distention. Genitourinary: Negative for dysuria, urgency, frequency, hematuria, flank pain, decreased urine volume, difficulty urinating and dyspareunia.  Musculoskeletal: Negative for back pain, joint swelling, arthralgias and gait problem. Neurological: Negative for dizziness, tremors, seizures, syncope, facial asymmetry, speech difficulty, weakness, light-headedness, numbness and headaches.  Hematological: Negative for adenopathy. Does not bruise/bleed easily. Psychiatric/Behavioral: Negative for hallucinations, behavioral problems, confusion, dysphoric mood, decreased concentration and agitation.    Objective:   Filed Vitals:   09/21/13 1024  BP: 111/72  Pulse: 69  Temp: 97.7  F (36.5 C)  Resp: 20    Physical Exam: Constitutional: Patient appears well-developed and well-nourished. No distress. HENT: Normocephalic, atraumatic, External right and left ear normal. Oropharynx is clear and moist.  Eyes: Conjunctivae and EOM are normal. PERRLA, no scleral icterus. Neck: Normal ROM. Neck supple. No JVD. No tracheal deviation. No thyromegaly. CVS: RRR, S1/S2 +, no murmurs, no gallops, no carotid bruit.  Pulmonary: Effort and breath sounds normal, no stridor, rhonchi, wheezes, rales.  Abdominal: Soft. BS +,  no distension, tenderness, rebound or guarding.  Musculoskeletal: Normal range of motion. No edema and no tenderness.  Lymphadenopathy: No lymphadenopathy noted, cervical. Psychiatric: Normal mood and affect. Behavior, judgment, thought content normal.  Lab Results  Component Value Date   WBC 6.3 02/20/2013   HGB 11.8* 02/20/2013   HCT 35.8* 02/20/2013   MCV 90.6 02/20/2013   PLT 324 02/20/2013   Lab Results  Component Value Date   CREATININE 0.87 05/03/2013   BUN 13 05/03/2013   NA 139 05/03/2013   K 4.2 05/03/2013   CL 105 05/03/2013   CO2 29 05/03/2013    Lab Results  Component Value Date   HGBA1C 6.1 11/21/2012   Lipid Panel     Component Value Date/Time   CHOL 195 10/20/2012 1239   TRIG 52 10/20/2012 1239   HDL 66 10/20/2012 1239   CHOLHDL 3.0 10/20/2012 1239   VLDL 10 10/20/2012 1239   LDLCALC 119* 10/20/2012 1239       Assessment and plan:   Sarah Macdonald was seen today for medication refill.  Diagnoses and associated orders for this visit:  Essential hypertension, benign Continue current medications Leg pain, bilateral - cyclobenzaprine (FLEXERIL) 10 MG tablet; Take 1 tablet (10 mg total) by mouth at bedtime.   Return in about 3 months (around 12/22/2013) for pap and HTN.        Holland CommonsKECK, VALERIE, NP-C [facility] [phone] 09/21/2013, 10:55 AM

## 2013-10-01 ENCOUNTER — Telehealth: Payer: Self-pay | Admitting: Internal Medicine

## 2013-10-01 NOTE — Telephone Encounter (Signed)
Patient feels dizzy, short of breath, chills. However, she cannot come today for a Walk In Please f/u with Pt

## 2013-10-01 NOTE — Telephone Encounter (Signed)
Pt. Called again about feeling dizzy. An appt was made for Friday with PCP.

## 2013-10-02 ENCOUNTER — Emergency Department (HOSPITAL_COMMUNITY)
Admission: EM | Admit: 2013-10-02 | Discharge: 2013-10-02 | Disposition: A | Discharge status: Home / Self Care | Payer: Medicaid Other | Attending: Emergency Medicine | Admitting: Emergency Medicine

## 2013-10-02 ENCOUNTER — Encounter (HOSPITAL_COMMUNITY): Payer: Self-pay | Admitting: Emergency Medicine

## 2013-10-02 DIAGNOSIS — Z862 Personal history of diseases of the blood and blood-forming organs and certain disorders involving the immune mechanism: Secondary | ICD-10-CM | POA: Insufficient documentation

## 2013-10-02 DIAGNOSIS — Z8639 Personal history of other endocrine, nutritional and metabolic disease: Secondary | ICD-10-CM | POA: Insufficient documentation

## 2013-10-02 DIAGNOSIS — Z87891 Personal history of nicotine dependence: Secondary | ICD-10-CM | POA: Insufficient documentation

## 2013-10-02 DIAGNOSIS — Z79899 Other long term (current) drug therapy: Secondary | ICD-10-CM | POA: Insufficient documentation

## 2013-10-02 DIAGNOSIS — R531 Weakness: Secondary | ICD-10-CM

## 2013-10-02 DIAGNOSIS — R5383 Other fatigue: Principal | ICD-10-CM

## 2013-10-02 DIAGNOSIS — I10 Essential (primary) hypertension: Secondary | ICD-10-CM | POA: Insufficient documentation

## 2013-10-02 DIAGNOSIS — R5381 Other malaise: Secondary | ICD-10-CM | POA: Insufficient documentation

## 2013-10-02 LAB — URINALYSIS, ROUTINE W REFLEX MICROSCOPIC
Bilirubin Urine: NEGATIVE
GLUCOSE, UA: NEGATIVE mg/dL
Hgb urine dipstick: NEGATIVE
Ketones, ur: NEGATIVE mg/dL
Nitrite: NEGATIVE
Protein, ur: NEGATIVE mg/dL
SPECIFIC GRAVITY, URINE: 1.007 (ref 1.005–1.030)
Urobilinogen, UA: 1 mg/dL (ref 0.0–1.0)
pH: 6.5 (ref 5.0–8.0)

## 2013-10-02 LAB — CBC WITH DIFFERENTIAL/PLATELET
BASOS ABS: 0 10*3/uL (ref 0.0–0.1)
BASOS PCT: 0 % (ref 0–1)
EOS ABS: 0.1 10*3/uL (ref 0.0–0.7)
EOS PCT: 1 % (ref 0–5)
HEMATOCRIT: 34.5 % — AB (ref 36.0–46.0)
Hemoglobin: 11.4 g/dL — ABNORMAL LOW (ref 12.0–15.0)
LYMPHS PCT: 30 % (ref 12–46)
Lymphs Abs: 1.9 10*3/uL (ref 0.7–4.0)
MCH: 29.9 pg (ref 26.0–34.0)
MCHC: 33 g/dL (ref 30.0–36.0)
MCV: 90.6 fL (ref 78.0–100.0)
MONO ABS: 0.5 10*3/uL (ref 0.1–1.0)
Monocytes Relative: 8 % (ref 3–12)
Neutro Abs: 3.8 10*3/uL (ref 1.7–7.7)
Neutrophils Relative %: 61 % (ref 43–77)
Platelets: 238 10*3/uL (ref 150–400)
RBC: 3.81 MIL/uL — ABNORMAL LOW (ref 3.87–5.11)
RDW: 14.2 % (ref 11.5–15.5)
WBC: 6.2 10*3/uL (ref 4.0–10.5)

## 2013-10-02 LAB — COMPREHENSIVE METABOLIC PANEL
ALBUMIN: 3.2 g/dL — AB (ref 3.5–5.2)
ALK PHOS: 105 U/L (ref 39–117)
ALT: 11 U/L (ref 0–35)
AST: 13 U/L (ref 0–37)
Anion gap: 12 (ref 5–15)
BUN: 10 mg/dL (ref 6–23)
CALCIUM: 9.2 mg/dL (ref 8.4–10.5)
CO2: 26 mEq/L (ref 19–32)
Chloride: 101 mEq/L (ref 96–112)
Creatinine, Ser: 1.1 mg/dL (ref 0.50–1.10)
GFR calc Af Amer: 68 mL/min — ABNORMAL LOW (ref >90)
GFR calc non Af Amer: 58 mL/min — ABNORMAL LOW (ref >90)
Glucose, Bld: 132 mg/dL — ABNORMAL HIGH (ref 70–99)
POTASSIUM: 3.9 meq/L (ref 3.7–5.3)
SODIUM: 139 meq/L (ref 137–147)
TOTAL PROTEIN: 7.5 g/dL (ref 6.0–8.3)
Total Bilirubin: 0.4 mg/dL (ref 0.3–1.2)

## 2013-10-02 LAB — TROPONIN I: Troponin I: <0.3 ng/mL (ref <0.30)

## 2013-10-02 LAB — URINE MICROSCOPIC-ADD ON

## 2013-10-02 MED ORDER — SODIUM CHLORIDE 0.9 % IV BOLUS (SEPSIS): 1000.0000 mL | Freq: Once | INTRAVENOUS | Status: DC

## 2013-10-02 NOTE — ED Notes (Signed)
Patient states feeling of tiredness and weakness x 3 days, patient states fevers and chills, denies cough, denies n/v/d, no c/o of pain

## 2013-10-02 NOTE — ED Provider Notes (Signed)
CSN: [id number]     Arrival date & time 10/02/13  7829 History   First MD Initiated Contact with Patient 10/02/13 [phone]     Chief Complaint  Patient presents with  . Fatigue     (Consider location/radiation/quality/duration/timing/severity/associated sxs/prior Treatment) HPI Comments: Patient is a 48 year old female with history of hypertension and high cholesterol. She presents today with complaints of progressive weakness for the past 3 days. She denies any fevers or chills. She denies any discomfort. She reports walking to work several days ago and is concerned that the heat may have caused her to become dehydrated. She's had difficulty eating or drinking since that time, however reports normal urine output  Patient is a 48 y.o. female presenting with weakness. The history is provided by the patient.  Weakness This is a new problem. Episode onset: 3 days ago. The problem occurs constantly. The problem has been gradually worsening. Pertinent negatives include no chest pain, no abdominal pain and no shortness of breath. Nothing aggravates the symptoms. Nothing relieves the symptoms. She has tried nothing for the symptoms. The treatment provided no relief.    Past Medical History  Diagnosis Date  . Edema leg   . Hypertension   . Hyperlipidemia    History reviewed. No pertinent past surgical history. Family History  Problem Relation Age of Onset  . Hyperlipidemia Sister    History  Substance Use Topics  . Smoking status: Former Smoker -- 0.25 packs/day    Types: Cigarettes    Quit date: 01/25/2012  . Smokeless tobacco: Not on file  . Alcohol Use: No   OB History   Grav Para Term Preterm Abortions TAB SAB Ect Mult Living                 Review of Systems  Respiratory: Negative for shortness of breath.   Cardiovascular: Negative for chest pain.  Gastrointestinal: Negative for abdominal pain.  Neurological: Positive for weakness.  All other systems reviewed and are  negative.     Allergies  Cortisone  Home Medications   Prior to Admission medications   Medication Sig Start Date End Date Taking? Authorizing Provider  allopurinol (ZYLOPRIM) 100 MG tablet Take 1 tablet (100 mg total) by mouth daily. 08/03/13  Yes Quentin Angst, MD  cyclobenzaprine (FLEXERIL) 10 MG tablet Take 1 tablet (10 mg total) by mouth at bedtime. 09/21/13  Yes Ambrose Finland, NP  furosemide (LASIX) 40 MG tablet Take 1 tablet (40 mg total) by mouth daily. 08/03/13  Yes Quentin Angst, MD  lisinopril (PRINIVIL,ZESTRIL) 20 MG tablet Take 1 tablet (20 mg total) by mouth daily. 08/03/13  Yes Quentin Angst, MD  meclizine (ANTIVERT) 25 MG tablet Take 1 tablet (25 mg total) by mouth 3 (three) times daily as needed for dizziness. 10/02/12  Yes Quentin Angst, MD  metoprolol tartrate (LOPRESSOR) 25 MG tablet Take 12.5 mg by mouth daily.   Yes Historical Provider, MD   BP 108/68  Pulse 92  Temp(Src) 98.4 F (36.9 C) (Oral)  Resp 20  Ht  (1.676 m)  Wt 210 lb (95.255 kg)  BMI 33.91 kg/m2  SpO2 95% Physical Exam  Nursing note and vitals reviewed. Constitutional: She is oriented to person, place, and time. She appears well-developed and well-nourished. No distress.  HENT:  Head: Normocephalic and atraumatic.  Mouth/Throat: Oropharynx is clear and moist.  Neck: Normal range of motion. Neck supple.  Cardiovascular: Normal rate, regular rhythm and normal heart sounds.  No murmur heard. Pulmonary/Chest: Effort normal and breath sounds normal. No respiratory distress. She has no wheezes.  Abdominal: Soft. Bowel sounds are normal. She exhibits no distension. There is no tenderness.  Musculoskeletal: Normal range of motion. She exhibits no edema.  Neurological: She is alert and oriented to person, place, and time.  Skin: Skin is warm and dry. She is not diaphoretic.    ED Course  Procedures (including critical care time) Labs Review Labs Reviewed  CBC WITH  DIFFERENTIAL  COMPREHENSIVE METABOLIC PANEL  TROPONIN I  URINALYSIS, ROUTINE W REFLEX MICROSCOPIC    Imaging Review No results found.   EKG Interpretation   Date/Time:  Tuesday October 02 2013 09:48:09 EDT Ventricular Rate:  93 PR Interval:  132 QRS Duration: 84 QT Interval:  402 QTC Calculation: 499 R Axis:   62 Text Interpretation:  Normal sinus rhythm Anterior infarct , age  undetermined T wave abnormality, consider inferolateral ischemia Abnormal  ECG Confirmed by DELOS  MD, Joesiah Lonon ([id number]) on 10/02/2013 10:09:34 AM      MDM   Final diagnoses:  None    Patient presents with weakness and fatigue for the past 3 days. Her physical examination is unremarkable and laboratory studies reveal no significant abnormalities. Her EKG shows nonspecific T wave abnormalities, however her troponin is negative and she is having no symptoms that would be suggestive of a cardiac etiology. At this point I feel as though she is appropriate for discharge. She is to followup with her primary Dr. in the next week and return to the ER if she experiences any further difficulties.   Geoffery Lyons, MD 10/02/13 [phone]

## 2013-10-02 NOTE — ED Notes (Signed)
Patient in NAD at time of d/c 

## 2013-10-02 NOTE — Discharge Instructions (Signed)
Return to the emergency department if you experience chest pain, difficulty breathing, or any other new and concerning symptoms.   Fatigue Fatigue is a feeling of tiredness, lack of energy, lack of motivation, or feeling tired all the time. Having enough rest, good nutrition, and reducing stress will normally reduce fatigue. Consult your caregiver if it persists. The nature of your fatigue will help your caregiver to find out its cause. The treatment is based on the cause.  CAUSES  There are many causes for fatigue. Most of the time, fatigue can be traced to one or more of your habits or routines. Most causes fit into one or more of three general areas. They are: Lifestyle problems  Sleep disturbances.  Overwork.  Physical exertion.  Unhealthy habits.  Poor eating habits or eating disorders.  Alcohol and/or drug use .  Lack of proper nutrition (malnutrition). Psychological problems  Stress and/or anxiety problems.  Depression.  Grief.  Boredom. Medical Problems or Conditions  Anemia.  Pregnancy.  Thyroid gland problems.  Recovery from major surgery.  Continuous pain.  Emphysema or asthma that is not well controlled  Allergic conditions.  Diabetes.  Infections (such as mononucleosis).  Obesity.  Sleep disorders, such as sleep apnea.  Heart failure or other heart-related problems.  Cancer.  Kidney disease.  Liver disease.  Effects of certain medicines such as antihistamines, cough and cold remedies, prescription pain medicines, heart and blood pressure medicines, drugs used for treatment of cancer, and some antidepressants. SYMPTOMS  The symptoms of fatigue include:   Lack of energy.  Lack of drive (motivation).  Drowsiness.  Feeling of indifference to the surroundings. DIAGNOSIS  The details of how you feel help guide your caregiver in finding out what is causing the fatigue. You will be asked about your present and past health condition. It  is important to review all medicines that you take, including prescription and non-prescription items. A thorough exam will be done. You will be questioned about your feelings, habits, and normal lifestyle. Your caregiver may suggest blood tests, urine tests, or other tests to look for common medical causes of fatigue.  TREATMENT  Fatigue is treated by correcting the underlying cause. For example, if you have continuous pain or depression, treating these causes will improve how you feel. Similarly, adjusting the dose of certain medicines will help in reducing fatigue.  HOME CARE INSTRUCTIONS   Try to get the required amount of good sleep every night.  Eat a healthy and nutritious diet, and drink enough water throughout the day.  Practice ways of relaxing (including yoga or meditation).  Exercise regularly.  Make plans to change situations that cause stress. Act on those plans so that stresses decrease over time. Keep your work and personal routine reasonable.  Avoid street drugs and minimize use of alcohol.  Start taking a daily multivitamin after consulting your caregiver. SEEK MEDICAL CARE IF:   You have persistent tiredness, which cannot be accounted for.  You have fever.  You have unintentional weight loss.  You have headaches.  You have disturbed sleep throughout the night.  You are feeling sad.  You have constipation.  You have dry skin.  You have gained weight.  You are taking any new or different medicines that you suspect are causing fatigue.  You are unable to sleep at night.  You develop any unusual swelling of your legs or other parts of your body. SEEK IMMEDIATE MEDICAL CARE IF:   You are feeling confused.  Your vision  is blurred.  You feel faint or pass out.  You develop severe headache.  You develop severe abdominal, pelvic, or back pain.  You develop chest pain, shortness of breath, or an irregular or fast heartbeat.  You are unable to pass a  normal amount of urine.  You develop abnormal bleeding such as bleeding from the rectum or you vomit blood.  You have thoughts about harming yourself or committing suicide.  You are worried that you might harm someone else. MAKE SURE YOU:   Understand these instructions.  Will watch your condition.  Will get help right away if you are not doing well or get worse. Document Released: 11/22/2006 Document Revised: 04/19/2011 Document Reviewed: 05/29/2013 [facility] Patient Information 2015 [place], [place]. This information is not intended to replace advice given to you by your health care provider. Make sure you discuss any questions you have with your health care provider.

## 2013-10-04 ENCOUNTER — Telehealth: Payer: Self-pay | Admitting: Internal Medicine

## 2013-10-04 NOTE — Telephone Encounter (Signed)
Pt calling to speak to nurse. Please f/u with pt.  °

## 2013-10-05 ENCOUNTER — Telehealth: Payer: Self-pay | Admitting: Emergency Medicine

## 2013-10-05 ENCOUNTER — Ambulatory Visit: Payer: Medicaid Other | Admitting: Internal Medicine

## 2013-10-05 NOTE — Telephone Encounter (Signed)
Left message for pt to call.

## 2013-10-22 ENCOUNTER — Telehealth: Payer: Self-pay | Admitting: Internal Medicine

## 2013-10-22 NOTE — Telephone Encounter (Signed)
Patient has come in today to request an appointment with her PCP about her leg pain; patient was informed that we do not have any walk-ins available; please f/u with patient by phone about her pain

## 2013-10-23 ENCOUNTER — Telehealth: Payer: Self-pay | Admitting: Emergency Medicine

## 2013-10-23 NOTE — Telephone Encounter (Signed)
Left message for pt to call when message recieved

## 2013-10-25 ENCOUNTER — Telehealth: Payer: Self-pay | Admitting: *Deleted

## 2013-10-25 ENCOUNTER — Telehealth: Payer: Self-pay | Admitting: Internal Medicine

## 2013-10-25 NOTE — Telephone Encounter (Signed)
Pt. Called because she would like an increase on her medications, pt. States that her legs are still swollen and in pain. Please f/u with pt.

## 2013-10-25 NOTE — Telephone Encounter (Signed)
Left message to return my call.  

## 2013-11-06 ENCOUNTER — Ambulatory Visit: Payer: Medicaid Other | Admitting: Internal Medicine

## 2013-11-08 ENCOUNTER — Other Ambulatory Visit: Payer: Self-pay | Admitting: Emergency Medicine

## 2013-11-08 ENCOUNTER — Telehealth: Payer: Self-pay | Admitting: Internal Medicine

## 2013-11-08 DIAGNOSIS — I10 Essential (primary) hypertension: Secondary | ICD-10-CM

## 2013-11-08 MED ORDER — LISINOPRIL 20 MG PO TABS: 20.0000 mg | ORAL_TABLET | Freq: Every day | ORAL | Status: DC

## 2013-11-08 MED ORDER — FUROSEMIDE 40 MG PO TABS: 40.0000 mg | ORAL_TABLET | Freq: Every day | ORAL | Status: DC

## 2013-11-08 MED ORDER — METOPROLOL TARTRATE 25 MG PO TABS: 12.5000 mg | ORAL_TABLET | Freq: Every day | ORAL | Status: DC

## 2013-11-08 NOTE — Telephone Encounter (Signed)
Patient needs refills for: lisinopril 20 mg, furosemide 40 mg, allopurinol 100 mg, metoprolol tartrate 25 mg ASAP. Please f/u with this Patient

## 2013-11-12 ENCOUNTER — Ambulatory Visit: Payer: Medicaid Other | Admitting: Internal Medicine

## 2013-11-12 ENCOUNTER — Telehealth: Payer: Self-pay | Admitting: Internal Medicine

## 2013-11-12 NOTE — Telephone Encounter (Signed)
Patient calling to request medication refill. Please refer to previous message. Thank you.

## 2013-11-13 ENCOUNTER — Telehealth: Payer: Self-pay | Admitting: Emergency Medicine

## 2013-11-13 NOTE — Telephone Encounter (Signed)
Left message that pt medication request e-scribed to [facility].

## 2013-12-25 ENCOUNTER — Telehealth: Payer: Self-pay | Admitting: Internal Medicine

## 2013-12-25 NOTE — Telephone Encounter (Signed)
Expand All Collapse All   Pt has gouch in feet and toe. She is taking fluid pills but says that she is interested in the socks for pressure but she does not have any money. Please follow up with patient if you know of a program that can provide her with these specials socks at a discounted or free rate.       Can we assist this pt? Please f/u

## 2013-12-25 NOTE — Telephone Encounter (Signed)
Pt has gouch in feet and toe. She is taking fluid pills but says that she is interested in the socks for pressure but she does not have any money. Please follow up with patient if you know of a program that can provide her with these specials socks at a discounted or free rate.

## 2014-01-15 ENCOUNTER — Ambulatory Visit: Payer: Medicaid Other

## 2014-01-18 ENCOUNTER — Ambulatory Visit: Payer: Medicaid Other | Admitting: Internal Medicine

## 2014-01-25 ENCOUNTER — Other Ambulatory Visit (HOSPITAL_COMMUNITY)
Admission: RE | Admit: 2014-01-25 | Discharge: 2014-01-25 | Disposition: A | Discharge status: Home / Self Care | Payer: Medicaid Other | Source: Ambulatory Visit | Attending: Internal Medicine | Admitting: Internal Medicine

## 2014-01-25 ENCOUNTER — Ambulatory Visit: Payer: Self-pay | Attending: Internal Medicine | Admitting: Internal Medicine

## 2014-01-25 ENCOUNTER — Encounter: Payer: Self-pay | Admitting: Internal Medicine

## 2014-01-25 VITALS — BP 138/93 | HR 70 | Temp 98.1°F | Resp 18 | Ht 66.0 in | Wt 248.0 lb

## 2014-01-25 DIAGNOSIS — L304 Erythema intertrigo: Secondary | ICD-10-CM

## 2014-01-25 DIAGNOSIS — Z01419 Encounter for gynecological examination (general) (routine) without abnormal findings: Secondary | ICD-10-CM | POA: Insufficient documentation

## 2014-01-25 DIAGNOSIS — Z01411 Encounter for gynecological examination (general) (routine) with abnormal findings: Secondary | ICD-10-CM | POA: Insufficient documentation

## 2014-01-25 DIAGNOSIS — I10 Essential (primary) hypertension: Secondary | ICD-10-CM | POA: Insufficient documentation

## 2014-01-25 DIAGNOSIS — N760 Acute vaginitis: Secondary | ICD-10-CM | POA: Insufficient documentation

## 2014-01-25 DIAGNOSIS — Z87891 Personal history of nicotine dependence: Secondary | ICD-10-CM | POA: Insufficient documentation

## 2014-01-25 DIAGNOSIS — Z113 Encounter for screening for infections with a predominantly sexual mode of transmission: Secondary | ICD-10-CM | POA: Insufficient documentation

## 2014-01-25 DIAGNOSIS — E785 Hyperlipidemia, unspecified: Secondary | ICD-10-CM | POA: Insufficient documentation

## 2014-01-25 DIAGNOSIS — Z1151 Encounter for screening for human papillomavirus (HPV): Secondary | ICD-10-CM | POA: Insufficient documentation

## 2014-01-25 DIAGNOSIS — Z124 Encounter for screening for malignant neoplasm of cervix: Secondary | ICD-10-CM

## 2014-01-25 MED ORDER — LISINOPRIL 20 MG PO TABS: 20.0000 mg | ORAL_TABLET | Freq: Every day | ORAL | Status: DC

## 2014-01-25 MED ORDER — METOPROLOL TARTRATE 25 MG PO TABS: 12.5000 mg | ORAL_TABLET | Freq: Every day | ORAL | Status: DC

## 2014-01-25 MED ORDER — NYSTATIN 100000 UNIT/GM EX CREA: 1.0000 "application " | TOPICAL_CREAM | Freq: Two times a day (BID) | CUTANEOUS | Status: DC

## 2014-01-25 MED ORDER — FUROSEMIDE 40 MG PO TABS: 40.0000 mg | ORAL_TABLET | Freq: Every day | ORAL | Status: DC

## 2014-01-25 NOTE — Progress Notes (Signed)
Patient ID: Sarah Macdonald, female   DOB: Dec 01, 1965, 48 y.o.   MRN: [id number]  CC: pap/HTN  HPI: Sarah Macdonald is a [age] y.o. female here today for a physical exam.  Patient has past medical history of hypertension and hyperlipidemia.  Patient reports that she has been off her blood pressure medications for 3 months because she has not had money to pay for the medicine.  She has a blood pressure meter at home and reports that her pressures have usually been around 130's/90's.  Denies headaches, chest pain, blurred vision, palpitations, or SOB.  She does admit to edema of BLE.    Today she is requesting a pap smear.  Last pap smear has been several years ago and it was normal.  She reports a white vaginal discharge with some odor for past one year.  She denies sexual intercourse for the past five years.  No itch or lesion.    Patient has No headache, No chest pain, No abdominal pain - No Nausea, No new weakness tingling or numbness, No Cough - SOB.  Allergies  Allergen Reactions  . Cortisone Swelling   Past Medical History  Diagnosis Date  . Edema leg   . Hypertension   . Hyperlipidemia    Current Outpatient Prescriptions on File Prior to Visit  Medication Sig Dispense Refill  . furosemide (LASIX) 40 MG tablet Take 1 tablet (40 mg total) by mouth daily. 90 tablet 3  . lisinopril (PRINIVIL,ZESTRIL) 20 MG tablet Take 1 tablet (20 mg total) by mouth daily. 90 tablet 3  . metoprolol tartrate (LOPRESSOR) 25 MG tablet Take 0.5 tablets (12.5 mg total) by mouth daily. 30 tablet 2  . allopurinol (ZYLOPRIM) 100 MG tablet Take 1 tablet (100 mg total) by mouth daily. (Patient not taking: Reported on 01/25/2014) 90 tablet 3  . cyclobenzaprine (FLEXERIL) 10 MG tablet Take 1 tablet (10 mg total) by mouth at bedtime. (Patient not taking: Reported on 01/25/2014) 30 tablet 1  . meclizine (ANTIVERT) 25 MG tablet Take 1 tablet (25 mg total) by mouth 3 (three) times daily as needed for dizziness.  (Patient not taking: Reported on 01/25/2014) 30 tablet 0   No current facility-administered medications on file prior to visit.   Family History  Problem Relation Age of Onset  . Hyperlipidemia Sister    History   Social History  . Marital Status: Single    Spouse Name: N/A    Number of Children: N/A  . Years of Education: N/A   Occupational History  . Not on file.   Social History Main Topics  . Smoking status: Former Smoker -- 0.25 packs/day    Types: Cigarettes    Quit date: 01/25/2012  . Smokeless tobacco: Not on file  . Alcohol Use: No  . Drug Use: No  . Sexual Activity: Not Currently   Other Topics Concern  . Not on file   Social History Narrative    Review of Systems  All other systems reviewed and are negative.     Objective:   Filed Vitals:   01/25/14 1443  BP: 138/93  Pulse: 70  Temp: 98.1 F (36.7 C)  Resp: 18    Physical Exam  Cardiovascular: Normal rate, regular rhythm and normal heart sounds.   Pulmonary/Chest: Effort normal and breath sounds normal. Right breast exhibits inverted nipple (normal for patient). Right breast exhibits no mass and no tenderness. Left breast exhibits inverted nipple (normal for pt). Left breast exhibits no mass and no tenderness.  Abdominal: Soft. Bowel sounds are normal.  Genitourinary: Uterus normal. There is rash on the right labia. There is rash on the left labia. Cervix exhibits no motion tenderness and no discharge. Right adnexum displays no tenderness. Left adnexum displays no tenderness. No tenderness in the vagina. Vaginal discharge found.  Lymphadenopathy:       Right: No inguinal adenopathy present.       Left: No inguinal adenopathy present.     Lab Results  Component Value Date   WBC 6.2 10/02/2013   HGB 11.4* 10/02/2013   HCT 34.5* 10/02/2013   MCV 90.6 10/02/2013   PLT 238 10/02/2013   Lab Results  Component Value Date   CREATININE 1.10 10/02/2013   BUN 10 10/02/2013   NA 139 10/02/2013    K 3.9 10/02/2013   CL 101 10/02/2013   CO2 26 10/02/2013    Lab Results  Component Value Date   HGBA1C 6.1 11/21/2012   Lipid Panel     Component Value Date/Time   CHOL 195 10/20/2012 1239   TRIG 52 10/20/2012 1239   HDL 66 10/20/2012 1239   CHOLHDL 3.0 10/20/2012 1239   VLDL 10 10/20/2012 1239   LDLCALC 119* 10/20/2012 1239       Assessment and plan:   Sarah Macdonald was seen today for annual exam.  Diagnoses and associated orders for this visit:  Papanicolaou smear - Cytology - PAP [place] - Cervicovaginal ancillary only  Essential hypertension, benign - Refill furosemide (LASIX) 40 MG tablet; Take 1 tablet (40 mg total) by mouth daily. - Refill lisinopril (PRINIVIL,ZESTRIL) 20 MG tablet; Take 1 tablet (20 mg total) by mouth daily. - Refill metoprolol tartrate (LOPRESSOR) 25 MG tablet; Take 0.5 tablets (12.5 mg total) by mouth daily.  Intertrigo - nystatin cream (MYCOSTATIN); Apply 1 application topically 2 (two) times daily. If this does not clear may try ketoconazole cream.   Return in about 3 months (around 04/26/2014) for Hypertension.       Holland CommonsKECK, VALERIE, NP-C [facility] [phone] 01/25/2014, 3:16 PM

## 2014-01-25 NOTE — Progress Notes (Signed)
Annual physical and pap Pt stated not taking medication x 2 month

## 2014-01-25 NOTE — Patient Instructions (Signed)
Intertrigo Intertrigo is a skin condition that occurs in between folds of skin in places on the body that rub together a lot and do not get much ventilation. It is caused by heat, moisture, friction, sweat retention, and lack of air circulation, which produces red, irritated patches and, sometimes, scaling or drainage. People who have diabetes, who are obese, or who have treatment with antibiotics are at increased risk for intertrigo. The most common sites for intertrigo to occur include:  The groin.  The breasts.  The armpits.  Folds of abdominal skin.  Webbed spaces between the fingers or toes. Intertrigo may be aggravated by:  Sweat.  Feces.  Yeast or bacteria that are present near skin folds.  Urine.  Vaginal discharge. HOME CARE INSTRUCTIONS  The following steps can be taken to reduce friction and keep the affected area cool and dry:  Expose skin folds to the air.  Keep deep skin folds separated with cotton or linen cloth. Avoid tight fitting clothing that could cause chafing.  Wear open-toed shoes or sandals to help reduce moisture between the toes.  Apply absorbent powders to affected areas as directed by your caregiver.  Apply over-the-counter barrier pastes, such as zinc oxide, as directed by your caregiver.  If you develop a fungal infection in the affected area, your caregiver may have you use antifungal creams. SEEK MEDICAL CARE IF:   The rash is not improving after 1 week of treatment.  The rash is getting worse (more red, more swollen, more painful, or spreading).  You have a fever or chills. MAKE SURE YOU:   Understand these instructions.  Will watch your condition.  Will get help right away if you are not doing well or get worse. Document Released: 01/25/2005 Document Revised: 04/19/2011 Document Reviewed: 07/10/2009 ExitCare Patient Information 2015 [employer]. This information is not intended to replace advice given to you by your health  care provider. Make sure you discuss any questions you have with your health care provider.  

## 2014-01-29 LAB — CYTOLOGY - PAP

## 2014-01-29 LAB — CERVICOVAGINAL ANCILLARY ONLY
CHLAMYDIA, DNA PROBE: NEGATIVE
NEISSERIA GONORRHEA: NEGATIVE
WET PREP (BD AFFIRM): NEGATIVE
WET PREP (BD AFFIRM): NEGATIVE
Wet Prep (BD Affirm): NEGATIVE

## 2014-02-04 ENCOUNTER — Telehealth: Payer: Self-pay | Admitting: Internal Medicine

## 2014-02-04 ENCOUNTER — Other Ambulatory Visit: Payer: Self-pay | Admitting: Emergency Medicine

## 2014-02-04 DIAGNOSIS — K0381 Cracked tooth: Secondary | ICD-10-CM

## 2014-02-04 NOTE — Telephone Encounter (Signed)
Pt says she forgot to mention during last office visit that she needs a dental referral due to a tooth that is hurting and is loose.  Please f/u with pt regarding what she can do in the meantime.

## 2014-02-06 ENCOUNTER — Other Ambulatory Visit: Payer: Medicaid Other

## 2014-02-11 NOTE — Telephone Encounter (Signed)
-----   Message from Ambrose Finland, NP sent at 02/03/2014  8:06 PM EST ----- Patient pap is negative for malignancies and infections. Will repeat in 3 years.

## 2014-02-11 NOTE — Telephone Encounter (Signed)
Pt aware of lab results Dentist information mailed to pt

## 2014-02-12 ENCOUNTER — Other Ambulatory Visit: Payer: Medicaid Other

## 2014-02-18 ENCOUNTER — Other Ambulatory Visit: Payer: Medicaid Other

## 2014-03-01 ENCOUNTER — Other Ambulatory Visit: Payer: Medicaid Other

## 2014-03-04 ENCOUNTER — Other Ambulatory Visit: Payer: Self-pay | Admitting: Internal Medicine

## 2014-03-04 NOTE — Telephone Encounter (Signed)
Pt requesting refill on allopurinol (ZYLOPRIM) 100 MG tablet and has question about what to take in the meantime. Please f/u with pt.

## 2014-03-04 NOTE — Telephone Encounter (Signed)
Pt notified has Refills, advised to call pharmacy

## 2014-03-15 ENCOUNTER — Ambulatory Visit: Payer: Medicaid Other | Admitting: Internal Medicine

## 2014-03-18 ENCOUNTER — Telehealth: Payer: Self-pay | Admitting: Internal Medicine

## 2014-03-18 NOTE — Telephone Encounter (Signed)
Pt is having a lot of tooth pain and she was referred to the dentist but for some reason she is being charged $30, which she cant afford. I have transferred her to [facility] but also thought that you can contact patient with advise for her pain. She says she can not chew.

## 2014-03-27 ENCOUNTER — Ambulatory Visit: Payer: Medicaid Other | Admitting: Internal Medicine

## 2014-04-15 ENCOUNTER — Telehealth: Payer: Self-pay | Admitting: Internal Medicine

## 2014-04-15 NOTE — Telephone Encounter (Signed)
I spoke with the Sarah Macdonald and she wanted to know if hot dogs was ok for her to eat with her high blood pressure. I advised that hot dogs, deli meat, etc are high in sodium and not good for her to eat. I advised her to avoid these foods. Sarah Macdonald states understanding. Carron CurieJennifer Castillo, CMA

## 2014-04-15 NOTE — Telephone Encounter (Signed)
Pt called requesting to speak to nurse. Please f/u with pt  °

## 2014-04-16 ENCOUNTER — Encounter (HOSPITAL_COMMUNITY): Payer: Self-pay | Admitting: *Deleted

## 2014-04-16 ENCOUNTER — Emergency Department (INDEPENDENT_AMBULATORY_CARE_PROVIDER_SITE_OTHER)
Admission: EM | Admit: 2014-04-16 | Discharge: 2014-04-16 | Disposition: A | Discharge status: Home / Self Care | Payer: No Typology Code available for payment source | Source: Home / Self Care | Attending: Family Medicine | Admitting: Family Medicine

## 2014-04-16 DIAGNOSIS — K088 Other specified disorders of teeth and supporting structures: Secondary | ICD-10-CM

## 2014-04-16 DIAGNOSIS — K0889 Other specified disorders of teeth and supporting structures: Secondary | ICD-10-CM

## 2014-04-16 MED ORDER — AMOXICILLIN 500 MG PO CAPS: 500.0000 mg | ORAL_CAPSULE | Freq: Three times a day (TID) | ORAL | Status: DC

## 2014-04-16 MED ORDER — DICLOFENAC POTASSIUM 50 MG PO TABS: 50.0000 mg | ORAL_TABLET | Freq: Three times a day (TID) | ORAL | Status: DC

## 2014-04-16 NOTE — ED Notes (Addendum)
Pt  Reports  Pain l    Side  Jaw    -  Pt  Has  Multiple         Dental  Problems  She           Reports      Her  Tooth  Came out  Recently  As   Well        She         Reports        She  Is  Out  Of  Her  bp     meds

## 2014-04-16 NOTE — ED Notes (Signed)
Pt  Advised  To  Get  Her  meds  Refilled

## 2014-04-16 NOTE — Discharge Instructions (Signed)
Take medicine as prescribed, see your dentist as soon as possible °

## 2014-04-16 NOTE — ED Provider Notes (Signed)
CSN: [id number]     Arrival date & time 04/16/14  1116 History   First MD Initiated Contact with Patient 04/16/14 1126     No chief complaint on file.  (Consider location/radiation/quality/duration/timing/severity/associated sxs/prior Treatment) Patient is a 49 y.o. female presenting with tooth pain. The history is provided by the patient.  Dental Pain Location:  Upper Upper teeth location:  11/LU cuspid Quality:  Shooting Severity:  Moderate Onset quality:  Sudden Duration:  3 days Progression:  Unchanged Chronicity:  New Context: intrusion, malocclusion and poor dentition   Context comment:  Tooth came out on sat, cheek pain since, other tteeth are loose, malaligned. Associated symptoms: facial pain and gum swelling   Associated symptoms: no facial swelling and no fever   Risk factors: lack of dental care and periodontal disease     Past Medical History  Diagnosis Date  . Edema leg   . Hypertension   . Hyperlipidemia    History reviewed. No pertinent past surgical history. Family History  Problem Relation Age of Onset  . Hyperlipidemia Sister    History  Substance Use Topics  . Smoking status: Former Smoker -- 0.25 packs/day    Types: Cigarettes    Quit date: 01/25/2012  . Smokeless tobacco: Not on file  . Alcohol Use: No   OB History    No data available     Review of Systems  Constitutional: Negative for fever.  HENT: Positive for dental problem. Negative for facial swelling.     Allergies  Cortisone  Home Medications   Prior to Admission medications   Medication Sig Start Date End Date Taking? Authorizing Provider  allopurinol (ZYLOPRIM) 100 MG tablet Take 1 tablet (100 mg total) by mouth daily. 08/03/13   Quentin Angstlugbemiga E Jegede, MD  amoxicillin (AMOXIL) 500 MG capsule Take 1 capsule (500 mg total) by mouth 3 (three) times daily. 04/16/14   Linna HoffJames D Gabreille Dardis, MD  cyclobenzaprine (FLEXERIL) 10 MG tablet Take 1 tablet (10 mg total) by mouth at bedtime. 09/21/13    Ambrose FinlandValerie A Keck, NP  diclofenac (CATAFLAM) 50 MG tablet Take 1 tablet (50 mg total) by mouth 3 (three) times daily. 04/16/14   Linna HoffJames D Jasaun Carn, MD  furosemide (LASIX) 40 MG tablet Take 1 tablet (40 mg total) by mouth daily. 01/25/14   Ambrose FinlandValerie A Keck, NP  lisinopril (PRINIVIL,ZESTRIL) 20 MG tablet Take 1 tablet (20 mg total) by mouth daily. 01/25/14   Ambrose FinlandValerie A Keck, NP  meclizine (ANTIVERT) 25 MG tablet Take 1 tablet (25 mg total) by mouth 3 (three) times daily as needed for dizziness. 10/02/12   Quentin Angstlugbemiga E Jegede, MD  metoprolol tartrate (LOPRESSOR) 25 MG tablet Take 0.5 tablets (12.5 mg total) by mouth daily. 01/25/14   Ambrose FinlandValerie A Keck, NP  nystatin cream (MYCOSTATIN) Apply 1 application topically 2 (two) times daily. 01/25/14   Ambrose FinlandValerie A Keck, NP   BP 155/102 mmHg  Pulse 61  Temp(Src) 97.6 F (36.4 C) (Oral)  Resp 16  SpO2 95% Physical Exam  Constitutional: She is oriented to person, place, and time. She appears well-developed and well-nourished. No distress.  HENT:  Mouth/Throat:    Neck: Normal range of motion. Neck supple.  Neurological: She is alert and oriented to person, place, and time.  Skin: Skin is warm and dry.  Nursing note and vitals reviewed.   ED Course  Procedures (including critical care time) Labs Review Labs Reviewed - No data to display  Imaging Review No results found.   MDM  1. Pain, dental        Linna Hoff, MD 04/16/14 1200

## 2015-01-29 NOTE — Telephone Encounter (Signed)
error 

## 2016-05-29 ENCOUNTER — Ambulatory Visit (HOSPITAL_COMMUNITY)
Admission: EM | Admit: 2016-05-29 | Discharge: 2016-05-29 | Disposition: A | Discharge status: Home / Self Care | Payer: Self-pay | Attending: Family Medicine | Admitting: Family Medicine

## 2016-05-29 ENCOUNTER — Encounter (HOSPITAL_COMMUNITY): Payer: Self-pay | Admitting: Emergency Medicine

## 2016-05-29 DIAGNOSIS — K219 Gastro-esophageal reflux disease without esophagitis: Secondary | ICD-10-CM

## 2016-05-29 MED ORDER — ACETAMINOPHEN 325 MG PO TABS
650.0000 mg | ORAL_TABLET | Freq: Once | ORAL | Status: AC
  Administered 2016-05-29: 650 mg via ORAL

## 2016-05-29 MED ORDER — GI COCKTAIL ~~LOC~~
ORAL | Status: AC
  Filled 2016-05-29: qty 30

## 2016-05-29 MED ORDER — RANITIDINE HCL 150 MG PO TABS: 150.0000 mg | ORAL_TABLET | Freq: Two times a day (BID) | ORAL | 0 refills | Status: DC

## 2016-05-29 MED ORDER — GI COCKTAIL ~~LOC~~
30.0000 mL | Freq: Once | ORAL | Status: AC
  Administered 2016-05-29: 30 mL via ORAL

## 2016-05-29 MED ORDER — ACETAMINOPHEN 325 MG PO TABS
ORAL_TABLET | ORAL | Status: AC
  Filled 2016-05-29: qty 2

## 2016-05-29 NOTE — ED Provider Notes (Signed)
[facility]    CSN: [id number] Arrival date & time: 05/29/16  1622     History   Chief Complaint Chief Complaint  Patient presents with  . Abdominal Pain  . Back Pain  . Headache    HPI Sarah Macdonald is a 51 y.o. female.   Pt complains of epigastric pain that radiates to her back and a headache since last night.  Pt denies N, V, or D.  Patient denies cough, chest pain, nausea, prior abdominal pain, or urinary symptoms.      Past Medical History:  Diagnosis Date  . Edema leg   . Hyperlipidemia   . Hypertension     Patient Active Problem List   Diagnosis Date Noted  . Essential hypertension 08/03/2013  . Obesity, unspecified 08/03/2013  . Noncompliance with medication regimen 08/03/2013  . 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 Essential hypertension, benign 12/21/2012  . GERD (gastroesophageal reflux disease) 11/21/2012  . Preventative health care 11/21/2012  . Dizziness of unknown cause 09/19/2012  . Dyslipidemia 08/18/2012  . HTN (hypertension) 08/18/2012    History reviewed. No pertinent surgical history.  OB History    No data available       Home Medications    Prior to Admission medications   Medication Sig Start Date End Date Taking? Authorizing Provider  allopurinol (ZYLOPRIM) 100 MG tablet Take 1 tablet (100 mg total) by mouth daily. 08/03/13  Yes Quentin Angst, MD  diclofenac (CATAFLAM) 50 MG tablet Take 1 tablet (50 mg total) by mouth 3 (three) times daily. 04/16/14  Yes Linna Hoff, MD  furosemide (LASIX) 40 MG tablet Take 1 tablet (40 mg total) by mouth daily. 01/25/14  Yes Ambrose Finland, NP  cyclobenzaprine (FLEXERIL) 10 MG tablet Take 1 tablet (10 mg total) by mouth at bedtime. 09/21/13   Ambrose Finland, NP  meclizine (ANTIVERT) 25 MG tablet Take 1 tablet (25 mg total) by mouth 3 (three) times daily as needed for dizziness. 10/02/12   Quentin Angst, MD  metoprolol tartrate (LOPRESSOR) 25 MG tablet  Take 0.5 tablets (12.5 mg total) by mouth daily. 01/25/14   Ambrose Finland, NP  nystatin cream (MYCOSTATIN) Apply 1 application topically 2 (two) times daily. 01/25/14   Ambrose Finland, NP  ranitidine (ZANTAC) 150 MG tablet Take 1 tablet (150 mg total) by mouth 2 (two) times daily. 05/29/16   Elvina Sidle, MD    Family History Family History  Problem Relation Age of Onset  . Hyperlipidemia Sister     Social History Social History  Substance Use Topics  . Smoking status: Former Smoker    Packs/day: 0.25    Types: Cigarettes    Quit date: 01/25/2012  . Smokeless tobacco: Never Used  . Alcohol use No     Allergies   Cortisone   Review of Systems Review of Systems  Constitutional: Positive for activity change. Negative for appetite change, fatigue and fever.  HENT: Negative.   Respiratory: Negative.   Cardiovascular: Negative.   Gastrointestinal: Positive for abdominal pain. Negative for diarrhea, nausea and vomiting.  Genitourinary: Negative.   Neurological: Negative.      Physical Exam Triage Vital Signs ED Triage Vitals  Enc Vitals Group     BP 05/29/16 1716 (!) 153/89     Pulse Rate 05/29/16 1716 78     Resp --      Temp 05/29/16 1716 100.3 F (37.9 C)     Temp  Source 05/29/16 1716 Oral     SpO2 05/29/16 1716 100 %     Weight --      Height --      Head Circumference --      Peak Flow --      Pain Score 05/29/16 1717 9     Pain Loc --      Pain Edu? --      Excl. in GC? --    No data found.   Updated Vital Signs BP (!) 153/89 (BP Location: Right Arm)   Pulse 78   Temp 100.3 F (37.9 C) (Oral)   SpO2 100%    Physical Exam  Constitutional: She is oriented to person, place, and time. She appears well-developed and well-nourished.  HENT:  Right Ear: External ear normal.  Left Ear: External ear normal.  Mouth/Throat: Oropharynx is clear and moist.  Eyes: Conjunctivae are normal. Pupils are equal, round, and reactive to light.  Neck: Normal  range of motion. Neck supple.  Cardiovascular: Normal rate and regular rhythm.   Pulmonary/Chest: Effort normal.  Abdominal: Soft. Bowel sounds are normal. There is tenderness. There is no rebound and no guarding.  Epigastric tenderness  Musculoskeletal: Normal range of motion.  Neurological: She is alert and oriented to person, place, and time.  Skin: Skin is warm and dry.  Nursing note and vitals reviewed.    UC Treatments / Results  Labs (all labs ordered are listed, but only abnormal results are displayed) Labs Reviewed - No data to display  EKG  EKG Interpretation None       Radiology No results found.  Procedures Procedures (including critical care time)  Medications Ordered in UC Medications  gi cocktail (Maalox,Lidocaine,Donnatal) (30 mLs Oral Given 05/29/16 1800)     Initial Impression / Assessment and Plan / UC Course  I have reviewed the triage vital signs and the nursing notes.  Pertinent labs & imaging results that were available during my care of the patient were reviewed by me and considered in my medical decision making (see chart for details).     Final Clinical Impressions(s) / UC Diagnoses   Final diagnoses:  Gastroesophageal reflux disease without esophagitis    New Prescriptions New Prescriptions   RANITIDINE (ZANTAC) 150 MG TABLET    Take 1 tablet (150 mg total) by mouth 2 (two) times daily.     Elvina Sidle, MD 05/29/16 [phone]

## 2016-05-29 NOTE — ED Triage Notes (Signed)
Pt complains of epigastric pain that radiates to her back and a headache since last night.  Pt denies N, V, or D.

## 2016-05-31 MED FILL — raNITIdine HCL 150 MG TABS: 150 | 30 days supply | Qty: 60 | Fill #0

## 2016-09-03 ENCOUNTER — Ambulatory Visit (INDEPENDENT_AMBULATORY_CARE_PROVIDER_SITE_OTHER): Payer: Self-pay | Admitting: Student in an Organized Health Care Education/Training Program

## 2016-09-03 ENCOUNTER — Encounter: Payer: Self-pay | Admitting: Student in an Organized Health Care Education/Training Program

## 2016-09-03 VITALS — BP 122/84 | HR 63 | Temp 97.9°F | Ht 66.0 in | Wt 243.0 lb

## 2016-09-03 DIAGNOSIS — I10 Essential (primary) hypertension: Secondary | ICD-10-CM

## 2016-09-03 DIAGNOSIS — R600 Localized edema: Secondary | ICD-10-CM

## 2016-09-03 NOTE — Progress Notes (Signed)
   CC: establish care  HPI: Sarah Macdonald is a 51 y.o. female with PMH significant for HTN who [facility] to [facility] care as a new patient.  Chronic LE edema, bilateral - swelling is worse with heat outside, worse with being on feet all day - notes pain with pain with lying down - feels legs are sensitive to cold - no decreased exercise tolerance, no pain with ambulation - previously wore compression stockings but no longer does  Past Medical History:  Diagnosis Date  . Edema leg   . Hyperlipidemia   . Hypertension    History reviewed. No pertinent surgical history. Social History   Social History  . Marital status: Single    Spouse name: N/A  . Number of children: N/A  . Years of education: N/A   Occupational History  . Not on file.   Social History Main Topics  . Smoking status: Former Smoker    Packs/day: 0.25    Types: Cigarettes    Quit date: 01/25/2012  . Smokeless tobacco: Never Used  . Alcohol use No  . Drug use: No  . Sexual activity: Not Currently   Other Topics Concern  . Not on file   Social History Narrative  . No narrative on file    Review of Symptoms:  See HPI for ROS.   CC, SH/smoking status, and VS noted.  Objective: BP 122/84   Pulse 63   Temp 97.9 F (36.6 C) (Oral)   Ht 5\' 6"  (1.676 m)   Wt 243 lb (110.2 kg)   SpO2 95%   BMI 39.22 kg/m  GEN: NAD, alert, cooperative, and pleasant. EYE: no conjunctival injection, pupils equally round and reactive to light ENMT: normal tympanic light reflex, no nasal polyps,no rhinorrhea, no pharyngeal erythema or exudates, poor dentition NECK: full ROM, no thyromegaly RESPIRATORY: clear to auscultation bilaterally with no wheezes, rhonchi or rales, good effort CV: RRR, no m/r/g,1+ bilateral LE edema with strong peripheral pulses appreciated bilaterally GI: soft, non-tender, non-distended, no hepatosplenomegaly SKIN: warm and dry, no rashes or lesions, feet examined and without  lesions/ulcerations/skin breakdown NEURO: II-XII grossly intact, normal gait, peripheral sensation intact PSYCH: AAOx3, appropriate affect  Assessment and plan:  HTN (hypertension) - controlled today - continue current management with lopressor and lasix  Lower extremity edema Bilateral. Likely 2/2 venous stasis. Considered PAD given pain with elevating feet that resolves with placing them on the floor, however with strong peripheral pulses and no reduced exercise tolerance or pain with ambulation, this is less likely. - elevate feet - compression hose - dec sodium intake    Howard PouchLauren Kalib Bhagat, MD,MS,  PGY2 09/03/2016 2:04 PM

## 2016-09-03 NOTE — Assessment & Plan Note (Signed)
Bilateral. Likely 2/2 venous stasis. Considered PAD given pain with elevating feet that resolves with placing them on the floor, however with strong peripheral pulses and no reduced exercise tolerance or pain with ambulation, this is less likely. - elevate feet - compression hose - dec sodium intake

## 2016-09-03 NOTE — Patient Instructions (Signed)
It was a pleasure seeing you today in our clinic. Today we discussed your leg swelling. Here is the treatment plan we have discussed and agreed upon together:  - when possible keep your legs elevated - try to limit the amount of sodium in your diet - compression stockings may help with your swelling  Our clinic's number is [phone]. Please call with questions or concerns about what we discussed today.  Be well, Dr. Mosetta PuttFeng

## 2016-09-03 NOTE — Assessment & Plan Note (Signed)
-   controlled today - continue current management with lopressor and lasix

## 2016-09-09 ENCOUNTER — Other Ambulatory Visit: Payer: Self-pay | Admitting: Student in an Organized Health Care Education/Training Program

## 2016-09-09 DIAGNOSIS — I10 Essential (primary) hypertension: Secondary | ICD-10-CM

## 2016-09-09 NOTE — Telephone Encounter (Signed)
Pt calling to request refill of:  Name of Medication(s):  Lasix and allopurinol Last date of OV: 09-03-16 [facility]   Will route refill request to Clinic RN.  Discussed with patient policy to call pharmacy for future refills.  Also, discussed refills may take up to 48 hours to approve or deny.  Markus JarvisEmily C Pittman  Pt would also like something for stiffness. ep

## 2016-09-13 NOTE — Telephone Encounter (Signed)
2nd request. Pt states she ran out of medication on Saturday. Pt wants these medications refilled today. Please call pt once these medications have been called in. ep

## 2016-09-14 MED ORDER — ALLOPURINOL 100 MG PO TABS: 100.0000 mg | ORAL_TABLET | Freq: Every day | ORAL | 0 refills | Status: DC

## 2016-09-15 ENCOUNTER — Ambulatory Visit (INDEPENDENT_AMBULATORY_CARE_PROVIDER_SITE_OTHER): Payer: Self-pay | Admitting: Family Medicine

## 2016-09-15 ENCOUNTER — Encounter: Payer: Self-pay | Admitting: Family Medicine

## 2016-09-15 VITALS — BP 130/90 | HR 74 | Temp 98.6°F | Ht 66.0 in | Wt 252.0 lb

## 2016-09-15 DIAGNOSIS — I10 Essential (primary) hypertension: Secondary | ICD-10-CM

## 2016-09-15 DIAGNOSIS — R600 Localized edema: Secondary | ICD-10-CM

## 2016-09-15 NOTE — Progress Notes (Signed)
    Subjective:    Patient ID: Sarah Macdonald, female    DOB: December 18, 1965, 51 y.o.   MRN: [id number]   CC: med refills  Patient comes in today asking for med refills. She brought 5 empty bottles of medications stating she no longer takes these medicines- the bottles include mobic x2, HCTZ, and lisinopril. She states she needs her 1 blood pressure med refilled, 1 fluid pill refilled, and 1 pill for her joint pains. She is unable to recall the names of these currently prescribed medications. Unable to access her chart at the time of visit due to EMR issue.   She states her LE are still swollen despite wearing compression stockings. She sleeps sitting up in a chair. States elevating her legs straight causes pain. She denies orthopnea or PND. She sleeps sitting up due to preference not SOB with laying down. She feels she has gained a lot of weight. She stands for prolonged periods for work and feels this makes swelling worse.   Smoking status reviewed- former smoker  Review of Systems- see HPI. Additionally no chest pain.   Objective:  BP 130/90   Pulse 74   Temp 98.6 F (37 C) (Oral)   Ht 5\' 6"  (1.676 m)   Wt 252 lb (114.3 kg)   SpO2 98%   BMI 40.67 kg/m  Vitals and nursing note reviewed  General: well nourished, in no acute distress Cardiac: RRR, clear S1 and S2, no murmurs, rubs, or gallops Respiratory: clear to auscultation bilaterally, no increased work of breathing Extremities: +2 pitting edema bilaterally up to knees Neuro: alert and oriented, no focal deficits  Assessment & Plan:    HTN (hypertension)  Chronic, BP at goal today  -refilled lopressor and lasix -follow up with PCP  Lower extremity edema  Unclear etiology, persistent despite compression stockings  -advised patient to elevate legs at night -follow up with PCP -patient may benefit from echo to r/o heart failure as cause for edema    Return in about 4 weeks (around 10/13/2016).   [name],  DO Family Medicine Resident PGY-2

## 2016-09-16 ENCOUNTER — Other Ambulatory Visit: Payer: Self-pay | Admitting: Family Medicine

## 2016-09-16 ENCOUNTER — Telehealth: Payer: Self-pay | Admitting: Student in an Organized Health Care Education/Training Program

## 2016-09-16 DIAGNOSIS — I10 Essential (primary) hypertension: Secondary | ICD-10-CM

## 2016-09-16 MED ORDER — FUROSEMIDE 40 MG PO TABS: 40.0000 mg | ORAL_TABLET | Freq: Every day | ORAL | 3 refills | Status: DC

## 2016-09-16 MED ORDER — METOPROLOL TARTRATE 25 MG PO TABS: 12.5000 mg | ORAL_TABLET | Freq: Every day | ORAL | 2 refills | Status: DC

## 2016-09-16 NOTE — Telephone Encounter (Signed)
Pt is calling because she was seen by Dr. Wonda Oldsiccio 09/15/16. She was suppose to call in several medications for the patient and nothing was called in. She would like to speak to one of the nurse to see why this hasn't been done. jw

## 2016-09-16 NOTE — Assessment & Plan Note (Signed)
  Unclear etiology, persistent despite compression stockings  -advised patient to elevate legs at night -follow up with PCP -patient may benefit from echo to r/o heart failure as cause for edema

## 2016-09-16 NOTE — Assessment & Plan Note (Addendum)
  Chronic, BP at goal today  -refilled lopressor and lasix -follow up with PCP

## 2016-09-16 NOTE — Telephone Encounter (Signed)
Tried calling patient but there was no VM set up.  Pharmacy will contact her once these meds have been filled.  If patient calls back please inform her of this. Jazmin Hartsell,CMA

## 2016-09-16 NOTE — Telephone Encounter (Signed)
Please call patient to let her know I refilled her medications for her to her preferred pharmacy.

## 2016-09-16 NOTE — Telephone Encounter (Signed)
Will forward to MD to send medications to pharmacy since EMR was down yesterday while the patient was here. Amorah Sebring,CMA

## 2016-09-22 ENCOUNTER — Ambulatory Visit (INDEPENDENT_AMBULATORY_CARE_PROVIDER_SITE_OTHER): Payer: Self-pay | Admitting: Student in an Organized Health Care Education/Training Program

## 2016-09-22 ENCOUNTER — Encounter: Payer: Self-pay | Admitting: Student in an Organized Health Care Education/Training Program

## 2016-09-22 DIAGNOSIS — G8929 Other chronic pain: Secondary | ICD-10-CM

## 2016-09-22 DIAGNOSIS — M25561 Pain in right knee: Secondary | ICD-10-CM

## 2016-09-22 DIAGNOSIS — R600 Localized edema: Secondary | ICD-10-CM

## 2016-09-22 DIAGNOSIS — M25562 Pain in left knee: Secondary | ICD-10-CM

## 2016-09-22 MED ORDER — ACETAMINOPHEN 500 MG PO TABS: 500.0000 mg | ORAL_TABLET | Freq: Three times a day (TID) | ORAL | 0 refills | Status: DC

## 2016-09-22 NOTE — Patient Instructions (Signed)
It was a pleasure seeing you today in our clinic. Today we discussed your knee pain. Here is the treatment plan we have discussed and agreed upon together:  - start taking the tylenol I sent to your pharmacy three times daily - continue your weight loss efforts! Congratulations on your success so far! - If you notice redness, swelling, or warmth of a joint with fevers, these would be reasons to come back to be seen.  Our clinic's number is [phone]. Please call with questions or concerns about what we discussed today.  Be well, Dr. Mosetta PuttFeng

## 2016-09-22 NOTE — Progress Notes (Signed)
   CC: knee stiffness/pain  HPI: Sarah NashGloria A Macdonald is a 51 y.o. female with PMH significant for obesity, HTN who [facility] with knee stiffness and pain of 2 months duration.   Knee stiffness and pain -  When she gets up, has to wait 5 minutes before starts walking - going on for 2 months - stiffness is associated with bilateral knee pain - stiffness is worse with sitting to standing - pain is better with rest - pain is constant, every day - no injury, trauma, or inciting event - takes tylenol EC, once daily - patient endorses exercising regularly and ongoing weight-loss efforts  Leg swelling, bil - patient was seen previously for this problem - compression stockings have helped  - elevation have helped  Review of Symptoms:  See HPI for ROS.   CC, SH/smoking status, and VS noted.  Objective: BP 130/90 (BP Location: Left Arm, Patient Position: Sitting, Cuff Size: Normal)   Pulse 61   Temp (!) 97.5 F (36.4 C) (Oral)   Ht 5\' 6"  (1.676 m)   Wt 245 lb 12.8 oz (111.5 kg)   SpO2 93%   BMI 39.67 kg/m  GEN: NAD, alert, cooperative, and pleasant. MSK: +anterior knee tenderness to palpation bilaterally, good range of motion, no warmth edema redness or signs of infection NEURO: II-XII grossly intact, normal gait, peripheral sensation intact PSYCH: AAOx3, appropriate affect  Assessment and plan:  Lower extremity edema - continue compression stockings and lower extremity elevation - if LE worsens or continues, can consider cardiac echo  Bilateral knee pain History of stiffness and pattern of pain is consistent with osteoarthritis pain. - patient declines imaging of her knees - initiate tylenol 500 mg TID standing for osteoarthritis pain; there is room to go up on this - discussed continued weight loss efforts and exercise - patient not interested in joint injections at this time   Meds ordered this encounter  Medications  . acetaminophen (TYLENOL) 500 MG  tablet    Sig: Take 1 tablet (500 mg total) by mouth 3 (three) times daily.    Dispense:  30 tablet    Refill:  0   Howard PouchLauren Oisin Yoakum, MD,MS,  PGY2 09/27/2016 5:33 PM

## 2016-09-27 DIAGNOSIS — M25561 Pain in right knee: Secondary | ICD-10-CM | POA: Insufficient documentation

## 2016-09-27 DIAGNOSIS — M25562 Pain in left knee: Secondary | ICD-10-CM

## 2016-09-27 NOTE — Assessment & Plan Note (Signed)
-   continue compression stockings and lower extremity elevation - if LE worsens or continues, can consider cardiac echo

## 2016-09-27 NOTE — Assessment & Plan Note (Signed)
History of stiffness and pattern of pain is consistent with osteoarthritis pain. - patient declines imaging of her knees - initiate tylenol 500 mg TID standing for osteoarthritis pain; there is room to go up on this - discussed continued weight loss efforts and exercise - patient not interested in joint injections at this time

## 2016-10-06 ENCOUNTER — Telehealth: Payer: Self-pay | Admitting: Student in an Organized Health Care Education/Training Program

## 2016-10-06 NOTE — Telephone Encounter (Signed)
Pt would like to know what is the best kind of vitamins to take? She is out right now.  Please advise

## 2016-10-07 NOTE — Telephone Encounter (Signed)
Pt informed of below. Zimmerman Rumple, Anaika Santillano D, CMA  

## 2016-10-07 NOTE — Telephone Encounter (Signed)
Please call the patient and let her know she can take any over the counter multivitamin.

## 2016-10-21 ENCOUNTER — Telehealth: Payer: Self-pay | Admitting: Student in an Organized Health Care Education/Training Program

## 2016-10-21 NOTE — Telephone Encounter (Signed)
Received call that patient does not think her fluid pills are working.  Called and spoke with the patient. She states she is having more LE edema, no dypsnea. Patient does not have a history of heart failure, does not have an echo in her chart.  - Patient to be seen in clinic in 6 days, consider scheduling cardiac echo - red flags/return precautions including dyspnea were discussed over the phone  Howard PouchLauren Lailany Enoch, MD PGY-2 [facility]

## 2016-10-21 NOTE — Telephone Encounter (Signed)
Pt would like to have some water pills called to the  Pharmacy. She said that the fluid pills that we gave her is not working. Please call patient and let her know jw

## 2016-10-27 ENCOUNTER — Ambulatory Visit: Payer: Self-pay | Admitting: Student in an Organized Health Care Education/Training Program

## 2016-11-03 ENCOUNTER — Ambulatory Visit: Payer: Self-pay | Admitting: Student in an Organized Health Care Education/Training Program

## 2016-11-11 NOTE — Progress Notes (Signed)
   CC: LE edema  HPI: Sarah Macdonald is a 51 y.o. female with PMH significant for osteoarthritis, hypertension, lower extremity edema who presents to [facility] today with lower extremity edema of several month duration.   Leg swelling - for several months - feels it worsened when she gained weight - feet are less swollen in the morning - worse when on feet all day - no shortness of breath, chest pain, cough, wheezing, no palpitations  HTN - patient takes half of a 12.5 mg metoprolol tablet daily, also takes 40 mg of Lasix daily - Blood pressure is well controlled at today's visit - Denies chest pain, shortness of breath - Asks for refill of metoprolol  Osteoarthritis of the left knee -diagnosed clinically at a previous visit. Patient has been taking 500 mg of Tylenol 3 times a day with some improvement but continues to have pain. Pain is described as stiffness that is worse in the morning and her leg feels stiff until she stands and walks for a couple of minutes. Takes her a minute to get going.   Review of Symptoms:  See HPI for ROS.   CC, SH/smoking status, and VS noted.  Objective: BP 120/85 (BP Location: Right Arm, Patient Position: Sitting, Cuff Size: Normal)   Pulse 86   Temp 98 F (36.7 C) (Oral)   Ht  (1.676 m)   Wt 250 lb 12.8 oz (113.8 kg)   SpO2 91%   BMI 40.48 kg/m  GEN: NAD, alert, cooperative, and pleasant. MSK: 1+ LE edema bilaterally CARD: RRR, no m/r/g PULM: CTA bil, no W/R/R Knee: +ttp L knee anteriorly   Assessment and plan:  Lower extremity edema May just be dependent edema given history of it being improved in the morning and worse at night. Recommend foot elevation, low salt, compression socks. Offered dietetics consult the patient declines. Could consider cardiac echo, however I discussed this with the patient at length and today's visit and she prefers to think about it and not go for this testing at this time.  HTN (hypertension) Refilled  metoprolol  Primary osteoarthritis of left knee Diagnosed clinically at the last visit. Patient has been taking Tylenol 500 mg 3 times a day with some improvement but continues to have pain. - Increase to 1000 mg Tylenol 3 times a day - can consider joint injection with steroids in the future, patient declines at today's visit.   No orders of the defined types were placed in this encounter.   Meds ordered this encounter  Medications  . metoprolol tartrate (LOPRESSOR) 25 MG tablet    Sig: Take 0.5 tablets (12.5 mg total) by mouth daily.    Dispense:  45 tablet    Refill:  2  . acetaminophen (TYLENOL) 500 MG tablet    Sig: Take 2 tablets (1,000 mg total) by mouth every 8 (eight) hours as needed.    Dispense:  120 tablet    Refill:  3     Howard Pouch, MD,MS,  PGY2 11/12/2016 5:00 PM

## 2016-11-12 ENCOUNTER — Encounter: Payer: Self-pay | Admitting: Student in an Organized Health Care Education/Training Program

## 2016-11-12 ENCOUNTER — Ambulatory Visit (INDEPENDENT_AMBULATORY_CARE_PROVIDER_SITE_OTHER): Payer: Self-pay | Admitting: Student in an Organized Health Care Education/Training Program

## 2016-11-12 VITALS — BP 120/85 | HR 86 | Temp 98.0°F | Ht 66.0 in | Wt 250.8 lb

## 2016-11-12 DIAGNOSIS — M170 Bilateral primary osteoarthritis of knee: Secondary | ICD-10-CM | POA: Insufficient documentation

## 2016-11-12 DIAGNOSIS — M1712 Unilateral primary osteoarthritis, left knee: Secondary | ICD-10-CM

## 2016-11-12 DIAGNOSIS — I10 Essential (primary) hypertension: Secondary | ICD-10-CM

## 2016-11-12 DIAGNOSIS — R600 Localized edema: Secondary | ICD-10-CM

## 2016-11-12 HISTORY — DX: Bilateral primary osteoarthritis of knee: M17.0

## 2016-11-12 MED ORDER — METOPROLOL TARTRATE 25 MG PO TABS: 12.5000 mg | ORAL_TABLET | Freq: Every day | ORAL | 2 refills | Status: DC

## 2016-11-12 MED ORDER — ACETAMINOPHEN 500 MG PO TABS: 1000.0000 mg | ORAL_TABLET | Freq: Three times a day (TID) | ORAL | 3 refills | Status: DC | PRN

## 2016-11-12 NOTE — Patient Instructions (Addendum)
It was a pleasure seeing you today in our clinic. Today we discussed your leg swelling. Here is the treatment plan we have discussed and agreed upon together: - continue to elevate her legs, you can use compression stockings, and decreased salt in your diet - If you become interested in speaking with our dietitian, I give your information for scheduling that - We increased her Tylenol to 1000 mg 3 times a day for your arthritis.   Our clinic's number is [phone]. Please call with questions or concerns about what we discussed today.  Be well, Dr. Mosetta Putt

## 2016-11-12 NOTE — Assessment & Plan Note (Signed)
May just be dependent edema given history of it being improved in the morning and worse at night. Recommend foot elevation, low salt, compression socks. Offered dietetics consult the patient declines. Could consider cardiac echo, however I discussed this with the patient at length and today's visit and she prefers to think about it and not go for this testing at this time.

## 2016-11-12 NOTE — Assessment & Plan Note (Addendum)
Diagnosed clinically at the last visit. Patient has been taking Tylenol 500 mg 3 times a day with some improvement but continues to have pain. - Increase to 1000 mg Tylenol 3 times a day - can consider joint injection with steroids in the future, patient declines at today's visit.

## 2016-11-12 NOTE — Assessment & Plan Note (Signed)
Refilled metoprolol 

## 2016-11-16 NOTE — Telephone Encounter (Signed)
Pt would like something for stiffness. She says she is stiff when she gets up in the morning.  She uses [facility] [address].

## 2016-11-17 ENCOUNTER — Other Ambulatory Visit: Payer: Self-pay | Admitting: Student in an Organized Health Care Education/Training Program

## 2016-11-17 DIAGNOSIS — I10 Essential (primary) hypertension: Secondary | ICD-10-CM

## 2016-11-17 MED ORDER — METOPROLOL TARTRATE 25 MG PO TABS: 12.5000 mg | ORAL_TABLET | Freq: Every day | ORAL | 2 refills | Status: DC

## 2016-11-17 MED ORDER — FUROSEMIDE 40 MG PO TABS: 40.0000 mg | ORAL_TABLET | Freq: Every day | ORAL | 3 refills | Status: DC

## 2016-11-17 NOTE — Telephone Encounter (Signed)
Patient has osteoarthritis pain and stiffness. Declines joint injections, declines imaging at this time. She was increased to 1000 mg tylenol TID at last visit but has only been taking 500 mg TID.  Encouraged patient to increase to recommended dosing of tylenol. She agrees. Refilled lasix and metoprolol because patient states she is running out of these medications.  Patient can schedule an office visit for further management if she continues to have pain and stiffness at increased dose of tylenol.

## 2016-11-17 NOTE — Progress Notes (Signed)
Patient has osteoarthritis pain and stiffness. Declines joint injections, declines imaging at this time. She was increased to 1000 mg tylenol TID at last visit but has only been taking 500 mg TID.  Encouraged patient to increase to recommended dosing of tylenol. She agrees. Refilled lasix and metoprolol because patient states she is running out of these medications.  Patient can schedule an office visit for further management if she continues to have pain and stiffness at increased dose of tylenol. 

## 2017-02-15 ENCOUNTER — Telehealth: Payer: Self-pay | Admitting: Licensed Clinical Social Worker

## 2017-02-15 NOTE — Progress Notes (Signed)
Social work consult from [employer] reference patient needs assistance with obtaining BP medication.  LCSW called patient to assess needs and barriers to obtaining medication.  Patient no longer employed and does not have money to obtain medication.  Patient has one pill left.  LCSW reviewed resources and provided contact information for Medication and Assistance Program.  Also called [facility] paid $7.37 for patient's medication [facility].  Patient thankful for assistance.   Intervention: emotional support, [employer], [facility] Indigent [employer] Voucher: $7.37   Plan: Patient will pick up medication from [facility] today.  Patient will also contact MAP for ongoing assistance with medication.  Sammuel Hineseborah Daran Favaro, LCSW Licensed Clinical Social Worker [facility]   [phone] 11:10 AM

## 2017-03-07 ENCOUNTER — Telehealth: Payer: Self-pay | Admitting: Student in an Organized Health Care Education/Training Program

## 2017-03-07 NOTE — Telephone Encounter (Signed)
Pt wants to talk to the nurse about cramps in her legs. She needs to be called today and before 5 at the number given.

## 2017-03-08 NOTE — Telephone Encounter (Signed)
Returned call to number provided. Left message with woman answering the phone and left VM on home number to return call to discuss. Kinnie FeilL. Truly Stankiewicz, RN, BSN

## 2017-03-09 ENCOUNTER — Ambulatory Visit: Payer: Medicaid Other | Admitting: Student in an Organized Health Care Education/Training Program

## 2017-03-09 NOTE — Telephone Encounter (Signed)
Pt returned call. States she's had bilat leg cramps and leg swelling intermittently for 3 weeks. 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 States she eats a banana everyday. Appt made with PCP to discuss on 03/14/2017 at 4:15. Kinnie FeilL. Marytza Grandpre, RN, BSN

## 2017-03-14 ENCOUNTER — Other Ambulatory Visit: Payer: Self-pay

## 2017-03-14 ENCOUNTER — Ambulatory Visit (INDEPENDENT_AMBULATORY_CARE_PROVIDER_SITE_OTHER): Payer: Self-pay | Admitting: Student in an Organized Health Care Education/Training Program

## 2017-03-14 ENCOUNTER — Encounter: Payer: Self-pay | Admitting: Student in an Organized Health Care Education/Training Program

## 2017-03-14 VITALS — BP 128/82 | HR 65 | Temp 97.8°F | Ht 66.0 in | Wt 266.6 lb

## 2017-03-14 DIAGNOSIS — R600 Localized edema: Secondary | ICD-10-CM

## 2017-03-14 DIAGNOSIS — R252 Cramp and spasm: Secondary | ICD-10-CM

## 2017-03-14 DIAGNOSIS — G4762 Sleep related leg cramps: Secondary | ICD-10-CM | POA: Insufficient documentation

## 2017-03-14 DIAGNOSIS — B372 Candidiasis of skin and nail: Secondary | ICD-10-CM | POA: Insufficient documentation

## 2017-03-14 MED ORDER — NYSTATIN 100000 UNIT/GM EX CREA: 1.0000 "application " | TOPICAL_CREAM | Freq: Two times a day (BID) | CUTANEOUS | 0 refills | Status: AC

## 2017-03-14 NOTE — Progress Notes (Signed)
   CC: LE cramping  HPI: Amado NashGloria A Lavin is a 52 y.o. female    Cramps bilateral lower legs - on and off, started 1 week ago - worse at night when sitting - improved with leg elevation - drinks plenty of water and gatorade - no CP or palpitations  LE swelling - wears compression socks, finds they help - worse with being on feet - no chest pain, dyspnea, or shortness of breath, no PND  Under breast rash - bilateral, chronic - was previously prescribed nystatin which she says helps - no fevers, no drainage or surrounding eyrthema - worse with sweating - would like refill on nystatin  Review of Symptoms:  See HPI for ROS.   CC, SH/smoking status, and VS noted.  Objective: BP 128/82   Pulse 65   Temp 97.8 F (36.6 C) (Oral)   Ht 5\' 6"  (1.676 m)   Wt 266 lb 9.6 oz (120.9 kg)   SpO2 97%   BMI 43.03 kg/m  GEN: NAD, alert, cooperative, and pleasant. NECK: full ROM, no thyromegaly RESPIRATORY: clear to auscultation bilaterally with no wheezes, rhonchi or rales, good effort CV: RRR, no m/r/g GI: soft, non-tender, non-distended, no hepatosplenomegaly SKIN: warm and dry, +bilateral skin darkening below the breast consistent with yeast, no surrounding erythema, warmth, drainage or induration NEURO: II-XII grossly intact, normal gait, peripheral sensation intact PSYCH: AAOx3, appropriate affect EXT: +1+ bilateral pitting edema with no calf tenderness, up to knee  Assessment and plan:  Skin yeast infection Under the breasts bilaterally. Patient reports nystatin has helped in the past. - refilled nystatin cream  Lower extremity edema Ongoing issue for the patient despite decreasing salt in her diet, elevating legs, walking regularly, wearing compression stockings. She is hesitant to get a cardiac ultrasound because she said it scares her. Discussed ultrasound with her, she is agreeable to scheduling. Will also check CBC to see if anemia contributes to LE edema.  Leg  cramping Uncertain etiology. May be due to electrolyte imbalance. WIll check BMP, TSH, Mag.    Orders Placed This Encounter  Procedures  . Basic metabolic panel  . CBC  . Magnesium  . TSH  . Vitamin B12  . ECHOCARDIOGRAM COMPLETE    Standing Status:   Future    Standing Expiration Date:   06/12/2018    Order Specific Question:   Where should this test be performed    Answer:   [place]    Order Specific Question:   Perflutren DEFINITY (image enhancing agent) should be administered unless hypersensitivity or allergy exist    Answer:   Administer Perflutren    Order Specific Question:   Expected Date:    Answer:   ASAP    Meds ordered this encounter  Medications  . nystatin cream (MYCOSTATIN)    Sig: Apply 1 application topically 2 (two) times daily for 14 days. Apply to rash 4 times daily for 2 weeks.    Dispense:  28 g    Refill:  0    Howard PouchLauren Abdulahad Mederos, MD,MS,  PGY2 03/14/2017 5:00 PM

## 2017-03-14 NOTE — Assessment & Plan Note (Addendum)
Ongoing issue for the patient despite decreasing salt in her diet, elevating legs, walking regularly, wearing compression stockings. She is hesitant to get a cardiac ultrasound because she said it scares her. Discussed ultrasound with her, she is agreeable to scheduling. Will also check CBC to see if anemia contributes to LE edema.

## 2017-03-14 NOTE — Assessment & Plan Note (Signed)
Under the breasts bilaterally. Patient reports nystatin has helped in the past. - refilled nystatin cream

## 2017-03-14 NOTE — Patient Instructions (Signed)
It was a pleasure seeing you today in our clinic. Here is the treatment plan we have discussed and agreed upon together:  We drew blood work at today's visit. I will call or send you a letter with these results. If you do not hear from me within the next week, please give our office a call.  Please go to your cardiac echo.  Our clinic's number is [phone]. Please call with questions or concerns about what we discussed today.  Be well, Dr. Mosetta PuttFeng

## 2017-03-14 NOTE — Assessment & Plan Note (Signed)
Uncertain etiology. May be due to electrolyte imbalance. WIll check BMP, TSH, Mag.

## 2017-03-15 ENCOUNTER — Encounter: Payer: Self-pay | Admitting: Student in an Organized Health Care Education/Training Program

## 2017-03-15 LAB — CBC
Hematocrit: 37.2 % (ref 34.0–46.6)
Hemoglobin: 12.1 g/dL (ref 11.1–15.9)
MCH: 29.4 pg (ref 26.6–33.0)
MCHC: 32.5 g/dL (ref 31.5–35.7)
MCV: 90 fL (ref 79–97)
Platelets: 373 10*3/uL (ref 150–379)
RBC: 4.12 x10E6/uL (ref 3.77–5.28)
RDW: 14.7 % (ref 12.3–15.4)
WBC: 6.8 10*3/uL (ref 3.4–10.8)

## 2017-03-15 LAB — VITAMIN B12: VITAMIN B 12: 798 pg/mL (ref 232–1245)

## 2017-03-15 LAB — BASIC METABOLIC PANEL
BUN/Creatinine Ratio: 15 (ref 9–23)
BUN: 13 mg/dL (ref 6–24)
CO2: 25 mmol/L (ref 20–29)
Calcium: 9.8 mg/dL (ref 8.7–10.2)
Chloride: 102 mmol/L (ref 96–106)
Creatinine, Ser: 0.87 mg/dL (ref 0.57–1.00)
GFR calc Af Amer: 89 mL/min/{1.73_m2} (ref >59)
GFR calc non Af Amer: 77 mL/min/{1.73_m2} (ref >59)
GLUCOSE: 98 mg/dL (ref 65–99)
Potassium: 4.4 mmol/L (ref 3.5–5.2)
Sodium: 145 mmol/L — ABNORMAL HIGH (ref 134–144)

## 2017-03-15 LAB — MAGNESIUM: Magnesium: 2.3 mg/dL (ref 1.6–2.3)

## 2017-03-15 LAB — TSH: TSH: 1.73 u[IU]/mL (ref 0.450–4.500)

## 2017-03-17 ENCOUNTER — Ambulatory Visit (HOSPITAL_COMMUNITY): Admission: RE | Admit: 2017-03-17 | Payer: Self-pay | Source: Ambulatory Visit

## 2017-07-01 ENCOUNTER — Ambulatory Visit: Payer: Self-pay | Admitting: Student in an Organized Health Care Education/Training Program

## 2017-07-09 ENCOUNTER — Encounter

## 2017-07-17 ENCOUNTER — Telehealth: Payer: Self-pay | Admitting: Student in an Organized Health Care Education/Training Program

## 2017-07-17 DIAGNOSIS — I10 Essential (primary) hypertension: Secondary | ICD-10-CM

## 2017-07-21 NOTE — Telephone Encounter (Signed)
Pt needs her metoprolol pills but she cannot pay for them. She doesn't have the $7.48 to pay for them and cannot borrow the money. She ran out of her pills yesterday. Please advise

## 2017-07-22 MED ORDER — METOPROLOL TARTRATE 25 MG PO TABS: 12.5000 mg | ORAL_TABLET | Freq: Every day | ORAL | 0 refills | Status: DC

## 2017-07-22 NOTE — Telephone Encounter (Signed)
LCSW returned call to patient. She does not have money for her medication, has not applied for MAP assistance and does not have transportation to [facility].  [facility] understands [facility] is not able to assist with medication going forward and this is the 2nd time she has received assistance.     [facility] and [facility]'s [facility].  Will call patient once medication is ready for pickup.  LCSW called patient and provided an update.  Patient excited an appreciative of assistance.  Sammuel Hineseborah Tafari Humiston, LCSW Licensed Clinical Social Worker [facility]   [phone] 2:19 PM

## 2017-07-22 NOTE — Telephone Encounter (Signed)
I am covering Dr. Latanya MaudlinFeng's inbox. Will [facility]d supply [facility] via [facility] indigent fund. Gavin PoundDeborah, could you please call patient and assess this situation and whether we need to provide other support or help her with financial assistance?

## 2017-07-22 NOTE — Telephone Encounter (Signed)
Type of Service: Clinical Social Work  Social work consult from Dr. Chanetta Marshallimberlake reference patient needing medication.  Dr. Chanetta Marshallimberlake called 90 supply [facility] to cover cost.  LCSW has previously spoken to patient about applying for the Medication Assistance program (MAP) when she received assistance with medication in Jan.   LCSW called pharmacy, the [address] program [facility] will not cover the cost for 90 days only 30 days. However due to patient having family planning medicaid it will not cover the cost.  The cost for 30 days is $9.00    LCSW called patient to assess the need, see if she has applied for MAP and provide an update on the medication.  Patient was not home. Updated Provider via in basket message.  Assessment/Plan: LCSW will call patient back to assess ongoing need for medication and to see if she has applied for MAP . Will pay for medication from CSW fund once able to contact patient.  Sammuel Hineseborah Moore, LCSW Licensed Clinical Social Worker [facility]   [phone] 10:03 AM

## 2017-07-22 NOTE — Addendum Note (Signed)
Addended by: Shon HaleIMBERLAKE, Candita Borenstein S on: 07/22/2017 08:32 AM   Modules accepted: Orders

## 2017-07-22 NOTE — Telephone Encounter (Signed)
Pt called nurse line returning call from LCSW. I informed pt I will let LCSW know pt is returning her call. I informed patient she could pick up a 30 day supply for 9.00. Pt stated, "I have no money and no form of transpertation." Please give her a call back.

## 2017-08-19 ENCOUNTER — Ambulatory Visit: Payer: Self-pay | Admitting: Student in an Organized Health Care Education/Training Program

## 2017-10-12 ENCOUNTER — Ambulatory Visit (INDEPENDENT_AMBULATORY_CARE_PROVIDER_SITE_OTHER): Payer: Self-pay | Admitting: Family Medicine

## 2017-10-12 ENCOUNTER — Other Ambulatory Visit: Payer: Self-pay

## 2017-10-12 DIAGNOSIS — B372 Candidiasis of skin and nail: Secondary | ICD-10-CM

## 2017-10-12 MED ORDER — CLOTRIMAZOLE 1 % EX CREA: 1.0000 "application " | TOPICAL_CREAM | Freq: Two times a day (BID) | CUTANEOUS | 1 refills | Status: DC

## 2017-10-12 NOTE — Patient Instructions (Signed)
It was nice meeting you today!  You were seen in clinic for a rash under your breast and stomach which is most likely Candida yeast.  I am prescribing you a topical ointment to you can apply twice daily to the affected areas.  Additionally as we discussed, it is advisable to wear loose clothing and breathable cottons/linens as this usually occurs in areas that sweat. Make sure that your skin is completely dry after bathing before you dressed.  Please call clinic if you have any questions.  Be well,  Freddrick March MD

## 2017-10-12 NOTE — Assessment & Plan Note (Signed)
Exam c/w candidal intertrigo.  Rash present beneath breasts bilaterally and in groin area. Advised wearing breathable cottons and making sure skin is completely dry after bathing.  -Rx: Clotrimazole ointment to be applied twice daily to affected areas -Return precautions discussed

## 2017-10-12 NOTE — Progress Notes (Signed)
   Subjective:   Patient ID: Sarah Macdonald    DOB: May 10, 1965, 52 y.o. female   MRN: [id number]  CC: rash under stomach and breasts   HPI: Sarah Macdonald is a 52 y.o. female who presents to clinic today for the following issue.  Rash Patient reports a rash that started earlier this week.  Comes and goes, is itchy.  Localized to under her breasts and within skin folds of stomach as well as her groin area.  She was previously using a topical cream but has run out of it.  Rash is worse now that the weather is warm and she is sweating more.  She does not wear any tight clothing and mostly wears loosefitting items.  No fever, chills, nausea, vomiting.  No shortness of breath, chest pain, leg swelling.  ROS: See HPI for pertinent ROS.  Social: Patient is a former smoker.  Medications reviewed. Objective:   BP (!) 150/92   Pulse 60   Temp 98 F (36.7 C) (Oral)   Ht 5\' 6"  (1.676 m)   Wt 258 lb 12.8 oz (117.4 kg)   SpO2 97%   BMI 41.77 kg/m  Vitals and nursing note reviewed.  General: 52 year old female, NAD CV: RRR no MRG  Lungs: CTAB, normal effort  Abdomen: soft, NTND, +bs Skin: warm, dry, diffuse erythematous rash with some skin breakdown noted beneath bilateral breasts and in folds of groin, mildly tender to palpation, no surrounding erythema, swelling or drainage Extremities: warm and well perfused, normal tone Neuro: alert, oriented x3, no focal deficits   Assessment & Plan:   Skin yeast infection Exam c/w candidal intertrigo.  Rash present beneath breasts bilaterally and in groin area. Advised wearing breathable cottons and making sure skin is completely dry after bathing.  -Rx: Clotrimazole ointment to be applied twice daily to affected areas -Return precautions discussed  Meds ordered this encounter  Medications  . clotrimazole (CLOTRIMAZOLE ANTI-FUNGAL) 1 % cream    Sig: Apply 1 application topically 2 (two) times daily.    Dispense:  30 g    Refill:  1    Follow-up: If symptoms worsen or do not improve  Freddrick March, MD [facility], PGY-3 10/12/2017 4:05 PM

## 2017-10-17 ENCOUNTER — Telehealth: Payer: Self-pay | Admitting: Student in an Organized Health Care Education/Training Program

## 2017-10-17 NOTE — Telephone Encounter (Signed)
Pt was seen last week and she said she is still having some pain and would like to know if she could have something sent for pain to her pharmacy. She would like for someone to call her at [phone] and let her know if they are able to do this for her.

## 2017-10-18 NOTE — Telephone Encounter (Signed)
Unfortunately I cannot send in pain medication for the patient without evaluating her. Please advise her to schedule an appointment to be seen.

## 2017-10-18 NOTE — Telephone Encounter (Signed)
LVM at number left in original note asking pt to call office back to give her the below information and to assist her in scheduling an appointment. Lamonte Sakai, April D, [place]

## 2017-12-07 ENCOUNTER — Other Ambulatory Visit: Payer: Self-pay | Admitting: Student in an Organized Health Care Education/Training Program

## 2017-12-07 DIAGNOSIS — I10 Essential (primary) hypertension: Secondary | ICD-10-CM

## 2017-12-08 ENCOUNTER — Ambulatory Visit (INDEPENDENT_AMBULATORY_CARE_PROVIDER_SITE_OTHER): Payer: Self-pay | Admitting: Family Medicine

## 2017-12-08 ENCOUNTER — Telehealth: Payer: Self-pay | Admitting: Student in an Organized Health Care Education/Training Program

## 2017-12-08 ENCOUNTER — Telehealth: Payer: Self-pay

## 2017-12-08 ENCOUNTER — Ambulatory Visit: Payer: Medicaid Other | Admitting: Student in an Organized Health Care Education/Training Program

## 2017-12-08 VITALS — BP 130/80 | HR 90 | Temp 97.9°F | Wt 253.0 lb

## 2017-12-08 DIAGNOSIS — M79605 Pain in left leg: Secondary | ICD-10-CM

## 2017-12-08 DIAGNOSIS — M79604 Pain in right leg: Secondary | ICD-10-CM

## 2017-12-08 MED ORDER — MULTIVITAMIN WOMEN 50+ PO TABS: 1.0000 | ORAL_TABLET | Freq: Every day | ORAL | 2 refills | Status: DC

## 2017-12-08 MED ORDER — METOPROLOL TARTRATE 25 MG PO TABS: 12.5000 mg | ORAL_TABLET | Freq: Every day | ORAL | 0 refills | Status: DC

## 2017-12-08 NOTE — Assessment & Plan Note (Signed)
Patient with bilateral leg pain that she feels is in her bones.  Patient has been woken up from night for this but with no history of night sweats or weight loss unlikely to be malignancy.  Patient with normal calcium in 03/2017, so unlikely hypercalcemia.  Patient could have restless leg syndrome, although this is not typical presentation.  Unlikely neuropathic pain given the symptoms. Could be due to PVD given smoking history and difficult to palpate distal pulses. - Obtain ferritin (future lab per patient request) to determine if supplementation may help with possible restless legs - Obtain ABI's - Patient to continue tylenol and will try Aspercreme in addition. - Encouraged to continue wearing compression stockings as that has helped with her edema.  - Patient to return to see PCP if pain continues

## 2017-12-08 NOTE — Progress Notes (Signed)
  Subjective:    Patient ID: Sarah Macdonald, female    DOB: 03-30-1965, 52 y.o.   MRN: [id number]   CC: bones in legs hurting  HPI: Bilateral legs pain: Patient reports that anytime she feels cold that she is having pain over her shins.  Reports that it feels like bone pain in both of her legs.  Describes the pain as dull pain, no burning or tingling.  Patient reports that this pain has been waking her up at night.  She reports that her brother keeps his house colds that she believes this is why.   Patient reports that she takes Tylenol 1000 mg 3 times per day in order to help with her pain.  Patient reports that this does help but it is still occurring.  Reports that she has had this bone pain for a few weeks now.  Does not believe that it is worse at night, and is only worse when something cold is hitting her legs.   Patient denies any recent change of medications. Patient does deny night sweats and any recent unintentional weight loss.   Smoking status reviewed-started smoking at 15 and stopped at age 20.  Patient smoked 2 packs/day.  ROS: 10 point ROS is otherwise negative, except as mentioned in HPI  Patient Active Problem List   Diagnosis Date Noted  . Bilateral leg pain 12/08/2017  . Skin yeast infection 03/14/2017  . Leg cramping 03/14/2017  . Primary osteoarthritis of left knee 11/12/2016  . Bilateral knee pain 09/27/2016  . Obesity, unspecified 08/03/2013  . 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 GERD (gastroesophageal reflux disease) 11/21/2012  . HTN (hypertension) 08/18/2012     Objective:  BP 130/80   Pulse 90   Temp 97.9 F (36.6 C) (Oral)   Wt 253 lb (114.8 kg)   SpO2 100%   BMI 40.84 kg/m  Vitals and nursing note reviewed  General: NAD, pleasant Respiratory: normal effort Abdomen: soft, nontender, nondistended Extremities: no edema or cyanosis. WWP.  Nontender.  FROM of BL LE, no calf tenderness. 2+ dorsalis pedis pulse noted on LLE, not  palpable on R, unable to palpate posterior tibialis pulses bilaterally Skin: warm and dry, no rashes noted, no erythema noted on BL LE Neuro: alert and oriented, no focal deficits Psych: normal affect  Assessment & Plan:    Bilateral leg pain Patient with bilateral leg pain that she feels is in her bones.  Patient has been woken up from night for this but with no history of night sweats or weight loss unlikely to be malignancy.  Patient with normal calcium in 03/2017, so unlikely hypercalcemia.  Patient could have restless leg syndrome, although this is not typical presentation.  Unlikely neuropathic pain given the symptoms. Could be due to PVD given smoking history and difficult to palpate distal pulses. - Obtain ferritin (future lab per patient request) to determine if supplementation may help with possible restless legs - Obtain ABI's - Patient to continue tylenol and will try Aspercreme in addition. - Encouraged to continue wearing compression stockings as that has helped with her edema.  - Patient to return to see PCP if pain continues     [place] Daven Pinckney, DO Family Medicine Resident PGY-2

## 2017-12-08 NOTE — Telephone Encounter (Signed)
Called and spoke with the patient to clarify whether she has been taking her blood pressure medications regularly.  Received refill request.  She reports that she is been taking her Lasix and Lopressor every day.  Refilled medications as requested.

## 2017-12-08 NOTE — Patient Instructions (Signed)
Thank you for coming to see me today. It was a pleasure! Today we talked about:   For you leg pain. Continue your tylenol 1000 mg every 8 hours. Please come in for a lab test of your ferritin level to see if restless legs could be the cause. Continue wearing your compression stockings.   Please have the ultrasound done of your legs.   Please follow-up with your in regular doctor or sooner as needed.  If you have any questions or concerns, please do not hesitate to call the office at [phone].  Take Care,   [place] Perline Awe, DO

## 2017-12-08 NOTE — Telephone Encounter (Signed)
Attempted to call patient to inform her of her UltraSound appointment on 1300 hours/ Wednesday 12/14/2017 at [facility]. Show time is 1245.    When patient calls back, please inform her of the above information.  Thanks,  .Glennie Hawk, CMA

## 2017-12-14 ENCOUNTER — Ambulatory Visit (HOSPITAL_COMMUNITY): Admission: RE | Admit: 2017-12-14 | Payer: Self-pay | Source: Ambulatory Visit

## 2017-12-17 ENCOUNTER — Other Ambulatory Visit: Payer: Self-pay | Admitting: Student in an Organized Health Care Education/Training Program

## 2017-12-17 DIAGNOSIS — I10 Essential (primary) hypertension: Secondary | ICD-10-CM

## 2018-01-10 ENCOUNTER — Telehealth: Payer: Self-pay

## 2018-01-10 NOTE — Telephone Encounter (Signed)
Pt calling to ask Dr. Mosetta PuttFeng if an arthritis medication can be called in for her. Tylenol doesn't help and when she feels cold air her legs start hurting.  She doesn't want to be seen just wants to know what she can take to help the pain. Please advise. Sarah SpillersSharon T Alisabeth Macdonald, CMA

## 2018-01-12 NOTE — Telephone Encounter (Signed)
Lets get her an appointment to eval her pain

## 2018-01-12 NOTE — Telephone Encounter (Signed)
Pt calling again asking for some pain medication. Told pt she would probably need to be seen in order to get any type of medication, but pt states "no, I have already been seen about my legs, I just needs some pain medicine." Please call pt to discuss.

## 2018-01-13 NOTE — Telephone Encounter (Signed)
Contacted pt and tried to schedule her an appointment and she said I am going to be all right, I just need some medicine.  She said she would be alright. Instructed her that if she got worse to call us and we would get her an appointment. Lamonte SakaiZimmerman Rumple, April D, [place]MA

## 2018-03-01 ENCOUNTER — Other Ambulatory Visit: Payer: Self-pay | Admitting: Student in an Organized Health Care Education/Training Program

## 2018-03-01 DIAGNOSIS — I10 Essential (primary) hypertension: Secondary | ICD-10-CM

## 2018-04-04 ENCOUNTER — Other Ambulatory Visit: Payer: Self-pay

## 2018-04-04 ENCOUNTER — Other Ambulatory Visit: Payer: Self-pay | Admitting: Family Medicine

## 2018-04-04 MED ORDER — CLOTRIMAZOLE 1 % EX CREA: 1.0000 "application " | TOPICAL_CREAM | Freq: Two times a day (BID) | CUTANEOUS | 0 refills | Status: DC

## 2018-04-04 NOTE — Telephone Encounter (Signed)
Agreed, also last seen for this problem in September so would need to be evaluated to make sure it is yeast.  Please have her schedule an appointment, thanks

## 2018-04-04 NOTE — Telephone Encounter (Signed)
Pt called nurse line stating she would like a refill on the cream that was given to her at her last visit, for yeast under her breast. This medication was originally prescribed by [facility], will forward to her. I did inform patient this was not an active medication on her med list, and she may need an apt.

## 2018-05-21 ENCOUNTER — Other Ambulatory Visit: Payer: Self-pay | Admitting: Student in an Organized Health Care Education/Training Program

## 2018-05-21 DIAGNOSIS — I10 Essential (primary) hypertension: Secondary | ICD-10-CM

## 2018-05-23 NOTE — Telephone Encounter (Signed)
Pt calls because she took her last pills today.   Text page to Dr. Mosetta Putt.

## 2018-08-07 ENCOUNTER — Other Ambulatory Visit: Payer: Self-pay | Admitting: Student in an Organized Health Care Education/Training Program

## 2018-08-07 DIAGNOSIS — I10 Essential (primary) hypertension: Secondary | ICD-10-CM

## 2018-08-16 ENCOUNTER — Telehealth (INDEPENDENT_AMBULATORY_CARE_PROVIDER_SITE_OTHER): Payer: Self-pay | Admitting: Family Medicine

## 2018-08-16 ENCOUNTER — Other Ambulatory Visit: Payer: Self-pay

## 2018-08-16 DIAGNOSIS — R448 Other symptoms and signs involving general sensations and perceptions: Secondary | ICD-10-CM

## 2018-08-16 DIAGNOSIS — R6889 Other general symptoms and signs: Secondary | ICD-10-CM

## 2018-08-16 NOTE — Progress Notes (Signed)
[place] Telemedicine Visit  Patient consented to have virtual visit. Method of visit: Telephone  Encounter participants: Patient: Sarah Macdonald - located at home Provider: Daisy Floro - located at work from home Others (if applicable): none  Chief Complaint: pain in shins with cold temperatures  HPI: Patient has been having pain in her shins when she goes to her brother's house because it's so cold. He keeps the [facility] and the fan on at the same times. The patient is currently between housing and spends a good deal of time at her brother's house. The patient reports the pain is a dull sensation to her shins bilaterally. When she has the pain she does not lose any function, strength or sensation in her feet, knees or thighs. The pain does not spread anywhere. She denies any back pains or falls. She does have a history of leg and calf cramps but says this sensation is different. The patient feels better when she goes outside to sit or walk in the heat. She also reports feeling better when she puts a blanket over her legs. The patient also denies fevers, headaches, chills, vision changes, nausea, vomiting, change in appetite, change in weight, diarrhea, constipation, fatigue, and focal weakness.   ROS: per HPI  Pertinent PMHx: HTN, GERD , leg cramps, Hyperlipidemia, Obesity   Exam:  Respiratory: normal work of breathing, speaking in full sentences  Assessment/Plan: Feels cold Primarily in her shins while inside at her brother's house, which is usually kept very cool. There are no other symptoms to suggest ominous etiology such as PVD, PAD, thyroid dysfunction or neurological cause. I truly feel the patient is experiencing a normal feeling of cold, which resolves once she warms her legs by going outside or using a blanket. -Encouraged the patient to wear tall socks/pants while visiting her brother -encouraged her to follow up with [place] should she experience any  changes to her symptoms, such as sustained loss of sensation when she's not cold or extremity weakness    Time spent during visit with patient: 9 minutes  Milus Banister, [place], PGY-2 08/18/2018 12:40 PM

## 2018-08-18 DIAGNOSIS — R448 Other symptoms and signs involving general sensations and perceptions: Secondary | ICD-10-CM | POA: Insufficient documentation

## 2018-08-18 NOTE — Assessment & Plan Note (Addendum)
Primarily in her shins while inside at her brother's house, which is usually kept very cool. There are no other symptoms to suggest ominous etiology such as PVD, PAD, thyroid dysfunction or neurological cause. I truly feel the patient is experiencing a normal feeling of cold, which resolves once she warms her legs by going outside or using a blanket. -Encouraged the patient to wear tall socks/pants while visiting her brother -encouraged her to follow up with [place] should she experience any changes to her symptoms, such as sustained loss of sensation when she's not cold or extremity weakness

## 2018-10-26 ENCOUNTER — Other Ambulatory Visit: Payer: Self-pay | Admitting: *Deleted

## 2018-10-26 DIAGNOSIS — I10 Essential (primary) hypertension: Secondary | ICD-10-CM

## 2018-10-27 MED ORDER — METOPROLOL TARTRATE 25 MG PO TABS: ORAL_TABLET | ORAL | 0 refills | Status: DC

## 2018-11-03 ENCOUNTER — Ambulatory Visit: Payer: Medicaid Other | Admitting: Family Medicine

## 2018-11-13 ENCOUNTER — Telehealth: Payer: Self-pay | Admitting: Family Medicine

## 2018-11-13 NOTE — Telephone Encounter (Signed)
Error

## 2018-11-14 ENCOUNTER — Ambulatory Visit: Payer: Medicaid Other | Admitting: Family Medicine

## 2018-11-20 ENCOUNTER — Ambulatory Visit (INDEPENDENT_AMBULATORY_CARE_PROVIDER_SITE_OTHER): Payer: Self-pay | Admitting: Family Medicine

## 2018-11-20 ENCOUNTER — Ambulatory Visit: Payer: Medicaid Other | Admitting: Family Medicine

## 2018-11-20 ENCOUNTER — Other Ambulatory Visit: Payer: Self-pay

## 2018-11-20 VITALS — BP 157/90 | HR 55 | Wt 268.2 lb

## 2018-11-20 DIAGNOSIS — I10 Essential (primary) hypertension: Secondary | ICD-10-CM

## 2018-11-20 DIAGNOSIS — R600 Localized edema: Secondary | ICD-10-CM

## 2018-11-20 DIAGNOSIS — M79672 Pain in left foot: Secondary | ICD-10-CM

## 2018-11-20 DIAGNOSIS — I872 Venous insufficiency (chronic) (peripheral): Secondary | ICD-10-CM | POA: Insufficient documentation

## 2018-11-20 MED ORDER — MULTIVITAMIN WOMEN 50+ PO TABS: 1.0000 | ORAL_TABLET | Freq: Every day | ORAL | 2 refills | Status: DC

## 2018-11-20 MED ORDER — FUROSEMIDE 40 MG PO TABS: 40.0000 mg | ORAL_TABLET | Freq: Every day | ORAL | 0 refills | Status: DC

## 2018-11-20 MED ORDER — METOPROLOL TARTRATE 25 MG PO TABS: ORAL_TABLET | ORAL | 0 refills | Status: DC

## 2018-11-20 NOTE — Patient Instructions (Addendum)
It was wonderful meeting you today.  Please avoid your triggers for your feet.  I encourage you to stay physically active on a regular basis.  Also, please make sure you get some compression stockings to help with your lower extremity swelling.  If you start experiencing any numbness, weakness, or worsening of your symptoms please make sure you follow-up.  I would like you to follow-up in the next 2 weeks with your primary care provider for your hypertension.  Please start keeping a journal of your blood pressures you take at home and bring this in along with your cuff to your follow-up visit.

## 2018-11-20 NOTE — Progress Notes (Signed)
Subjective:    Patient ID: Sarah Macdonald, female    DOB: Apr 11, 1965, 53 y.o.   MRN: [id number]   CC: foot pain   HPI: Sarah Macdonald is a 53 year old female with hypertension, GERD, gout, and history of leg/knee pain present discuss the following:  Foot pain: Insidiously for the past several months.  Has been living with her brother, and keeps a fan on at all times at his house.  Notices her left foot will hurt whenever the fan blows onto it.  Denies any other regions that will hurt with the cold air.  Cannot really characterize the pain, says "maybe sharp "that will last for few minutes and resolve when she is out of the fans way.  Mainly on the dorsal surface of her foot, does not appear to be localized to joints.  Denies any numbness/tingling, weakness, rash associated with it.  Had a history of lower extremity cramping which is not a complaint for her anymore.  Also has a chronic history of bilateral lower extremity edema, says it was significantly improved with compression stockings that she does not have at this current time.   Hypertension: Elevated in the office today, asymptomatic.  Denies any lightheadedness/dizziness, chest pain, shortness of breath.  Takes it every day in the morning at home, thinks it is usually 902 systolic however she is not sure.  Says usually "it is low, it's good."   Lower extremity edema: Chronic. Currently takes Lasix on a daily basis.  Used to wear compression stockings which were quite effective for her.  She has had previous work-up started for this, however never had an echocardiogram and declines this at this time.  Additionally had bilateral leg pain several months ago for which they try to obtain ABIs, however she does not want these completed.  Denies any claudication symptoms.  Does sit for long periods of time during the day.   Smoking status reviewed  Review of Systems Per HPI    Objective:  BP (!) 157/90   Pulse (!) 55   Wt 268 lb 3.2  oz (121.7 kg)   SpO2 97%   BMI 43.29 kg/m  Vitals and nursing note reviewed  General: NAD, pleasant Cardiac: RRR, normal heart sounds, no murmurs Respiratory: CTAB, normal effort Extremities: 1+ pitting edema to midshin bilaterally.  Palpable dorsalis pedis and posterior tibialis bilaterally with appropriate cap refill.  Normal gait, able to walk on heels and toes without concern.  No deformities, rash, bruising noted on bilateral feet.  No tenderness to palpation of bony regions of left foot.  Full ROM of ankle/knee joints.  5/5 lower extremity strength bilaterally.  Sensation to light touch intact throughout bilateral feet. Skin: warm and dry, no rashes noted Neuro: alert and oriented, no focal deficits Psych: normal affect  Assessment & Plan:   Foot pain, left Atypical, only present with cold wind hitting dorsal portion of foot. Physical exam unremarkable with normal gait and neurovascularly intact distally.  Etiology unclear, may be related to underlying osteoarthritis.  No additional signs/symptoms to suggest secondary to thyroid or peripheral neuropathy.  Reassuringly TSH WNL in 2019.  -Avoidance of triggers -Follow-up if symptoms worsening or any associated weakness, numbness, paresthesias  HTN (hypertension) Elevated in clinic today, 157/90 during visit, asymptomatic.  Questionable control at home, patient is unable to reliably state what has been when she checks it daily.  -Refilled home metoprolol and Lasix -Continue monitoring BP at home daily, keep journal of values to bring in  to follow-up with home cuff as well to compare -Follow-up in approximately 2 weeks for BP -Recommend BMP, lipid panel, screening A1c at that time (with concurrent elevated BMI)  Bilateral leg edema Chronic.  Through chart review, appears there has been numerous attempts for further work-up of her bilateral leg edema.  Declines further cardiac work-up including echocardiogram today.  Reassuringly has  been quite stable over the past several years and resolves with compression stockings, could consider venous insufficiency as well that worsens with sedentary lifestyle/compression as sitting down often. -Recommend restarting wearing compression stockings -Continue Lasix -Encouraged frequent physical activity   Follow-up in approximately 2 weeks for hypertension with primary care provider, or sooner if symptoms above worsening.  Leticia Penna DO Family Medicine Resident PGY-2

## 2018-11-20 NOTE — Assessment & Plan Note (Signed)
Atypical, only present with cold wind hitting dorsal portion of foot. Physical exam unremarkable with normal gait and neurovascularly intact distally.  Etiology unclear, may be related to underlying osteoarthritis.  No additional signs/symptoms to suggest secondary to thyroid or peripheral neuropathy.  Reassuringly TSH WNL in 2019.  -Avoidance of triggers -Follow-up if symptoms worsening or any associated weakness, numbness, paresthesias

## 2018-11-20 NOTE — Assessment & Plan Note (Addendum)
Elevated in clinic today, 157/90 during visit, asymptomatic.  Questionable control at home, patient is unable to reliably state what has been when she checks it daily.  -Refilled home metoprolol and Lasix -Continue monitoring BP at home daily, keep journal of values to bring in to follow-up with home cuff as well to compare -Follow-up in approximately 2 weeks for BP -Recommend BMP, lipid panel, screening A1c at that time (with concurrent elevated BMI)

## 2018-11-20 NOTE — Assessment & Plan Note (Addendum)
Chronic.  Through chart review, appears there has been numerous attempts for further work-up of her bilateral leg edema.  Declines further cardiac work-up including echocardiogram today.  Reassuringly has been quite stable over the past several years and resolves with compression stockings, could consider venous insufficiency as well that worsens with sedentary lifestyle/compression as sitting down often. -Recommend restarting wearing compression stockings -Continue Lasix -Encouraged frequent physical activity

## 2018-11-22 ENCOUNTER — Telehealth: Payer: Self-pay | Admitting: *Deleted

## 2018-11-22 NOTE — Telephone Encounter (Signed)
Pt calls back, she wants to know if Dr. Higinio Plan can send in some medication for the "pain and cold" in her feet.   She is unable to afford compression stockings right now.  They are $12 and she is working on saving the money to get them.  Christen Bame, CMA

## 2018-11-27 NOTE — Telephone Encounter (Signed)
She is calling to ask about the message from last week on getting pain meds for her feet.  Please call  Her today at [phone] to let her know about this, thanks.

## 2018-11-28 MED ORDER — MULTIVITAMINS PO CAPS: 1.0000 | ORAL_CAPSULE | Freq: Every day | ORAL | 11 refills | Status: DC

## 2018-11-28 NOTE — Telephone Encounter (Signed)
Spoke to patient about the cold & pain in her legs.  She reports that the pain is only with the cold in her legs.  It does not happen when she walks consistently.  She would like me to send pain medication to the pharmacy.  I informed her that I would not be able to send any strong pain medicine to the pharmacy as her symptoms are very vague. She reports that she is taking tylenol at this time and asks how much she can take per day. (She does not have a recent CMP - which would be helpful to get at the next visit-- pt does reports being weary of needles). I reviewed Dr. Bolivar Haw notes as well.  I will ask her about this more when she follows up.   Patient also asking about multivitamins.  She said we were supposed to start her multivitamins at her last visit.  I will order those today.  I asked how her blood pressure was doing.  She reports that it is good.  When I asked her the number, she reports that is good. She was adamant that it has been good. When asked her the last time she took her blood pressure, she says I checked it at the pharmacy 2 weeks ago.  She has a follow-up appointment with the clinic next week and we will recheck her blood pressure at that time.  PLAN - order MVI today  - CMP at next visit (LFT and Kidney eval).   Sarah Macdonald, M.D.  77:82 PM 11/28/2018

## 2018-11-30 NOTE — Telephone Encounter (Signed)
Called pt.  She is still unable to afford compression stockings.  She found some @ [facility] for $12.  Will provide 2 gift cards to [employer]. Christen Bame, CMA

## 2018-12-18 ENCOUNTER — Telehealth: Payer: Self-pay | Admitting: Family Medicine

## 2018-12-18 NOTE — Telephone Encounter (Signed)
Called patient and spoke to her about legs. I instructed her to make an appointment with [facility] for [place] Vas w/ and w/o abi as ordered by Dr. Owens Shark. Patient encouraged to get this imaging prior to appointment as it would be important for further work up. Pt agreeable   Wilber Oliphant, M.D.  10:15 AM 12/18/2018

## 2018-12-18 NOTE — Telephone Encounter (Signed)
She got her compression sock for her leg but it is still swelling and achy.  Please call her to discuss as she had to move her appointment to December with PCP.

## 2018-12-19 ENCOUNTER — Ambulatory Visit: Payer: Medicaid Other | Admitting: Family Medicine

## 2018-12-22 ENCOUNTER — Telehealth: Payer: Self-pay | Admitting: Family Medicine

## 2018-12-22 DIAGNOSIS — M79604 Pain in right leg: Secondary | ICD-10-CM

## 2018-12-22 NOTE — Telephone Encounter (Signed)
Spoke to pt about this, I called the [facility] at [facility] and they said they had scheduled it last year and then pt decided not to have it done.  Called to confirm that this was something she wanted to do and that it is not just an xray, which she thought that is what it was. She would like to have this at [place] and I called to see if the old order was still good and she said that they only do the VAS [place] ABI With TBI, not with/wo, she said if that is what you want then they can do it you would need to put in a new order and do it to [facility].  If you want the other I can see if the order is still good for hospital and if not you can put in a new order. PLease advise.Sarah Macdonald, CMA

## 2018-12-22 NOTE — Telephone Encounter (Signed)
Patient is supposed to be getting an x-ray of her leg but they say there is no order in for that for her.  She would like to get this done on 01/08/19 in the morning.  Please call her back at [phone].

## 2018-12-23 NOTE — Telephone Encounter (Signed)
Placed a new order for VAS [place] ABI With TBI. Thank you, April!

## 2018-12-25 ENCOUNTER — Other Ambulatory Visit: Payer: Self-pay | Admitting: Family Medicine

## 2018-12-25 NOTE — Telephone Encounter (Signed)
I cannot find an order that is just with TBI. I'll have to call over the [facility] [place] imaging to find the right order.   Wilber Oliphant, M.D.  12:25 PM 12/25/2018

## 2018-12-25 NOTE — Telephone Encounter (Signed)
Spoke with [facility] and they are reporting that this study can only be done in the vascular lab, one of which is located in the hospital. Spoke to Sarah Macdonald over the phone and we will continue with attempts to get her scheduled for [place]

## 2018-12-25 NOTE — Telephone Encounter (Signed)
Dr. Maudie Mercury it looks like the order you placed was exactly the same as the other one. Let me know when you change it. Romolo Sieling Zimmerman Rumple, CMA

## 2018-12-27 ENCOUNTER — Telehealth: Payer: Self-pay | Admitting: Family Medicine

## 2018-12-27 NOTE — Telephone Encounter (Signed)
Her leg has been swelling for about a week now and wants to know if there is anything she can take for this.  She wanted to speak to a nurse before she does anything else, please call her on [phone].

## 2018-12-27 NOTE — Telephone Encounter (Signed)
Contacted pt about her [place] and she has the [facility] so it would not cover the [place].  THe out of pocket cost would be $510.00.  Pt stated she could not do that at this time.  Instructed pt to discuss other options with Dr. Maudie Mercury at her visit on 01/11/2019.  She called about her legs swelling and wanted to know what she could do to help with this.  She said that she is wearing herr compression stockings, elevating her legs, and taking her fluid pill.  Told her I would let the doctor know to see if there is anything else that the doctor would like for her to do. Benjamin Merrihew Zimmerman Rumple, CMA

## 2018-12-29 ENCOUNTER — Encounter: Payer: Self-pay | Admitting: Family Medicine

## 2018-12-29 MED ORDER — DICLOFENAC SODIUM 1 % EX GEL: 2.0000 g | Freq: Four times a day (QID) | CUTANEOUS | 0 refills | Status: DC

## 2018-12-29 NOTE — Telephone Encounter (Signed)
Called patient --- she reports that she is having swelling (especially in top of her feet), stiffness and aches. Has not worsened over time. Goes away at night when she sleeps (with compression socks and elevation) but then comes back during the day. There is no pain. She can walk on it (but does have to stop and rest due to tiredness). Patient denies any redness or warmth. Patient cannot afford to get imaging due to insurance and out of pocket costs. Patient can come in early next week (already scheduled) to be further evaluated as she has not been in since seeing Dr. Higinio Plan over a month ago. Concern for osteoarthritis, trauma? Unsure without being able to see in person.  - provided voltaren gel to help with stiffness and aches for over the weekend as patient requests  - OTC creams can be used if she cannot afford voltaren  - patient told about after hours line should she become concerned   Wilber Oliphant, M.D.  10:32 AM 12/29/2018

## 2018-12-29 NOTE — Addendum Note (Signed)
Addended by: Wilber Oliphant on: 12/29/2018 10:32 AM   Modules accepted: Orders

## 2018-12-29 NOTE — Telephone Encounter (Signed)
Patient is wondering when the doctor might be free to talk to her about about the swelling in her legs. jw

## 2019-01-02 ENCOUNTER — Other Ambulatory Visit: Payer: Self-pay

## 2019-01-02 ENCOUNTER — Telehealth: Payer: Self-pay

## 2019-01-02 ENCOUNTER — Ambulatory Visit (INDEPENDENT_AMBULATORY_CARE_PROVIDER_SITE_OTHER): Payer: Self-pay | Admitting: Family Medicine

## 2019-01-02 VITALS — BP 130/70 | HR 60 | Ht 66.0 in | Wt 274.2 lb

## 2019-01-02 DIAGNOSIS — M79604 Pain in right leg: Secondary | ICD-10-CM

## 2019-01-02 DIAGNOSIS — R600 Localized edema: Secondary | ICD-10-CM

## 2019-01-02 DIAGNOSIS — M79605 Pain in left leg: Secondary | ICD-10-CM

## 2019-01-02 DIAGNOSIS — I10 Essential (primary) hypertension: Secondary | ICD-10-CM

## 2019-01-02 DIAGNOSIS — Z1211 Encounter for screening for malignant neoplasm of colon: Secondary | ICD-10-CM

## 2019-01-02 DIAGNOSIS — M7989 Other specified soft tissue disorders: Secondary | ICD-10-CM

## 2019-01-02 MED ORDER — MULTIVITAMINS PO CAPS: 1.0000 | ORAL_CAPSULE | Freq: Every day | ORAL | 11 refills | Status: DC

## 2019-01-02 MED ORDER — METOPROLOL TARTRATE 25 MG PO TABS: ORAL_TABLET | ORAL | 0 refills | Status: DC

## 2019-01-02 NOTE — Assessment & Plan Note (Addendum)
Well score 1.  Considered D-dimer and ultimately ordered LE Doppler to rule about possible DVT.  No history of DVTs.  Worsening LE edema may be due to her poorly controlled venous insufficiency or possibly lymphedema.  Low suspicion for cardiac cause.  We will follow-up electrolytes, thyroid, hemoglobin. -Follow-up LE Doppler -Continue compression hose, may remove at night if uncomfortable -Offered lymphedema clinic.  Patient declined -Discontinue Lasix.  This does not seem to be an effective treatment at this time, may be worth trialing off of Lasix as this can improve some cases. -Follow-up TSH, magnesium, calcium, CBC, BMP

## 2019-01-02 NOTE — Assessment & Plan Note (Signed)
-  Set up with FIT testing

## 2019-01-02 NOTE — Patient Instructions (Addendum)
Leg swelling - I think that the compression stockings you are using are the best thing for you right now.  I think that the lasix may not be helpful so lets see what happens if we stop the lasix for a month.  Please let me know if you have any shortness of breath or trouble sleeping flat.  We can refer you to a lymphedema clinic in the future if you decide that you are interested.  Blood clot - I think that it is very important to rule out a possible blood clot in your leg.  Let's make sure with an ultra sound today.  Colon cancer screening - The staff will set you up with everything you need to do this cancer screening.  I will let you know if there is anything abnormal with your blood work.  Please come back for a pap smear when you are able.

## 2019-01-02 NOTE — Progress Notes (Signed)
    Subjective:  Sarah Macdonald is a 53 y.o. female who presents to the [facility] today with a chief complaint of right-sided lower extremity swelling.   HPI:  Unilateral lower extremity edema Sarah Macdonald reports that she has a history of bilateral lower extremity edema for which she has been seen in clinic several times.  Most recently, she was encouraged to start wearing compression hose.  She has been wearing these compression hose during the day and at night and has been taking Lasix for lower extremity edema.  She is coming today because she has had worsened swelling only on the right side for 2 weeks.  She reports mild pain/cramping of her right lower extremity primarily at night.  The swelling on the right side is also associated with mild tenderness of the ankle.  She is never been diagnosed with cancer.  She has not experienced any periods of immobility.  She denies chest pain or shortness of breath with activity or orthopnea.  Screening for colon cancer She has never previously had a colonoscopy or colon cancer screening.  She is not interested in a colonoscopy at this time.  She is willing to move forward with FIT testing.  Chief Complaint noted Review of Symptoms - see HPI PMH - Smoking status noted.    Objective:  Physical Exam: BP 130/70   Pulse 60   Ht 5\' 6"  (1.676 m)   Wt 274 lb 4 oz (124.4 kg)   SpO2 96%   BMI 44.27 kg/m    Gen: NAD, resting comfortably Pulmonary: Breathing comfortably on room air Skin: Warm, dry Extremities: Wearing tight fitting pants and compression hose so that her legs could not be examined directly.  She had no tenderness to palpation of her calves or thighs bilaterally.  Circumference of right calf 21 cm, circumference of left calf 18.75 cm.  No cords palpated.  Skin could not be examined in her lower extremities.  Sensation to light touch intact bilaterally.  Difficult to palpate DP pulses.  No results found for this or any previous visit  (from the past 72 hour(s)).   Assessment/Plan:  Right leg swelling Well score 1.  Considered D-dimer and ultimately ordered LE Doppler to rule about possible DVT.  No history of DVTs.  Worsening LE edema may be due to her poorly controlled venous insufficiency or possibly lymphedema.  Low suspicion for cardiac cause.  We will follow-up electrolytes, thyroid, hemoglobin. -Follow-up LE Doppler -Continue compression hose, may remove at night if uncomfortable -Offered lymphedema clinic.  Patient declined -Discontinue Lasix.  This does not seem to be an effective treatment at this time, may be worth trialing off of Lasix as this can improve some cases. -Follow-up TSH, magnesium, calcium, CBC, BMP  Screening for colon cancer -Set up with FIT testing  Due for Pap smear -Encouraged to follow-up for routine screening for cervical cancer

## 2019-01-02 NOTE — Telephone Encounter (Signed)
Spoke with pt. Informed her that her appt at [facility] long for  11/25 was cancelled and rescheduled for 11/30  11am at [facility] . Pt understood. Salvatore Marvel, CMA

## 2019-01-03 ENCOUNTER — Ambulatory Visit (HOSPITAL_COMMUNITY): Payer: Self-pay

## 2019-01-03 ENCOUNTER — Telehealth: Payer: Self-pay | Admitting: *Deleted

## 2019-01-03 LAB — CBC
Hematocrit: 37 % (ref 34.0–46.6)
Hemoglobin: 12 g/dL (ref 11.1–15.9)
MCH: 29.3 pg (ref 26.6–33.0)
MCHC: 32.4 g/dL (ref 31.5–35.7)
MCV: 91 fL (ref 79–97)
Platelets: 350 10*3/uL (ref 150–450)
RBC: 4.09 x10E6/uL (ref 3.77–5.28)
RDW: 13.6 % (ref 11.7–15.4)
WBC: 6.3 10*3/uL (ref 3.4–10.8)

## 2019-01-03 LAB — BASIC METABOLIC PANEL
BUN/Creatinine Ratio: 13 (ref 9–23)
BUN: 10 mg/dL (ref 6–24)
CO2: 26 mmol/L (ref 20–29)
Calcium: 9.5 mg/dL (ref 8.7–10.2)
Chloride: 100 mmol/L (ref 96–106)
Creatinine, Ser: 0.8 mg/dL (ref 0.57–1.00)
GFR calc Af Amer: 97 mL/min/{1.73_m2} (ref >59)
GFR calc non Af Amer: 84 mL/min/{1.73_m2} (ref >59)
Glucose: 95 mg/dL (ref 65–99)
Potassium: 4.4 mmol/L (ref 3.5–5.2)
Sodium: 141 mmol/L (ref 134–144)

## 2019-01-03 LAB — TSH: TSH: 1.5 u[IU]/mL (ref 0.450–4.500)

## 2019-01-03 LAB — MAGNESIUM: Magnesium: 2.2 mg/dL (ref 1.6–2.3)

## 2019-01-03 NOTE — Telephone Encounter (Signed)
Pt calls back she wonders if she can have a medication for the stiffness, swelling and pain in her legs.   She states that her doppler is Monday and the compression stockings are not helping.  To Dr. [name] who saw her yesterday.   Christen Bame, CMA

## 2019-01-08 ENCOUNTER — Ambulatory Visit (HOSPITAL_COMMUNITY)
Admission: RE | Admit: 2019-01-08 | Discharge: 2019-01-08 | Disposition: A | Discharge status: Home / Self Care | Payer: Self-pay | Source: Ambulatory Visit | Attending: Family Medicine | Admitting: Family Medicine

## 2019-01-08 ENCOUNTER — Other Ambulatory Visit: Payer: Self-pay

## 2019-01-08 ENCOUNTER — Telehealth: Payer: Self-pay

## 2019-01-08 DIAGNOSIS — M7989 Other specified soft tissue disorders: Secondary | ICD-10-CM | POA: Insufficient documentation

## 2019-01-08 MED ORDER — APIXABAN 5 MG PO TABS: 5.0000 mg | ORAL_TABLET | Freq: Two times a day (BID) | ORAL | 0 refills | Status: DC

## 2019-01-08 NOTE — Telephone Encounter (Signed)
Spoke with [name] regarding her recent finding of right proximal DVT.  She feels well today.  She denies SOB, chest pain, tachycardia.    She was informed of her results and informed that the treatment for this is 3 months of anticoagulation.  She understood.  She denied history of GI bleeds.  She has never had a blood clot before.  She was instructed to begin apixaban today at 10 mg BID for one week followed by 5 mg BID for a total duration of 3 months.  She acknowledged understanding.  Apixaban order sent to preferred pharmacy.  Pt plans to follow up in clinic early next week.  Matilde Haymaker, MD

## 2019-01-08 NOTE — Telephone Encounter (Signed)
Sarah Macdonald, with [facility] vascular lab, calls nurse line to report positive DVT in RLE. [name] has been notified.

## 2019-01-08 NOTE — Progress Notes (Signed)
Right lower extremity venous duplex has been completed. Preliminary results can be found in CV Proc through chart review.  Results were given to Dr. [name].  01/08/19 11:31 AM Sarah Macdonald RVT

## 2019-01-09 ENCOUNTER — Telehealth: Payer: Self-pay | Admitting: *Deleted

## 2019-01-09 NOTE — Telephone Encounter (Signed)
Ms. Wineland was seen in clinic last week which time she is worked up for possible DVT.  On 11/30, a lower extremity Doppler revealed that she does in fact have a deep vein thrombus of her right lower extremity.  On 11/30, a prescription for Eliquis was called into the pharmacy.  On 12/1 the pharmacy called the family medicine clinic notifying them that Eliquis would not be covered by her insurance and would cost her over thousand dollars out-of-pocket.  She was called in the evening of 12/1.  She was informed that her insurance would not cover this medication.  She reported that she was not having any chest pain or shortness of breath at time of our phone call.  She stated multiple times that she feels very good does not want to go to the emergency room.  She is agreeable to being seen in clinic on 12/2 at which time she could potentially be started on samples available to clinic and pharmacy could be consulted for other appropriate options/medication assistance.  She was advised to be seen in the ED tonight if she developed chest pain or shortness of breath.  She acknowledged understanding.  She was agreeable to being seen in clinic on 12/2.  Matilde Haymaker, MD

## 2019-01-09 NOTE — Telephone Encounter (Signed)
Received message that Medication is not covered by insurance.  Pt has family planning (which she said is no longer valid) which does not cover any meds that are not birth control related.  Eliquis is going to cost her $1000 and she will need an alternative.  To PCP. Christen Bame, CMA

## 2019-01-10 ENCOUNTER — Ambulatory Visit (INDEPENDENT_AMBULATORY_CARE_PROVIDER_SITE_OTHER): Payer: Self-pay | Admitting: Student in an Organized Health Care Education/Training Program

## 2019-01-10 ENCOUNTER — Other Ambulatory Visit: Payer: Self-pay

## 2019-01-10 DIAGNOSIS — I82409 Acute embolism and thrombosis of unspecified deep veins of unspecified lower extremity: Secondary | ICD-10-CM | POA: Insufficient documentation

## 2019-01-10 DIAGNOSIS — I824Y2 Acute embolism and thrombosis of unspecified deep veins of left proximal lower extremity: Secondary | ICD-10-CM

## 2019-01-10 DIAGNOSIS — I10 Essential (primary) hypertension: Secondary | ICD-10-CM

## 2019-01-10 HISTORY — DX: Acute embolism and thrombosis of unspecified deep veins of unspecified lower extremity: I82.409

## 2019-01-10 MED ORDER — APIXABAN 5 MG PO TABS: ORAL_TABLET | ORAL | 0 refills | Status: DC

## 2019-01-10 NOTE — Telephone Encounter (Signed)
I spoke with Dr Valentina Lucks who can help with DOAC samples.  Hopefully enough for her full treatment course  Sarah Macdonald she has an appointment to see you at 245 in [facility] clinic.   Hopefully you can review Sarah Macdonald's notes and get the story.  I am covering the inpt service this afternoon so feel free to page me with questions  Thanks all!  [name]

## 2019-01-10 NOTE — Assessment & Plan Note (Signed)
BP elevated to 176/98, HR 84 today. Recommend rechecking at f/u and consider increasing BP med dose if still elevated.

## 2019-01-10 NOTE — Patient Instructions (Signed)
It was a pleasure to see you today!  To summarize our discussion for this visit:  Today we are going to provide you with a 1 month supply of the blood thinner called Eliquis.  We discussed how to take the medication but I will summarize it here as well.  The pills come in for 5 mg each.  Take 10 mg (2 tablets) twice a day for 7 days total.  Then, take 5 mg (1 tablet) twice a day for the remainder of the medication.  Before your medication runs out our office will contact you with assistance in continuing that medication to complete your treatment.  Please go to the emergency department if you experience chest pain or difficulty with breathing.  Some additional health maintenance measures we should update are: Health Maintenance Due  Topic Date Due  . TETANUS/TDAP  10/05/1983  . MAMMOGRAM  10/05/2014  . COLONOSCOPY  10/05/2014  . PAP SMEAR-Modifier  01/25/2017  .    Call the clinic at [phone] if your symptoms worsen or you have any concerns.   Thank you for allowing me to take part in your care,  Dr. Jamelle Rushing  Apixaban oral tablets What is this medicine? APIXABAN (a PIX a ban) is an anticoagulant (blood thinner). It is used to lower the chance of stroke in people with a medical condition called atrial fibrillation. It is also used to treat or prevent blood clots in the lungs or in the veins. This medicine may be used for other purposes; ask your health care provider or pharmacist if you have questions. COMMON BRAND NAME(S): Eliquis What should I tell my health care provider before I take this medicine? They need to know if you have any of these conditions:  antiphospholipid antibody syndrome  bleeding disorders  bleeding in the brain  blood in your stools (black or tarry stools) or if you have blood in your vomit  history of blood clots  history of stomach bleeding  kidney disease  liver disease  mechanical heart valve  an unusual or allergic  reaction to apixaban, other medicines, foods, dyes, or preservatives  pregnant or trying to get pregnant  breast-feeding How should I use this medicine? Take this medicine by mouth with a glass of water. Follow the directions on the prescription label. You can take it with or without food. If it upsets your stomach, take it with food. Take your medicine at regular intervals. Do not take it more often than directed. Do not stop taking except on your doctor's advice. Stopping this medicine may increase your risk of a blood clot. Be sure to refill your prescription before you run out of medicine. Talk to your pediatrician regarding the use of this medicine in children. Special care may be needed. Overdosage: If you think you have taken too much of this medicine contact a poison control center or emergency room at once. NOTE: This medicine is only for you. Do not share this medicine with others. What if I miss a dose? If you miss a dose, take it as soon as you can. If it is almost time for your next dose, take only that dose. Do not take double or extra doses. What may interact with this medicine? This medicine may interact with the following:  aspirin and aspirin-like medicines  certain medicines for fungal infections like ketoconazole and itraconazole  certain medicines for seizures like carbamazepine and phenytoin  certain medicines that treat or prevent blood clots like warfarin, enoxaparin, and  dalteparin  clarithromycin  NSAIDs, medicines for pain and inflammation, like ibuprofen or naproxen  rifampin  ritonavir  St. John's wort This list may not describe all possible interactions. Give your health care provider a list of all the medicines, herbs, non-prescription drugs, or dietary supplements you use. Also tell them if you smoke, drink alcohol, or use illegal drugs. Some items may interact with your medicine. What should I watch for while using this medicine? Visit your  healthcare professional for regular checks on your progress. You may need blood work done while you are taking this medicine. Your condition will be monitored carefully while you are receiving this medicine. It is important not to miss any appointments. Avoid sports and activities that might cause injury while you are using this medicine. Severe falls or injuries can cause unseen bleeding. Be careful when using sharp tools or knives. Consider using an Copy. Take special care brushing or flossing your teeth. Report any injuries, bruising, or red spots on the skin to your healthcare professional. If you are going to need surgery or other procedure, tell your healthcare professional that you are taking this medicine. Wear a medical ID bracelet or chain. Carry a card that describes your disease and details of your medicine and dosage times. What side effects may I notice from receiving this medicine? Side effects that you should report to your doctor or health care professional as soon as possible:  allergic reactions like skin rash, itching or hives, swelling of the face, lips, or tongue  signs and symptoms of bleeding such as bloody or black, tarry stools; red or dark-brown urine; spitting up blood or brown material that looks like coffee grounds; red spots on the skin; unusual bruising or bleeding from the eye, gums, or nose  signs and symptoms of a blood clot such as chest pain; shortness of breath; pain, swelling, or warmth in the leg  signs and symptoms of a stroke such as changes in vision; confusion; trouble speaking or understanding; severe headaches; sudden numbness or weakness of the face, arm or leg; trouble walking; dizziness; loss of coordination This list may not describe all possible side effects. Call your doctor for medical advice about side effects. You may report side effects to FDA at 1-800-FDA-1088. Where should I keep my medicine? Keep out of the reach of children. Store  at room temperature between 20 and 25 degrees C (68 and 77 degrees F). Throw away any unused medicine after the expiration date. NOTE: This sheet is a summary. It may not cover all possible information. If you have questions about this medicine, talk to your doctor, pharmacist, or health care provider.  2020 Elsevier/Gold Standard (2017-10-05 17:39:34)

## 2019-01-10 NOTE — Assessment & Plan Note (Addendum)
Lungs clear and without dyspnea or chest pain. Pulse ox 91% Prescribing eliquis samples and routing note to pharm team to further assist with affordability for continued treatment.  Counseled patient on symptoms that would indicate urgent presentation to ED.

## 2019-01-10 NOTE — Progress Notes (Signed)
   Subjective:    Patient ID: Sarah Macdonald, female    DOB: [birth year], 53 y.o.   MRN: [id number]  CC: DOAC initiation for Right LE DVT confirmed on doppler 11/30  HPI:  Continues to have right leg swelling and pain. Not worsening.  Denies chest pain or shortness of breath.  Was not able to afford prescription for eliquis. Patient here today to discuss use of the medication and to receive samples.  Patient has been laid off since July and has applied to several jobs but has no prospects of employment in the near future.   ROS: pertinent noted in the HPI   I have personally reviewed pertinent past medical history, surgical, family, and social history as appropriate. Objective:  BP (!) 176/98   Pulse 84   Wt 272 lb 12.8 oz (123.7 kg)   SpO2 91%   BMI 44.03 kg/m   Vitals and nursing note reviewed  General: NAD, pleasant, able to participate in exam Cardiac: RRR, S1 S2 present. normal heart sounds, no murmurs. Respiratory: CTAB, normal effort, No wheezes, rales or rhonchi Extremities: R LE acutely tender to light palpation. Unable to lift pant leg to knee 2/2 swelling. 3+ pitting edema, increased erythema and warmth compared to left. Skin: warm and dry, no rashes other than already noted Neuro: alert, no obvious focal deficits Psych: Normal affect and mood  Assessment & Plan:   DVT (deep venous thrombosis) (HCC) Lungs clear and without dyspnea or chest pain. Pulse ox 91% Prescribing eliquis samples and routing note to pharm team to further assist with affordability for continued treatment.  Counseled patient on symptoms that would indicate urgent presentation to ED.   HTN (hypertension) BP elevated to 176/98, HR 84 today. Recommend rechecking at f/u and consider increasing BP med dose if still elevated.    Meds ordered this encounter  Medications  . apixaban (ELIQUIS) 5 MG TABS tablet    Sig: Take 10mg  (two tablets) twice per day for 7 days total. Then, the next  day, start taking 5mg  (one tablet) twice per day until follow up.    Dispense:  70 tablet    Refill:  0    Order Specific Question:   Lot Number?    Answer:   [id number]    Order Specific Question:   Expiration Date?    Answer:   09/07/2020    Order Specific Question:   Quantity    Answer:   6    Comments:   70 tabs    I independently examined pertinent imaging in relation to problem.  Doristine Mango, [facility] PGY-2

## 2019-01-11 ENCOUNTER — Ambulatory Visit: Payer: Medicaid Other | Admitting: Family Medicine

## 2019-01-11 ENCOUNTER — Other Ambulatory Visit: Payer: Self-pay | Admitting: Family Medicine

## 2019-01-11 DIAGNOSIS — I10 Essential (primary) hypertension: Secondary | ICD-10-CM

## 2019-01-11 NOTE — Telephone Encounter (Signed)
Phone call follow-up of recent initiation of Eliquis.    Patient denied any symptoms of bleeding or bruising and verbalized understanding to call us with any concerning bruising or bleeding.  She verbalized understanding of dosing as 2 pills BID for 7 days followed by 1 pill BID.   I shared with her that we will attempt to provide adequate supply of medication for entire treatment course as a sample due to her lack of insurance.  I also shared that I plan to call her in late December to provide additional samples to complete treatment course.  I asked to contact us if she was in danger of running out.  I emphasized need to take this medication twice daily and need to complete total duration of treatment.   Patient verbalized understanding of plan to supply medication and need for adherence.

## 2019-01-19 ENCOUNTER — Encounter: Payer: Self-pay | Admitting: Family Medicine

## 2019-01-19 ENCOUNTER — Other Ambulatory Visit: Payer: Self-pay

## 2019-01-19 ENCOUNTER — Ambulatory Visit (INDEPENDENT_AMBULATORY_CARE_PROVIDER_SITE_OTHER): Payer: Self-pay | Admitting: Family Medicine

## 2019-01-19 VITALS — BP 144/84 | HR 61 | Ht 66.54 in | Wt 274.8 lb

## 2019-01-19 DIAGNOSIS — Z1211 Encounter for screening for malignant neoplasm of colon: Secondary | ICD-10-CM

## 2019-01-19 DIAGNOSIS — Z1231 Encounter for screening mammogram for malignant neoplasm of breast: Secondary | ICD-10-CM

## 2019-01-19 DIAGNOSIS — I824Y1 Acute embolism and thrombosis of unspecified deep veins of right proximal lower extremity: Secondary | ICD-10-CM

## 2019-01-19 NOTE — Progress Notes (Signed)
     Subjective  CC: DVT Follow up  QPR:[id number] Sarah Macdonald is Sarah 53 y.o. female who presents today with the following problems:  Right leg f/u  Still having some pain, but much improved. Is walking well on it. Pain subsides with tylenol. She is asking about how many tylenol she can take with the eliquis. No trouble breathing, coughing, SOB. No bleeding. Patient reports that she was taking eliquis appropriately and is now taking 5 mg BID. She has enough medication at this time. She feels as if something is "leaking" from the back of her leg.   Pertinent PM/FHx: HTN, obesity  Social Hx: Former smoker . Denies ETOH & drug use.  ROS: Pertinent ROS included in HPI. Objective  Physical Exam:  BP (!) 144/84   Pulse 61   Ht 5' 6.54" (1.69 m)   Wt 274 lb 12.8 oz (124.6 kg)   SpO2 98%   BMI 43.64 kg/m  General: delightful non-ill appearing female in NAD Chest: CTAB. Right leg: Appears swolled in edema to thighs. Edema concentrated on both posterior and anterior thigh with peau d'orange. Erythema throughout. 2+ DP bilaterally.  No weeping appreciated. No cellulitis or draining appreciated. MSK: normal gait.    Assessment & Plan    Problem List Items Addressed This Visit      Active Problems   DVT (deep venous thrombosis) (HCC)    Doing well. No complications. No SOB, CP and tolerating medication well. Patient to continue 5mg  BID for 3 months. No previous hx of VTE. Patient's risk factors include obesity, sedentary lifestyle, HTN. No oral contraceptives or known cancer. Thus far, history reveals unprovoked VTE. Will evaluate risk/benefit at the end of three months. - Follow up in 2 months        Other Visit Diagnoses    Screening mammogram, encounter for    -  Primary   Relevant Orders   MM Digital Screening   Encounter for screening colonoscopy       Relevant Orders   HM COLONOSCOPY (Completed)   Ambulatory referral to Gastroenterology     Wilber Oliphant, M.D.  1:07 PM  01/22/2019

## 2019-01-19 NOTE — Patient Instructions (Addendum)
Dear Sarah Macdonald,   It was good to see you! Thank you for taking your time to come in to be seen. Today, we discussed the following:   Blood Clot   Continue to take eliquis 5 mg twice daily  You will be taking this medication for at least 3 months.  I would like to determine whether or not this DVT was provoked or unprovoked, as this will change her overall treatment.  I have sent you referrals to get colonoscopy and mammography's for health maintenance.  Please contact the breast center to make an appointment.  Gastroenterology will call you to set up an appointment for a colonoscopy.  Please follow-up in about a month for Pap smear and check in.  You can take up to 4,000 mg of tylenol a day for pain.   High blood pressure: if your blood pressure top number is more than 140 and bottom number is more than 80 for a majority of your readings at home, please let us know. This might indicate that we need to put some blood pressure medication back on.    Weight loss: Exercise. Walk for 30 minutes a day, five days a week. Make very specific goals for food and nutrition.   Be well,   Genia Hotter, M.D   [facility] [phone]  *Sign up for MyChart for instant access to your health profile, labs, orders, upcoming appointments or to contact your provider with questions*  ===================================================================================  What Does SMART Mean? SMART is an acronym that you can use to guide your goal setting.  To make sure your goals are clear and reachable, each one should be:  Specific (simple, sensible, significant). Measurable (meaningful, motivating). Achievable (agreed, attainable). Relevant (reasonable, realistic and resourced, results-based). Time bound (time-based, time limited, time/cost limited, timely, time-sensitive).  How to Use SMART  1. Specific Your goal should be clear and specific, otherwise you won't be able  to focus your efforts or feel truly motivated to achieve it. When drafting your goal, try to answer the five "W" questions:  What do I want to accomplish? Why is this goal important? Who is involved? Where is it located? Which resources or limits are involved? 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 A specific goal could be, "I want to gain the skills and experience necessary to become head of marketing within my organization, so that I can build my career and lead a successful team."  2. Measurable It's important to have measurable goals, so that you can track your progress and stay motivated. Assessing progress helps you to stay focused, meet your deadlines, and feel the excitement of getting closer to achieving your goal.  A measurable goal should address questions such as:  How much? How many? How will I know when it is accomplished? Example You might measure your goal of acquiring the skills to become head of marketing by determining that you will have completed the necessary training courses and gained the relevant experience within five years' time.  3. Achievable Your goal also needs to be realistic and attainable to be successful. In other words, it should stretch your abilities but still remain possible. When you set an achievable goal, you may be able to identify previously overlooked opportunities or resources that can bring you closer to it.  An achievable goal will usually answer questions such as:  How can I accomplish this goal? How realistic is the  goal, based on other constraints, such as financial factors? Example You might need to ask yourself whether developing the skills required to become head of marketing is realistic, based on your existing experience and qualifications. For example, do you have the time to complete the required training effectively? Are the necessary resources available to you?  Can you afford to do it?  Tip: Beware setting goals that someone else has power over. For example, "Get that promotion!" depends on who else applies, and on the recruiter's decision. But "Get the experience and training that I need to be considered for that promotion" is entirely down to you.  4. Relevant This step is about ensuring that your goal matters to you, and that it also aligns with other relevant goals. We all need support and assistance in achieving our goals, but it's important to retain control over them. So, make sure that your plans drive everyone forward, but that you're still responsible for achieving your own goal.  A relevant goal can answer "yes" to these questions:  Does this seem worthwhile? Is this the right time? Does this match our other efforts/needs? Am I the right person to reach this goal? Is it applicable in the current socio-economic environment? Example You might want to gain the skills to become head of marketing within your organization, but is it the right time to undertake the required training, or work toward additional qualifications? Are you sure that you're the right person for the head of marketing role? Have you considered your spouse's goals? For example, if you want to start a family, would completing training in your free time make this more difficult?  5. Time-bound Every goal needs a target date, so that you have a deadline to focus on and something to work toward. This part of the SMART goal criteria helps to prevent everyday tasks from taking priority over your longer-term goals.  A time-bound goal will usually answer these questions:  When? What can I do six months from now? What can I do six weeks from now? What can I do today? Example Gaining the skills to become head of marketing may require additional training or experience , as we mentioned earlier. How long will it take you to acquire these skills? Do you need further training, so  that you're eligible for certain exams or qualifications? It's important to give yourself a realistic time frame for accomplishing the smaller goals that are necessary to achieving your final objective.

## 2019-01-22 NOTE — Assessment & Plan Note (Signed)
Doing well. No complications. No SOB, CP and tolerating medication well. Patient to continue 5mg  BID for 3 months. No previous hx of VTE. Patient's risk factors include obesity, sedentary lifestyle, HTN. No oral contraceptives or known cancer. Thus far, history reveals unprovoked VTE. Will evaluate risk/benefit at the end of three months. - Follow up in 2 months

## 2019-01-26 ENCOUNTER — Telehealth: Payer: Self-pay | Admitting: Family Medicine

## 2019-01-26 DIAGNOSIS — I10 Essential (primary) hypertension: Secondary | ICD-10-CM

## 2019-01-26 NOTE — Telephone Encounter (Signed)
Patient would like to know if she can get a cream for swelling and stiffness in her right leg, or if she will need to come in.  Please call her at [phone].

## 2019-01-28 MED ORDER — FUROSEMIDE 40 MG PO TABS: 40.0000 mg | ORAL_TABLET | ORAL | 1 refills | Status: DC

## 2019-01-28 NOTE — Telephone Encounter (Signed)
Patient left a message reporting that she has had continued swelling and stiffness in her right leg.  She was reports that she can walk on it just fine and her pain is well controlled on Tylenol, but is wondering if she could get something for the swelling and stiffness.  Previously told by Dr. [name] to discontinue Lasix prior to DVT diagnosis.  Will restart Lasix 40 mg daily.  Patient's most recent creatinine 0.8 on 1124/20.  Her blood pressures have been elevated at previous visits and I am not worried about any hypotension.  Wilber Oliphant, M.D.  3:50 PM 01/28/2019

## 2019-02-06 ENCOUNTER — Telehealth: Payer: Self-pay | Admitting: Pharmacist

## 2019-02-06 MED ORDER — APIXABAN 5 MG PO TABS: 5.0000 mg | ORAL_TABLET | Freq: Two times a day (BID) | ORAL | 0 refills | Status: DC

## 2019-02-06 NOTE — Telephone Encounter (Signed)
Noted and agree. 

## 2019-02-06 NOTE — Telephone Encounter (Signed)
Contacted patient to discuss supply of Eliquis (apixaban) therapy for DVT.  She states she has 5 pills remaining.  Reports adherence with medication - Twice daily.   Medication Samples have been provided for patient pick-up on 02/07/2019. Drug name: Eliquis (apixaban)       Strength: 5mg         Qty: 112 tablets (8 boxes of #14)  LOT: [id number] Exp.Date: 06/07/2020  Dosing instructions: 1 BID  The patient has been instructed regarding the correct time, dose, and frequency of taking this medication, including desired effects and most common side effects.   Janeann Forehand 11:23 AM 02/06/2019 I anticipate this supply should provide a total of 3 months of therapy.  I asked patient to contact our office if she needed additional supply of therapy.  She verbalized understanding of treatment plan.

## 2019-03-19 ENCOUNTER — Other Ambulatory Visit: Payer: Self-pay | Admitting: *Deleted

## 2019-03-19 DIAGNOSIS — I10 Essential (primary) hypertension: Secondary | ICD-10-CM

## 2019-03-19 MED ORDER — METOPROLOL TARTRATE 25 MG PO TABS: ORAL_TABLET | ORAL | 0 refills | Status: DC

## 2019-03-19 NOTE — Telephone Encounter (Signed)
Please ask patient to make virtual or in person visit with me for blood pressure. We talked about it at the last visit and I wanted her to measure her Bps at home because her pressures had been elevated at her last few visits. If she has not been writing them down, that is okay. Please ask her to measure them twice a day until she is able to schedule a visit. Will refill metoprolol x 1 month.

## 2019-03-23 NOTE — Telephone Encounter (Signed)
LVM for pt to call office to assist her getting an appointment scheduled per Dr. Selena Batten Mirtie Bastyr Lamonte Sakai, CMA

## 2019-04-17 ENCOUNTER — Telehealth: Payer: Self-pay

## 2019-04-17 NOTE — Telephone Encounter (Signed)
Patient calls nurse line with questions regarding continuance of eliquis. Patient was seen in December 2020 for DVT. Patient reports medication compliance on Eliquis (taking twice daily) and has four remaining pills.   Please advise if patient is to continue taking medication or if she needs office visit follow up.   To PCP and [facility]  Please advise  Veronda Prude, RN

## 2019-04-18 NOTE — Telephone Encounter (Signed)
Spoke with Dr. Selena Batten regarding patient. Per Dr. Selena Batten, patient has been working with Dr. Raymondo Band on treatment plan.   To [facility]  Please advise if patient is to continue current regimen or needs to scheduled follow up.  Veronda Prude, RN

## 2019-04-18 NOTE — Telephone Encounter (Signed)
Patient calls nurse line to follow up on medication management of eliquis. Text paged provider at this time for follow up.   Veronda Prude, RN

## 2019-04-18 NOTE — Telephone Encounter (Signed)
Patient informed that we would provide one month of samples and of the need to schedule follow up appointment with PCP. Patient scheduled for office visit with PCP this Friday 04/20/19 at 0950. Informed patient that she could pick up medication at her convenience at the front desk.   Veronda Prude, RN

## 2019-04-20 ENCOUNTER — Ambulatory Visit: Payer: Medicaid Other

## 2019-04-23 NOTE — Telephone Encounter (Signed)
Patient was a no-show to our follow-up appointment on Friday, March 12.  Please call and arrange for follow-up appointment to further discuss Eliquis and further management.

## 2019-05-05 NOTE — Telephone Encounter (Signed)
Called patient to discuss follow up appointment that she missed. Discussed pros and cons of discontinuing/continuing eliquis. We agreed to stop after this month's supply and to check in within a month of discontinuation. Provided return precautions.   Melene Plan, M.D.  11:32 AM 05/05/2019

## 2019-05-24 ENCOUNTER — Other Ambulatory Visit: Payer: Self-pay | Admitting: Family Medicine

## 2019-05-24 DIAGNOSIS — I10 Essential (primary) hypertension: Secondary | ICD-10-CM

## 2019-05-26 NOTE — Telephone Encounter (Signed)
Will refill for 1 month.  Patient needs follow-up appointment for chronic disease.  She missed her last appointment.

## 2019-06-12 ENCOUNTER — Other Ambulatory Visit: Payer: Self-pay | Admitting: Family Medicine

## 2019-06-12 DIAGNOSIS — I10 Essential (primary) hypertension: Secondary | ICD-10-CM

## 2019-06-18 ENCOUNTER — Other Ambulatory Visit: Payer: Self-pay

## 2019-06-18 ENCOUNTER — Ambulatory Visit (INDEPENDENT_AMBULATORY_CARE_PROVIDER_SITE_OTHER): Payer: Self-pay | Admitting: Family Medicine

## 2019-06-18 ENCOUNTER — Encounter: Payer: Self-pay | Admitting: Family Medicine

## 2019-06-18 VITALS — BP 154/104 | HR 71 | Ht 66.0 in | Wt 276.4 lb

## 2019-06-18 DIAGNOSIS — Z1231 Encounter for screening mammogram for malignant neoplasm of breast: Secondary | ICD-10-CM

## 2019-06-18 DIAGNOSIS — I824Y1 Acute embolism and thrombosis of unspecified deep veins of right proximal lower extremity: Secondary | ICD-10-CM

## 2019-06-18 DIAGNOSIS — R600 Localized edema: Secondary | ICD-10-CM

## 2019-06-18 DIAGNOSIS — I10 Essential (primary) hypertension: Secondary | ICD-10-CM

## 2019-06-18 DIAGNOSIS — Z6841 Body Mass Index (BMI) 40.0 and over, adult: Secondary | ICD-10-CM

## 2019-06-18 DIAGNOSIS — R6889 Other general symptoms and signs: Secondary | ICD-10-CM | POA: Insufficient documentation

## 2019-06-18 MED ORDER — AMLODIPINE BESYLATE 5 MG PO TABS: 5.0000 mg | ORAL_TABLET | Freq: Every day | ORAL | 2 refills | Status: DC

## 2019-06-18 MED ORDER — BLOOD PRESSURE MONITOR AUTOMAT DEVI: 1.0000 | Freq: Every day | 0 refills | Status: DC

## 2019-06-18 NOTE — Assessment & Plan Note (Signed)
Patient has been on various blood pressure medications in the past.  Currently only taking furosemide and metoprolol tartrate 12.5mg  once daily.  Per chart review, Sarah Macdonald has been placed on other medications in the past but they have also fallen off of her med list.   Patient asked to write down her blood pressures.    Blood pressure cuff sent to the pharmacy  start amlodipine and discontinue metoprolol.  Will start amlodipine at 5 mg and follow-up in 2 to 3 weeks.   Plans to increase amlodipine to 10 mg and add losartan if needed  Patient does not want to do labs today.  Sarah Macdonald is agreeable to coming back for labs later this week.  CMP, CBC, lipid profile, urine protein creatinine

## 2019-06-18 NOTE — Assessment & Plan Note (Addendum)
Patient reports that she has been trying to eat more healthy and is trying to exercise.  She reports that she walks around her neighborhood but this is limited because there are a lot of big dogs in her neighborhood.  She does walk around the house.  Recommended elevating heart rate for cardiovascular exercise.  Referral to Dr. Gerilyn Pilgrim for nutrition and physical activity education and goal setting.  Forgot to attach different types of exercise handouts as patient requested.  Printed and sent via mail.

## 2019-06-18 NOTE — Assessment & Plan Note (Signed)
Finished oral anticoagulation therapy x3 months.

## 2019-06-18 NOTE — Progress Notes (Signed)
SUBJECTIVE:  CHIEF COMPLAINT / HPI:   DVT Patient has finished medical therapy for DVT.  She has had no complications recently.  Hypertension  Currently taking furosemide 40 mg and metoprolol 25 mg.  Patient was started on Lasix for bilateral lower extremity edema in 2013.  She was also started on lisinopril 20 mg  in 2015, but was taken off of this medication. Was put on metoprolol 25 mg BID in 2014. No hx of heart failure.  No echocardiograms in the chart.  Now, she is on metoprolol 12.5 mg once daily.  Patient denies any changes in vision, chest pain.  Chronic lower extremity edema Patient has had lower extremity edema since 2014 per our chart.  She has never been worked up for heart failure or pulmonary hypertension.    PERTINENT  PMH / PSH: Hypertension, elevated BMI, chronic leg edema, history of DVT, osteoarthritis of left knee  Medication List was reviewed and updated.   OBJECTIVE:  BP (!) 154/104   Pulse 71   Ht 5\' 6"  (1.676 m)   Wt 276 lb 6.4 oz (125.4 kg)   SpO2 93%   BMI 44.61 kg/m   Well-appearing female, no acute distress. No JVD.  Regular rate and rhythm, no murmurs on exam.  Lungs are clear to auscultation bilaterally with no basilar crackles.  She has 1+ edema in her lower extremities to her knees.  Chronic venous stasis changes present in distal extremities.  1+ DP bilaterally.  Extremities warm and well perfused.  ASSESSMENT/PLAN:  HTN (hypertension) Patient has been on various blood pressure medications in the past.  Currently only taking furosemide and metoprolol tartrate 12.5mg  once daily.  Per chart review, she has been placed on other medications in the past but they have also fallen off of her med list.   Patient asked to write down her blood pressures.    Blood pressure cuff sent to the pharmacy  start amlodipine and discontinue metoprolol.  Will start amlodipine at 5 mg and follow-up in 2 to 3 weeks.   Plans to increase amlodipine to 10 mg and add  losartan if needed  Patient does not want to do labs today.  She is agreeable to coming back for labs later this week.  CMP, CBC, lipid profile, urine protein creatinine  BMI 40.0-44.9, adult [facility]) Patient reports that she has been trying to eat more healthy and is trying to exercise.  She reports that she walks around her neighborhood but this is limited because there are a lot of big dogs in her neighborhood.  She does walk around the house.  Recommended elevating heart rate for cardiovascular exercise.  Referral to [facility]. [facility] for nutrition and physical activity education and goal setting.  Forgot to attach different types of exercise handouts as patient requested.  Printed and sent via mail.  Bilateral leg edema Per chart review, it does not appear that patient has had chronic bilateral lower extremity edema worked up.  Order for echo that was never performed.  Will order echo today to rule out heart failure as cause of chronic bilateral lower edema.  She does not have any crackles on her lungs.  She has pitting 1+ to the knees.  Her legs have appearance of chronic venous stasis.  She denies any paroxysmal dyspnea, orthopnea.  Patient also reports that she has fluctuations in weight day-to-day of about 5 pounds.  She does take Lasix 40 mg for her lower extremity edema.  Other differential includes  pulmonary hypertension, lymphedema, OSA  DVT (deep venous thrombosis) (HCC) Finished oral anticoagulation therapy x3 months.   Health maintenance: Mammogram order placed.  Patient is also due for Pap smear.  She does not seem to want to do the screening exams.  Will likely have to remind patient about screening tests  Wilber Oliphant, MD [place]

## 2019-06-18 NOTE — Assessment & Plan Note (Addendum)
Per chart review, it does not appear that patient has had chronic bilateral lower extremity edema worked up.  Order for echo that was never performed.  Will order echo today to rule out heart failure as cause of chronic bilateral lower edema.  She does not have any crackles on her lungs.  She has pitting 1+ to the knees.  Her legs have appearance of chronic venous stasis.  She denies any paroxysmal dyspnea, orthopnea.  Patient also reports that she has fluctuations in weight day-to-day of about 5 pounds.  She does take Lasix 40 mg for her lower extremity edema.  Other differential includes pulmonary hypertension, lymphedema, OSA

## 2019-06-18 NOTE — Patient Instructions (Addendum)
Dear Sarah Macdonald,   It was good to see you! Thank you for taking your time to come in to be seen. Today, we discussed the following:   High blood pressure Please stop taking your metoprolol I am starting you on 1 medication today called amlodipine.  We will start with this medication and increase the dose.  If you are not at goal blood pressures, we will add a second medication. Please take your blood pressures daily either first thing in the morning or before you go to bed at night. Please write these down on a piece of paper so that we can monitor how your blood pressures are trending. If you experience any dizziness, lightheadedness, falls, falling out, please call us. Please come back to get your labs drawn at your earliest convenience. You can call the clinic to make a lab appointment. You will not have to be seen by a physician.   Weight loss I am so happy that you are keeping up with your health.  I have attached some exercises that you can do at home.  Also, please call Dr. Jenne Campus, our clinic dietitian who can set up a virtual visit with you and make goals for diet and exercise.  Leg swelling  I am sending you an order for echocardiogram.  This is to scan your heart to make sure that it is functioning properly.  Mammogram: You are due for breast cancer screening.  I have sent in the order.  You can call the breast center to set up your appointment at your convenience.  Please follow up in 2-3 weeks for Blood Pressure medication adjustment.  Please bring all of your medications to your next appointment.    You are due for the following Health Maintenance items. Please schedule an appointment to address these prior to leaving.  Health Maintenance Due  Topic Date Due  . COVID-19 Vaccine (1) Never done  . TETANUS/TDAP  Never done  . MAMMOGRAM  Never done  . PAP SMEAR-Modifier  01/25/2017    Be well,   Zettie Cooley, M.D   [place] [id number]  *Sign  up for MyChart for instant access to your health profile, labs, orders, upcoming appointments or to contact your provider with questions*  ===================================================================================  Exercises To Do While Sitting  Exercises that you do while sitting (chair exercises) can give you many of the same benefits as full exercise. Benefits include strengthening your heart, burning calories, and keeping muscles and joints healthy. Exercise can also improve your mood and help with depression and anxiety. You may benefit from chair exercises if you are unable to do standing exercises because of:  Diabetic foot pain.  Obesity.  Illness.  Arthritis.  Recovery from surgery or injury.  Breathing problems.  Balance problems.  Another type of disability. Before starting chair exercises, check with your health care provider or a physical therapist to find out how much exercise you can tolerate and which exercises are safe for you. If your health care provider approves:  Start out slowly and build up over time. Aim to work up to about 10-20 minutes for each exercise session.  Make exercise part of your daily routine.  Drink water when you exercise. Do not wait until you are thirsty. Drink every 10-15 minutes.  Stop exercising right away if you have pain, nausea, shortness of breath, or dizziness.  If you are exercising in a wheelchair, make sure to lock the wheels.  Ask your health  care provider whether you can do tai chi or yoga. Many positions in these mind-body exercises can be modified to do while seated. Warm-up Before starting other exercises: 1. Sit up as straight as you can. Have your knees bent at 90 degrees, which is the shape of the capital letter "L." Keep your feet flat on the floor. 2. Sit at the front edge of your chair, if you can. 3. Pull in (tighten) the muscles in your abdomen and stretch your spine and neck as straight as you can.  Hold this position for a few minutes. 4. Breathe in and out evenly. Try to concentrate on your breathing, and relax your mind. Stretching Exercise A: Arm stretch 1. Hold your arms out straight in front of your body. 2. Bend your hands at the wrist with your fingers pointing up, as if signaling someone to stop. Notice the slight tension in your forearms as you hold the position. 3. Keeping your arms out and your hands bent, rotate your hands outward as far as you can and hold this stretch. Aim to have your thumbs pointing up and your pinkie fingers pointing down. Slowly repeat arm stretches for one minute as tolerated. Exercise B: Leg stretch 1. If you can move your legs, try to "draw" letters on the floor with the toes of your foot. Write your name with one foot. 2. Write your name with the toes of your other foot. Slowly repeat the movements for one minute as tolerated. Exercise C: Reach for the sky 1. Reach your hands as far over your head as you can to stretch your spine. 2. Move your hands and arms as if you are climbing a rope. Slowly repeat the movements for one minute as tolerated. Range of motion exercises Exercise A: Shoulder roll 1. Let your arms hang loosely at your sides. 2. Lift just your shoulders up toward your ears, then let them relax back down. 3. When your shoulders feel loose, rotate your shoulders in backward and forward circles. Do shoulder rolls slowly for one minute as tolerated. Exercise B: March in place 1. As if you are marching, pump your arms and lift your legs up and down. Lift your knees as high as you can. ? If you are unable to lift your knees, just pump your arms and move your ankles and feet up and down. March in place for one minute as tolerated. Exercise C: Seated jumping jacks 1. Let your arms hang down straight. 2. Keeping your arms straight, lift them up over your head. Aim to point your fingers to the ceiling. 3. While you lift your arms,  straighten your legs and slide your heels along the floor to your sides, as wide as you can. 4. As you bring your arms back down to your sides, slide your legs back together. ? If you are unable to use your legs, just move your arms. Slowly repeat seated jumping jacks for one minute as tolerated. Strengthening exercises Exercise A: Shoulder squeeze 1. Hold your arms straight out from your body to your sides, with your elbows bent and your fists pointed at the ceiling. 2. Keeping your arms in the bent position, move them forward so your elbows and forearms meet in front of your face. 3. Open your arms back out as wide as you can with your elbows still bent, until you feel your shoulder blades squeezing together. Hold for 5 seconds. Slowly repeat the movements forward and backward for one minute as tolerated. Contact  a health care provider if you:  Had to stop exercising due to any of the following: ? Pain. ? Nausea. ? Shortness of breath. ? Dizziness. ? Fatigue.  Have significant pain or soreness after exercising. Get help right away if you have:  Chest pain.  Difficulty breathing. These symptoms may represent a serious problem that is an emergency. Do not wait to see if the symptoms will go away. Get medical help right away. Call your local emergency services (911 in the [place] Do not drive yourself to the hospital. This information is not intended to replace advice given to you by your health care provider. Make sure you discuss any questions you have with your health care provider. Document Revised: 05/18/2018 Document Reviewed: 12/08/2016 Elsevier Patient Education  [place].  Core Strength Exercises  Core exercises help to build strength in the muscles between your ribs and your hips (abdominal muscles). These muscles help to support your body and keep your spine stable. It is important to maintain strength in your core to prevent injury and pain. Some activities, such  as yoga and Pilates, can help to strengthen core muscles. You can also strengthen core muscles with exercises at home. It is important to talk to your health care provider before you start a new exercise routine. What are the benefits of core strength exercises? Core strength exercises can:  Reduce back pain.  Help to rebuild strength after a back or spine injury.  Help to prevent injury during physical activity, especially injuries to the back and knees. How to do core strength exercises Repeat these exercises 10-15 times, or until you are tired. Do exercises exactly as told by your health care provider and adjust them as directed. It is normal to feel mild stretching, pulling, tightness, or discomfort as you do these exercises. If you feel any pain while doing these exercises, stop. If your pain continues or gets worse when doing core exercises, contact your health care provider. You may want to use a padded yoga or exercise mat for strength exercises that are done on the floor. Bridging  1. Lie on your back on a firm surface with your knees bent and your feet flat on the floor. 2. Raise your hips so that your knees, hips, and shoulders form a straight line together. Keep your abdominal muscles tight. 3. Hold this position for 3-5 seconds. 4. Slowly lower your hips to the starting position. 5. Let your muscles relax completely between repetitions. Single-leg bridge 1. Lie on your back on a firm surface with your knees bent and your feet flat on the floor. 2. Raise your hips so that your knees, hips, and shoulders form a straight line together. Keep your abdominal muscles tight. 3. Lift one foot off the floor, then completely straighten that leg. 4. Hold this position for 3-5 seconds. 5. Put the straight leg back down in the bent position. 6. Slowly lower your hips to the starting position. 7. Repeat these steps using your other leg. Side bridge 1. Lie on your side with your knees bent.  Prop yourself up on the elbow that is near the floor. 2. Using your abdominal muscles and your elbow that is on the floor, raise your body off the floor. Raise your hip so that your shoulder, hip, and foot form a straight line together. 3. Hold this position for 10 seconds. Keep your head and neck raised and away from your shoulder (in their normal, neutral position). Keep your abdominal muscles  tight. 4. Slowly lower your hip to the starting position. 5. Repeat and try to hold this position longer, working your way up to 30 seconds. Abdominal crunch 1. Lie on your back on a firm surface. Bend your knees and keep your feet flat on the floor. 2. Cross your arms over your chest. 3. Without bending your neck, tip your chin slightly toward your chest. 4. Tighten your abdominal muscles as you lift your chest just high enough to lift your shoulder blades off of the floor. Do not hold your breath. You can do this with short lifts or long lifts. 5. Slowly return to the starting position. Bird dog 1. Get on your hands and knees, with your legs shoulder-width apart and your arms under your shoulders. Keep your back straight. 2. Tighten your abdominal muscles. 3. Raise one of your legs off the floor and straighten it. Try to keep it parallel to the floor. 4. Slowly lower your leg to the starting position. 5. Raise one of your arms off the floor and straighten it. Try to keep it parallel to the floor. 6. Slowly lower your arm to the starting position. 7. Repeat with the other arm and leg. If possible, try raising a leg and arm at the same time, on opposite sides of the body. For example, raise your left hand and your right leg. Plank 1. Lie on your belly. 2. Prop up your body onto your forearms and your feet, keeping your legs straight. Your body should make a straight line between your shoulders and feet. 3. Hold this position for 10 seconds while keeping your abdominal muscles tight. 4. Lower your body  to the starting position. 5. Repeat and try to hold this position longer, working your way up to 30 seconds. Cross-core strengthening 1. Stand with your feet shoulder-width apart. 2. Hold a ball out in front of you. Keep your arms straight. 3. Tighten your abdominal muscles and slowly rotate at your waist from side to side. Keep your feet flat. 4. Once you are comfortable, try repeating this exercise with a heavier ball. Top core strengthening 1. Stand about 18 inches (46 cm) in front of a wall, with your back to the wall. 2. Keep your feet flat and shoulder-width apart. 3. Tighten your abdominal muscles. 4. Bend your hips and knees. 5. Slowly reach between your legs to touch the wall behind you. 6. Slowly stand back up. 7. Raise your arms over your head and reach behind you. 8. Return to the starting position. General tips  Do not do any exercises that cause pain. If you have pain while exercising, talk to your health care provider.  Always stretch before and after doing these exercises. This can help prevent injury.  Maintain a healthy weight. Ask your health care provider what weight is healthy for you. Contact a health care provider if:  You have back pain that gets worse or does not go away.  You feel pain while doing core strength exercises. Get help right away if:  You have severe pain that does not get better with medicine. Summary  Core exercises help to build strength in the muscles between your ribs and your waist.  Core muscles help to support your body and keep your spine stable.  Some activities, such as yoga and Pilates, can help to strengthen core muscles.  Core strength exercises can help back pain and can prevent injury.  If you feel any pain while doing core strength exercises, stop. This  information is not intended to replace advice given to you by your health care provider. Make sure you discuss any questions you have with your health care  provider. Document Revised: 05/17/2018 Document Reviewed: 06/16/2016 Elsevier Patient Education  [place].

## 2019-06-20 ENCOUNTER — Other Ambulatory Visit: Payer: Self-pay

## 2019-06-20 ENCOUNTER — Ambulatory Visit (HOSPITAL_COMMUNITY): Payer: Self-pay

## 2019-06-20 DIAGNOSIS — I10 Essential (primary) hypertension: Secondary | ICD-10-CM

## 2019-06-20 MED ORDER — FUROSEMIDE 40 MG PO TABS: ORAL_TABLET | ORAL | 0 refills | Status: DC

## 2019-06-29 ENCOUNTER — Ambulatory Visit (HOSPITAL_COMMUNITY): Admission: RE | Admit: 2019-06-29 | Payer: Self-pay | Source: Ambulatory Visit

## 2019-07-12 ENCOUNTER — Ambulatory Visit: Payer: Medicaid Other

## 2019-08-10 ENCOUNTER — Other Ambulatory Visit: Payer: Self-pay | Admitting: Family Medicine

## 2019-08-10 DIAGNOSIS — I10 Essential (primary) hypertension: Secondary | ICD-10-CM

## 2019-08-10 NOTE — Telephone Encounter (Signed)
Patient needs appointment for future refills. HTN and leg edema f/u.

## 2019-08-16 ENCOUNTER — Ambulatory Visit: Payer: Medicaid Other | Admitting: Family Medicine

## 2019-08-22 ENCOUNTER — Ambulatory Visit: Payer: Medicaid Other | Admitting: Family Medicine

## 2019-09-03 ENCOUNTER — Telehealth: Payer: Self-pay | Admitting: Family Medicine

## 2019-09-03 DIAGNOSIS — I10 Essential (primary) hypertension: Secondary | ICD-10-CM

## 2019-09-03 NOTE — Telephone Encounter (Signed)
Patient calling regarding her BP medication. She says she was recently changed to a different medication and taking it in the afternoon, but she does not like this medication because it makes her dizzy. Patient would like to go back to her previous medication that she was taking in the mornings. Please advise.

## 2019-09-03 NOTE — Telephone Encounter (Signed)
Please ask patient to make an appointment to be seen. At her last appointment, her BP was not well controlled and the plan was to make multiple medication changes. She is also due to labs.

## 2019-09-04 NOTE — Progress Notes (Deleted)
   PROVIDER AGENDA:  PAP Smear Covid Vx  Mammogram  TDAP    Adult vaccines due  Topic Date Due  . TETANUS/TDAP  Never done     Patient Active Problem List   Diagnosis Date Noted  . BMI 40.0-44.9, adult ([place]) 06/18/2019  . Encounter for screening mammogram for malignant neoplasm of breast 06/18/2019  . Fluctuation of weight 06/18/2019  . Bilateral leg edema 11/20/2018  . Primary osteoarthritis of left knee 11/12/2016  . HTN (hypertension) 08/18/2012    Current Outpatient Medications  Medication Instructions  . acetaminophen (TYLENOL) 1,000 mg, Oral, Every 8 hours PRN  . amLODipine (NORVASC) 5 mg, Oral, Daily at bedtime  . Blood Pressure Monitoring (BLOOD PRESSURE MONITOR AUTOMAT) DEVI 1 kit, Does not apply, Daily, Please call the office if you have any issues obtaining this monitor.  . diclofenac Sodium (VOLTAREN) 2 g, Topical, 4 times daily  . furosemide (LASIX) 40 MG tablet TAKE 1 TABLET(40 MG) BY MOUTH EVERY MORNING  . metoprolol tartrate (LOPRESSOR) 25 MG tablet TAKE 1/2 TABLET(12.5 MG) BY MOUTH DAILY  . Multiple Vitamin (MULTIVITAMIN) capsule 1 capsule, Oral, Daily    SUBJECTIVE:  CHIEF COMPLAINT / HPI:   ***   PERTINENT  PMH / PSH: ***   OBJECTIVE:  There were no vitals taken for this visit.  ***  ASSESSMENT/PLAN:  No problem-specific Assessment & Plan notes found for this encounter.    Wilber Oliphant, MD [place]

## 2019-09-04 NOTE — Telephone Encounter (Signed)
Pt has appointment with Dr. Selena Batten on 09/05/2019. Dionis Autry Zimmerman Rumple, CMA

## 2019-09-05 ENCOUNTER — Ambulatory Visit: Payer: Medicaid Other | Admitting: Family Medicine

## 2019-09-07 NOTE — Telephone Encounter (Signed)
Patient returns call to nurse line regarding BP medications. Patient states that the has stopped taking BP medication as it has been making her dizzy. Patient denies HA, blurred vision, chest or arm pain. Patient states that she was not able to come into clinic on 7/28 as she had transportation issues.   Strict ED precautions given at this time.   Scheduled with Dr. Selena Batten for follow up next Friday.   To PCP   Veronda Prude, RN

## 2019-09-10 NOTE — Telephone Encounter (Signed)
Patient calls nurse line reporting she has not taken any BP meds in a "while." Patient reports her current BP meds were making her dizzy so she stopped. Patient requesting to be put back on Metoprolol. Patient reminded of her apt on 8/6 with PCP.

## 2019-09-10 NOTE — Addendum Note (Signed)
Addended by: Steva Colder on: 09/10/2019 09:03 AM   Modules accepted: Orders

## 2019-09-11 ENCOUNTER — Other Ambulatory Visit: Payer: Self-pay | Admitting: Family Medicine

## 2019-09-11 DIAGNOSIS — I10 Essential (primary) hypertension: Secondary | ICD-10-CM

## 2019-09-12 NOTE — Telephone Encounter (Signed)
Patient calls nurse line checking the status of refill.

## 2019-09-14 ENCOUNTER — Other Ambulatory Visit: Payer: Self-pay

## 2019-09-14 ENCOUNTER — Ambulatory Visit (INDEPENDENT_AMBULATORY_CARE_PROVIDER_SITE_OTHER): Payer: Self-pay | Admitting: Family Medicine

## 2019-09-14 ENCOUNTER — Encounter: Payer: Self-pay | Admitting: Family Medicine

## 2019-09-14 VITALS — BP 156/100 | HR 71 | Wt 272.2 lb

## 2019-09-14 DIAGNOSIS — I10 Essential (primary) hypertension: Secondary | ICD-10-CM

## 2019-09-14 DIAGNOSIS — Z1159 Encounter for screening for other viral diseases: Secondary | ICD-10-CM

## 2019-09-14 NOTE — Progress Notes (Signed)
    SUBJECTIVE:   CHIEF COMPLAINT / HPI:   Hypertension At last visit, started on 5 mg amlodipine and was supposed to follow up in 2-3 weeks. She is taking lasix 40 mg daily. She was supposed to have labs obtained after last visit but did not follow up. BP today is 156/100. I sent her a BP cuff the last time that she was seen.    OBJECTIVE:   BP (!) 156/100   Pulse 71   Wt 272 lb 3.2 oz (123.5 kg)   SpO2 95%   BMI 43.93 kg/m    ASSESSMENT/PLAN:   HTN (hypertension) Patient came in for appointment and sent to lab prior to close. She left the office after her labs instead of returning to the exam room. Will review labs when they return and call patient to discuss results.   Melene Plan, MD [facility]

## 2019-09-15 LAB — CBC
Hematocrit: 35.8 % (ref 34.0–46.6)
Hemoglobin: 11.9 g/dL (ref 11.1–15.9)
MCH: 29.9 pg (ref 26.6–33.0)
MCHC: 33.2 g/dL (ref 31.5–35.7)
MCV: 90 fL (ref 79–97)
Platelets: 289 10*3/uL (ref 150–450)
RBC: 3.98 x10E6/uL (ref 3.77–5.28)
RDW: 14.4 % (ref 11.7–15.4)
WBC: 5.6 10*3/uL (ref 3.4–10.8)

## 2019-09-15 LAB — BASIC METABOLIC PANEL
BUN/Creatinine Ratio: 16 (ref 9–23)
BUN: 14 mg/dL (ref 6–24)
CO2: 25 mmol/L (ref 20–29)
Calcium: 9.5 mg/dL (ref 8.7–10.2)
Chloride: 103 mmol/L (ref 96–106)
Creatinine, Ser: 0.89 mg/dL (ref 0.57–1.00)
GFR calc Af Amer: 85 mL/min/{1.73_m2} (ref >59)
GFR calc non Af Amer: 74 mL/min/{1.73_m2} (ref >59)
Glucose: 101 mg/dL — ABNORMAL HIGH (ref 65–99)
Potassium: 4.3 mmol/L (ref 3.5–5.2)
Sodium: 140 mmol/L (ref 134–144)

## 2019-09-15 LAB — LIPID PANEL
Chol/HDL Ratio: 3.1 ratio (ref 0.0–4.4)
Cholesterol, Total: 185 mg/dL (ref 100–199)
HDL: 60 mg/dL (ref >39)
LDL Chol Calc (NIH): 117 mg/dL — ABNORMAL HIGH (ref 0–99)
Triglycerides: 42 mg/dL (ref 0–149)
VLDL Cholesterol Cal: 8 mg/dL (ref 5–40)

## 2019-09-15 LAB — HEPATITIS C ANTIBODY: Hep C Virus Ab: <0.1 s/co ratio (ref 0.0–0.9)

## 2019-09-18 ENCOUNTER — Encounter: Payer: Self-pay | Admitting: Family Medicine

## 2019-09-18 NOTE — Assessment & Plan Note (Signed)
Patient came in for appointment and sent to lab prior to close. She left the office after her labs instead of returning to the exam room. Will review labs when they return and call patient to discuss results.

## 2019-09-19 ENCOUNTER — Telehealth: Payer: Self-pay

## 2019-09-19 NOTE — Telephone Encounter (Signed)
She can discontinue to amlodipine.  Poonam, can you please start her on an lisinopril at her next visit with and get her to come back for a lab appointment a few days after starting lisinopril? Her Bps have been pretty high, so she will need close follow up with me to titrate her medications. Thank you!

## 2019-09-19 NOTE — Telephone Encounter (Signed)
Patient calls nurse line regarding BP medication management. Patient states that the amlodipine has been making her dizzy and she would like to go back to taking metoprolol.   Patient has scheduled follow up on Friday with [facility] (Dr. Allena Katz). Patient requesting refill on metoprolol.   Patient reports that she has not yet received BP machine. Denies headache, chest pain, visual changes. Denies dizziness at this time, only when she takes the amlodipine.   Strict ED precautions given.   Veronda Prude, RN

## 2019-09-20 ENCOUNTER — Ambulatory Visit: Payer: Medicaid Other

## 2019-09-20 NOTE — Telephone Encounter (Signed)
Yes I will start her on Lisinopril tomorrow and have her come back in for BMP next week. Thanks for letting me know

## 2019-09-21 ENCOUNTER — Other Ambulatory Visit: Payer: Self-pay

## 2019-09-21 ENCOUNTER — Ambulatory Visit: Payer: Medicaid Other

## 2019-09-21 ENCOUNTER — Ambulatory Visit (INDEPENDENT_AMBULATORY_CARE_PROVIDER_SITE_OTHER): Payer: Self-pay | Admitting: Family Medicine

## 2019-09-21 VITALS — BP 148/74 | HR 81 | Wt 266.0 lb

## 2019-09-21 DIAGNOSIS — I10 Essential (primary) hypertension: Secondary | ICD-10-CM

## 2019-09-21 DIAGNOSIS — M1712 Unilateral primary osteoarthritis, left knee: Secondary | ICD-10-CM

## 2019-09-21 DIAGNOSIS — M170 Bilateral primary osteoarthritis of knee: Secondary | ICD-10-CM

## 2019-09-21 MED ORDER — AMLODIPINE BESYLATE 5 MG PO TABS: 5.0000 mg | ORAL_TABLET | Freq: Every day | ORAL | 2 refills | Status: DC

## 2019-09-21 MED ORDER — DICLOFENAC SODIUM 1 % EX GEL: 2.0000 g | Freq: Four times a day (QID) | CUTANEOUS | 0 refills | Status: DC

## 2019-09-21 MED ORDER — ACETAMINOPHEN 500 MG PO TABS: 1000.0000 mg | ORAL_TABLET | Freq: Three times a day (TID) | ORAL | 3 refills | Status: DC | PRN

## 2019-09-21 NOTE — Patient Instructions (Addendum)
It was great seeing you today!   I'd like to see you back in 1-2 weeks but if you need to be seen earlier than that for any new issues we're happy to fit you in, just give [place] a call!  - Start taking amlodipine in the morning.  - Try to monitor your blood pressures at home. I put an order in for your to get a new blood pressure cuff.  Hopefully you can better monitor your blood pressure with this device.  - For you knee pain: take 2 -500 mg tablets every 8 hours for pain (three times a day).  Use voltaren gel to help with knee pain between these medications.  Try to keep your legs propped up to prevent more swelling in your legs.        If you have questions or concerns please do not hesitate to call at [phone].  Dr. Katherina Right [facility]

## 2019-09-21 NOTE — Progress Notes (Signed)
   SUBJECTIVE:   CHIEF COMPLAINT / HPI:   Chief Complaint  Patient presents with  . leg stiffness    Leg stiffness LUREE PALLA is a 54 y.o. female with hx of OA and DVT here for bilateral leg stiffness for the past.  Patient states that she gets up and eats and takes Tylenol and an hour later stiffness resolves.  Stiffness has been ongoing for" a while now".  Pain and stiffness is disturbing her sleep as she reports that it sometimes wakes her up out of her sleep.  Pain radiates from the knees up to the thigh and down to the feet.  States that she takes Tylenol at 1000 mg in the morning and 500 at night.  She does not use Voltaren gel.  Takes Lasix for swelling and stiffness.   PERTINENT  PMH / PSH: reviewed and updated as appropriate   OBJECTIVE:   BP (!) 148/74   Pulse 81   Wt 266 lb (120.7 kg)   SpO2 93%   BMI 42.93 kg/m    GEN: Well developed, obese female, in no acute distress  CV: regular rate and rhythm, no murmurs appreciated  RESP: no increased work of breathing, clear to ascultation bilaterally  ABD: Obese abdomen MSK: 1+ lower extremity edema,  Knees: -Inspection: no deformity, no discoloration -Palpation:  No joint line tenderness, medial tenderness to palpation bilaterally -ROM: Knee range of motion limited due to pain -Patient able to extend and flex his knee on his own, strength testing 5/5 -Special Tests: Varus Stress: Negative; Valgus Stress: Negative; anterior drawer: Negative; Posterior drawer: Negative; McMurray: Negative;  -Limb neurovascularly intact, no instability noted SKIN: warm, dry, no wounds NEURO: Normal tone, moves all extremities appropriately    ASSESSMENT/PLAN:   Bilateral primary osteoarthritis of knee Reminded patient to use Tylenol 1000 mg 3 times a day.  Use Voltaren gel for intermittent relief.  Ice knee as needed.  Patient agreeable to plan  HTN (hypertension) Patient had not been taking amlodipine as she states that it  was supposed to be taking at bedtime.  She does not want to take medication at bedtime as she is likely to forget.  Denies dizziness or any other adverse effects to amlodipine.  Discussed need for better blood pressure control other than metoprolol.  Asked patient to restart amlodipine 5 mg daily.  Will defer starting ACEi/ARB at this time.  Patient agreeable with plan.     Patient will return return for Pap smear, declined today secondary to pain.   Katha Cabal, DO PGY-2, [facility] 09/21/2019

## 2019-09-24 ENCOUNTER — Telehealth: Payer: Self-pay | Admitting: Family Medicine

## 2019-09-24 NOTE — Telephone Encounter (Signed)
Called patient and informed that note was ready to be picked up. Placed at front desk.   Veronda Prude, RN

## 2019-09-24 NOTE — Telephone Encounter (Signed)
Patient stopped in needs doctors note for her visit on 09/21/2019.  She will pick it up.  [phone]

## 2019-10-01 NOTE — Assessment & Plan Note (Signed)
Reminded patient to use Tylenol 1000 mg 3 times a day.  Use Voltaren gel for intermittent relief.  Ice knee as needed.  Patient agreeable to plan

## 2019-10-01 NOTE — Assessment & Plan Note (Addendum)
Patient had not been taking amlodipine as she states that it was supposed to be taking at bedtime.  She does not want to take medication at bedtime as she is likely to forget.  Denies dizziness or any other adverse effects to amlodipine.  Discussed need for better blood pressure control other than metoprolol.  Asked patient to restart amlodipine 5 mg daily.  Will defer starting ACEi/ARB at this time.  Patient agreeable with plan.

## 2019-10-17 ENCOUNTER — Other Ambulatory Visit: Payer: Self-pay | Admitting: Family Medicine

## 2019-10-17 DIAGNOSIS — I10 Essential (primary) hypertension: Secondary | ICD-10-CM

## 2019-11-21 ENCOUNTER — Other Ambulatory Visit: Payer: Self-pay | Admitting: Family Medicine

## 2019-11-21 ENCOUNTER — Ambulatory Visit: Payer: Medicaid Other

## 2019-11-21 DIAGNOSIS — I10 Essential (primary) hypertension: Secondary | ICD-10-CM

## 2019-11-27 ENCOUNTER — Ambulatory Visit (INDEPENDENT_AMBULATORY_CARE_PROVIDER_SITE_OTHER): Payer: Self-pay | Admitting: Family Medicine

## 2019-11-27 ENCOUNTER — Other Ambulatory Visit: Payer: Self-pay

## 2019-11-27 VITALS — BP 126/75 | HR 70 | Ht 66.0 in | Wt 258.2 lb

## 2019-11-27 DIAGNOSIS — I10 Essential (primary) hypertension: Secondary | ICD-10-CM

## 2019-11-27 DIAGNOSIS — L03115 Cellulitis of right lower limb: Secondary | ICD-10-CM | POA: Insufficient documentation

## 2019-11-27 DIAGNOSIS — R21 Rash and other nonspecific skin eruption: Secondary | ICD-10-CM

## 2019-11-27 MED ORDER — SULFAMETHOXAZOLE-TRIMETHOPRIM 800-160 MG PO TABS: 1.0000 | ORAL_TABLET | Freq: Two times a day (BID) | ORAL | 0 refills | Status: AC

## 2019-11-27 NOTE — Assessment & Plan Note (Addendum)
Acute skin changes with recent trauma resulting in broken skin and pus production. Area surrounding wound continues to be fluctuant but with minimal clear fluid drainage. Suspected MSSA with concern for MRSA. Will culture and treat empirically. No other systemic symptoms at this time. Will demarcate.  -Start Bactrim BID x10 days -F/u wound culture collected  -Follow up if signs of systemic symptoms or redness continues beyond demarcation with abx tx.

## 2019-11-27 NOTE — Patient Instructions (Signed)
It was wonderful to see you today.  Please bring ALL of your medications with you to every visit.   Today we talked about:  Your leg rash that appear to be a skin infection. We cultured the sight and will notify you of the results. I have prescribed bactrim which is an antibiotic that you will take for a total of 10 days.   If the redness starts to pass the line that is drawn please give [place] a call.   Please call the clinic at [phone] if your symptoms worsen (fever, nausea, vomiting) or you have any concerns. It was our pleasure to serve you.  Dr. Salvadore Dom

## 2019-11-27 NOTE — Assessment & Plan Note (Signed)
BP at goal. Continue amlodipine.  

## 2019-11-27 NOTE — Progress Notes (Addendum)
    SUBJECTIVE:   CHIEF COMPLAINT / HPI:   Sarah Macdonald is a 54 yo F who presents to access to care for the below reason.   Leg Rash Began x1 month on right lower leg.  Recent trauma to this leg 2 weeks ago.  Patient states she had her leg on a chair at home.  The site drained pus for 2 days.  She did not attempt to massage extra pus out. Used hydrogen peroxide and neosporin on the wound. She denies pain or pruritus at the site.  She also denies any systemic symptoms such as fever, nausea or vomiting.  Declined flu shot today.  Hypertension - Medications: Amlodipine 5 mg - Compliance: Yes - Denies any SOB, CP, vision changes, LE edema, medication SEs, or symptoms of hypotension  PERTINENT  PMH / PSH: Bilateral leg edema (on Lasix 40 mg daily)  OBJECTIVE:   BP 126/75   Pulse 70   Ht 5\' 6"  (1.676 m)   Wt 258 lb 3.2 oz (117.1 kg)   SpO2 90%   BMI 41.67 kg/m   General: Appears well, no acute distress. Age appropriate. Extremities/Skin: Right lower extremities with edema, erythema, and warmth at lateral side. Medial side with lower limb wound with healing scab with minimal fluid drainage. Venous stasis skin changes present on both LE bilaterally. Right LE grossly unremarkable.          ASSESSMENT/PLAN:   Cellulitis of right lower extremity Acute skin changes with recent trauma resulting in broken skin and pus production. Area surrounding wound continues to be fluctuant but with minimal clear fluid drainage. Suspected MSSA with concern for MRSA. Will culture and treat empirically. No other systemic symptoms at this time. Will demarcate.  -Start Bactrim BID x10 days -F/u wound culture collected  -Follow up if signs of systemic symptoms or redness continues beyond demarcation with abx tx.   HTN (hypertension) BP at goal -Continue amlodipine   , DO [facility]

## 2019-12-03 LAB — WOUND CULTURE

## 2019-12-05 ENCOUNTER — Encounter: Payer: Self-pay | Admitting: Family Medicine

## 2019-12-11 ENCOUNTER — Telehealth: Payer: Self-pay

## 2019-12-11 NOTE — Telephone Encounter (Signed)
Patient calls nurse line with questions regarding recent antibiotic therapy. Patient is inquiring if she should still be taking bactrim twice daily. Advised patient that provider wrote for 10 day treatment plan. Patient denies redness, swelling, warmth, or draining of legs.   Provided patient with return precautions. Per note, it does not seem that patient would require additional refill of medication.   Please advise of any additional recommendations.   To PCP   Veronda Prude, RN

## 2019-12-11 NOTE — Telephone Encounter (Signed)
I agree. If she has resolution of the skin changes and she has finished all ten days of medication, she does not need another refill.

## 2019-12-15 ENCOUNTER — Other Ambulatory Visit: Payer: Self-pay | Admitting: Family Medicine

## 2019-12-15 DIAGNOSIS — I10 Essential (primary) hypertension: Secondary | ICD-10-CM

## 2019-12-19 ENCOUNTER — Other Ambulatory Visit: Payer: Self-pay | Admitting: Family Medicine

## 2019-12-19 DIAGNOSIS — I10 Essential (primary) hypertension: Secondary | ICD-10-CM

## 2019-12-20 NOTE — Telephone Encounter (Signed)
Patient needs follow up appt to address hypertension and lower extremity swelling. Please call patient to schedule with me.   Melene Plan, M.D.  2:07 PM 12/20/2019

## 2020-01-15 ENCOUNTER — Ambulatory Visit: Payer: Medicaid Other | Admitting: Family Medicine

## 2020-01-15 ENCOUNTER — Other Ambulatory Visit: Payer: Self-pay | Admitting: Family Medicine

## 2020-01-15 DIAGNOSIS — I10 Essential (primary) hypertension: Secondary | ICD-10-CM

## 2020-01-15 NOTE — Progress Notes (Deleted)
    SUBJECTIVE:   CHIEF COMPLAINT / HPI:   Leg cellulitis:  PERTINENT  PMH / PSH: ***  OBJECTIVE:   There were no vitals taken for this visit.  ***  ASSESSMENT/PLAN:   No problem-specific Assessment & Plan notes found for this encounter.     Shirlean Mylar, MD [facility]

## 2020-01-15 NOTE — Patient Instructions (Incomplete)
It was a pleasure to see you today!  1.    Be Well,  Dr. Palestine Mosco  

## 2020-02-17 ENCOUNTER — Other Ambulatory Visit: Payer: Self-pay | Admitting: Family Medicine

## 2020-02-17 DIAGNOSIS — I10 Essential (primary) hypertension: Secondary | ICD-10-CM

## 2020-02-18 NOTE — Telephone Encounter (Signed)
No further refills of either medication until patient follows up for blood pressure.

## 2020-02-26 ENCOUNTER — Ambulatory Visit: Payer: Medicaid Other | Admitting: Family Medicine

## 2020-03-04 ENCOUNTER — Other Ambulatory Visit: Payer: Self-pay

## 2020-03-04 ENCOUNTER — Ambulatory Visit (INDEPENDENT_AMBULATORY_CARE_PROVIDER_SITE_OTHER): Payer: Self-pay | Admitting: Family Medicine

## 2020-03-04 ENCOUNTER — Encounter: Payer: Self-pay | Admitting: Family Medicine

## 2020-03-04 VITALS — BP 124/72 | HR 82 | Ht 66.0 in | Wt 258.2 lb

## 2020-03-04 DIAGNOSIS — I10 Essential (primary) hypertension: Secondary | ICD-10-CM

## 2020-03-04 DIAGNOSIS — Z6841 Body Mass Index (BMI) 40.0 and over, adult: Secondary | ICD-10-CM

## 2020-03-04 DIAGNOSIS — M170 Bilateral primary osteoarthritis of knee: Secondary | ICD-10-CM

## 2020-03-04 DIAGNOSIS — E7800 Pure hypercholesterolemia, unspecified: Secondary | ICD-10-CM

## 2020-03-04 DIAGNOSIS — I83019 Varicose veins of right lower extremity with ulcer of unspecified site: Secondary | ICD-10-CM

## 2020-03-04 DIAGNOSIS — M1712 Unilateral primary osteoarthritis, left knee: Secondary | ICD-10-CM

## 2020-03-04 DIAGNOSIS — L97919 Non-pressure chronic ulcer of unspecified part of right lower leg with unspecified severity: Secondary | ICD-10-CM

## 2020-03-04 LAB — POCT GLYCOSYLATED HEMOGLOBIN (HGB A1C): Hemoglobin A1C: 6.6 % — AB (ref 4.0–5.6)

## 2020-03-04 MED ORDER — DICLOFENAC SODIUM 1 % EX GEL: 2.0000 g | Freq: Four times a day (QID) | CUTANEOUS | 0 refills | Status: DC

## 2020-03-04 NOTE — Patient Instructions (Addendum)
Please come back for weekly nurse visits to get unna boots changed out. Please make follow up appointment in one month.   Venous Ulcer A venous ulcer is a shallow sore on your lower leg. Venous ulcer is the most common type of lower leg ulcer. You may have venous ulcers on one leg or on both legs. This condition most often develops around your ankles. This type of ulcer may last for a long time (chronic ulcer) or it may return often (recurrent ulcer). What are the causes? This condition is caused by poor blood flow in your legs. The poor flow causes blood to pool in your legs. This can break the skin, causing an ulcer. What increases the risk? You are more likely to develop this condition if:  You are 54 years of age or older.  You are female.  You are overweight.  You are not active.  You have had a leg ulcer in the past.  You have varicose veins.  You have clots in your lower leg veins (deep vein thrombosis).  You have inflammation of your leg veins (phlebitis).  You have recently been pregnant.  You smoke. What are the signs or symptoms? The main symptom of this condition is an open sore near your ankle. Other symptoms may include:  Swelling.  Thick skin.  Fluid coming from the ulcer.  Bleeding.  Itching.  Pain and swelling. This gets worse when you stand up and feels better when you raise your leg.  Blotchy skin.  Dark skin.   How is this treated? This condition may be treated by:  Keeping your leg raised (elevated).  Wearing a type of bandage or stocking to keep pressure (compression) on the veins of your leg.  Taking medicines, including antibiotic medicines.  Cleaning your ulcer and removing any dead tissue from the wound.  Using bandages and wraps that have medicines in them to cover your ulcer.  Closing the wound using a piece of skin taken from another area of your body (graft). Follow these instructions at home: Medicines  Take or apply  over-the-counter and prescription medicines only as told by your doctor.  If you were prescribed an antibiotic medicine, take it as told by your doctor. Do not stop using the antibiotic even if you start to feel better.  Ask your doctor if you should take aspirin before long trips. Wound care  Follow instructions from your doctor about how to take care of your wound. Make sure you: ? Wash your hands with soap and water before and after you change your bandage (dressing). If you cannot use soap and water, use hand sanitizer. ? Change your bandage as told by your doctor. ? If you had a skin graft, leave stitches (sutures) in place. These may need to stay in place for 2 weeks or longer. ? Ask when you should remove your bandage. If your bandage is dry and sticks to your leg when you try to remove it, moisten or wet the bandage with saline solution or water to make it easier to remove.  Once your bandage is off, check your wound each day for signs of infection. Have a caregiver do this for you if you are not able to do it yourself. Check for: ? More redness, swelling, or pain. ? More fluid or blood. ? Warmth. ? Pus or a bad smell. Activity  Do not sit for a long time without moving. Get up to take short walks every 1-2 hours. This is  important. Ask for help if you feel weak or unsteady.  Ask your doctor what level of activity is safe for you.  Rest with your legs raised during the day. If you can, keep your legs above the level of your heart for 30 minutes, 3-4 times a day, or as told by your doctor.  Do not sit with your legs crossed. General instructions  Wear elastic stockings, compression stockings, or support hose as told by your doctor.  Raise the foot of your bed as told by your doctor.  Do not use any products that contain nicotine or tobacco, such as cigarettes, e-cigarettes, and chewing tobacco. If you need help quitting, ask your doctor.  Keep all follow-up visits as told by  your doctor. This is important.   Contact a doctor if:  Your ulcer is getting larger or is not healing.  Your pain gets worse. Get help right away if:  You have more redness, swelling, or pain around your ulcer.  You have more fluid or blood coming from your ulcer.  Your ulcer feels warm to the touch.  You have pus or a bad smell coming from your ulcer.  You have a fever. Summary  A venous ulcer is a shallow sore on your lower leg.  Follow instructions from your doctor about how to take care of your wound.  Check your wound each day for signs of infection.  Take over-the-counter and prescription medicines only as told by your doctor.  Keep all follow-up visits as told by your doctor. This is important. This information is not intended to replace advice given to you by your health care provider. Make sure you discuss any questions you have with your health care provider. Document Revised: 09/22/2017 Document Reviewed: 09/22/2017 Elsevier Patient Education  2021 [employer]  Chronic Venous Insufficiency Chronic venous insufficiency is a condition where the leg veins cannot effectively pump blood from the legs to the heart. This happens when the vein walls are either stretched, weakened, or damaged, or when the valves inside the vein are damaged. With the right treatment, you should be able to continue with an active life. This condition is also called venous stasis. What are the causes? Common causes of this condition include:  High blood pressure inside the veins (venous hypertension).  Sitting or standing too long, causing increased blood pressure in the leg veins.  A blood clot that blocks blood flow in a vein (deep vein thrombosis, DVT).  Inflammation of a vein (phlebitis) that causes a blood clot to form.  Tumors in the pelvis that cause blood to back up. What increases the risk? The following factors may make you more likely to develop this condition:  Having a  family history of this condition.  Obesity.  Pregnancy.  Living without enough regular physical activity or exercise (sedentary lifestyle).  Smoking.  Having a job that requires long periods of standing or sitting in one place.  Being a certain age. Women in their 60s and 36s and men in their 60s are more likely to develop this condition. What are the signs or symptoms? Symptoms of this condition include:  Veins that are enlarged, bulging, or twisted (varicose veins).  Skin breakdown or ulcers.  Reddened skin or dark discoloration of skin on the leg between the knee and ankle.  Brown, smooth, tight, and painful skin just above the ankle, usually on the inside of the leg (lipodermatosclerosis).  Swelling of the legs. How is this diagnosed? This condition may  be diagnosed based on:  Your medical history.  A physical exam.  Tests, such as: ? A procedure that creates an image of a blood vessel and nearby organs and provides information about blood flow through the blood vessel (duplex ultrasound). ? A procedure that tests blood flow (plethysmography). ? A procedure that looks at the veins using X-ray and dye (venogram). How is this treated? The goals of treatment are to help you return to an active life and to minimize pain or disability. Treatment depends on the severity of your condition, and it may include:  Wearing compression stockings. These can help relieve symptoms and help prevent your condition from getting worse. However, they do not cure the condition.  Sclerotherapy. This procedure involves an injection of a solution that shrinks damaged veins.  Surgery. This may involve: ? Removing a diseased vein (vein stripping). ? Cutting off blood flow through the vein (laser ablation surgery). ? Repairing or reconstructing a valve within the affected vein.   Follow these instructions at home:  Wear compression stockings as told by your health care provider. These  stockings help to prevent blood clots and reduce swelling in your legs.  Take over-the-counter and prescription medicines only as told by your health care provider.  Stay active by exercising, walking, or doing different activities. Ask your health care provider what activities are safe for you and how much exercise you need.  Drink enough fluid to keep your urine pale yellow.  Do not use any products that contain nicotine or tobacco, such as cigarettes, e-cigarettes, and chewing tobacco. If you need help quitting, ask your health care provider.  Keep all follow-up visits as told by your health care provider. This is important.      Contact a health care provider if you:  Have redness, swelling, or more pain in the affected area.  See a red streak or line that goes up or down from the affected area.  Have skin breakdown or skin loss in the affected area, even if the breakdown is small.  Get an injury in the affected area. Get help right away if:  You get an injury and an open wound in the affected area.  You have: ? Severe pain that does not get better with medicine. ? Sudden numbness or weakness in the foot or ankle below the affected area. ? Trouble moving your foot or ankle. ? A fever. ? Worse or persistent symptoms. ? Chest pain. ? Shortness of breath. Summary  Chronic venous insufficiency is a condition where the leg veins cannot effectively pump blood from the legs to the heart.  Chronic venous insufficiency occurs when the vein walls become stretched, weakened, or damaged, or when valves within the vein are damaged.  Treatment depends on how severe your condition is. It often involves wearing compression stockings and may involve having a procedure.  Make sure you stay active by exercising, walking, or doing different activities. Ask your health care provider what activities are safe for you and how much exercise you need. This information is not intended to replace  advice given to you by your health care provider. Make sure you discuss any questions you have with your health care provider. Document Revised: 10/18/2017 Document Reviewed: 10/18/2017 Elsevier Patient Education  2021 [employer].

## 2020-03-05 LAB — LIPID PANEL
Chol/HDL Ratio: 3.2 ratio (ref 0.0–4.4)
Cholesterol, Total: 211 mg/dL — ABNORMAL HIGH (ref 100–199)
HDL: 66 mg/dL (ref >39)
LDL Chol Calc (NIH): 134 mg/dL — ABNORMAL HIGH (ref 0–99)
Triglycerides: 60 mg/dL (ref 0–149)
VLDL Cholesterol Cal: 11 mg/dL (ref 5–40)

## 2020-03-05 LAB — BASIC METABOLIC PANEL
BUN/Creatinine Ratio: 12 (ref 9–23)
BUN: 12 mg/dL (ref 6–24)
CO2: 26 mmol/L (ref 20–29)
Calcium: 9.6 mg/dL (ref 8.7–10.2)
Chloride: 101 mmol/L (ref 96–106)
Creatinine, Ser: 1.03 mg/dL — ABNORMAL HIGH (ref 0.57–1.00)
GFR calc Af Amer: 71 mL/min/{1.73_m2} (ref >59)
GFR calc non Af Amer: 61 mL/min/{1.73_m2} (ref >59)
Glucose: 136 mg/dL — ABNORMAL HIGH (ref 65–99)
Potassium: 3.7 mmol/L (ref 3.5–5.2)
Sodium: 141 mmol/L (ref 134–144)

## 2020-03-06 ENCOUNTER — Encounter: Payer: Self-pay | Admitting: Family Medicine

## 2020-03-06 DIAGNOSIS — E7800 Pure hypercholesterolemia, unspecified: Secondary | ICD-10-CM

## 2020-03-06 DIAGNOSIS — L97919 Non-pressure chronic ulcer of unspecified part of right lower leg with unspecified severity: Secondary | ICD-10-CM | POA: Insufficient documentation

## 2020-03-06 DIAGNOSIS — M1712 Unilateral primary osteoarthritis, left knee: Secondary | ICD-10-CM | POA: Insufficient documentation

## 2020-03-06 HISTORY — DX: Pure hypercholesterolemia, unspecified: E78.00

## 2020-03-06 NOTE — Assessment & Plan Note (Addendum)
2+ Dps, patient did not tolerate ABI due to pain.  Unna boot placed today.  - Counseled patient about need for weekly visits and serious complications of venous ulcers.  - Reviewed treatment for chronic venous stasis as well as venous ulcer.  - weekly unna boot placement  - follow up with physician in one month for check in on progress  - return precautions provided  - handout provided

## 2020-03-06 NOTE — Progress Notes (Signed)
   SUBJECTIVE:  CHIEF COMPLAINT / HPI:   Leg swelling/Venous insufficiency  Patient checking in today for leg swelling. She has chronic leg swelling that she likes to check in every few months for. She reports using compression stockings at night only, but not at work. She thinks she is taking lasix once daily. She reports hitting her right leg at work a few months ago and sustaining an injury. She reports this scabbed over, but has not yet healed. She reports full sensation in her legs. No fevers, chills, n/v. No unilateral swelling/erythema. She has also been treated for cellulitis in her right leg, but reports that this injury is a separate event and that the cellulitis completely resolved.   Hypertension, unspecified type Patient does not know what medications she is taking. She does not take her BP at home. Per med list, she should be on amlodipine.   Joint Pain  Patient reports b/l knee pain. Stiffness in her legs when she wakes up in the morning and that the pain improves with movement. She reports a 20 lb weight loss over the last year. She is watching her portions and carb intake. The joint pain does not keep her from work. She does not use any ibuprofen. She reports tylenol use only, but with minimal improvement for the first few hours.   BMI 40.0-44.9, adult (HCC) Weight loss as above   Elevated LDL cholesterol level Repeat lipid panel today   PERTINENT  PMH / PSH: Hx of DVT (01/08/2019)  OBJECTIVE:  BP 124/72   Pulse 82   Ht 5\' 6"  (1.676 m)   Wt 258 lb 3.2 oz (117.1 kg)   SpO2 95%   BMI 41.67 kg/m   General: NAD, non-toxic, well-appearing, sitting comfortably in chair    HEENT: [place]/AT. PERRLA. EOMI.  Cardiovascular: RRR, normal S1, S2. B/L 2+ RP. 2+ BLEE Respiratory: CTAB. No IWOB.  Abdomen: + BS. NT, ND, soft to palpation.  Extremities: Warm and well perfused. Moving spontaneously.  Integumentary: No obvious rashes, lesions, trauma on general exam. Woody appearance of  bilateral lower extremities. See pictures below.  Neuro:  CN grossly intact. No FND  Foot exam: 2+ DPS bilaterally. Intact sensation with monofilament testing.       ASSESSMENT/PLAN:  Venous ulcer of right leg (HCC) 2+ Dps, patient did not tolerate ABI due to pain.  Unna boot placed today.  - Counseled patient about need for weekly visits and serious complications of venous ulcers.  - Reviewed treatment for chronic venous stasis as well as venous ulcer.  - weekly unna boot placement  - follow up with physician in one month for check in on progress  - return precautions provided  - handout provided   HTN (hypertension) BP wnl today. Patient to bring her medications to next appointment in one month   Bilateral primary osteoarthritis of knee Patient congratulated on weight loss. Counseled on avoiding multiple doses of ibuprofen, tylenol preferred. Voltaren gel, referral to PT.     , MD [facility]

## 2020-03-06 NOTE — Progress Notes (Signed)
A1C elevated to 6.6. Obtained for obesity evaluation and venous ulcer.  - recommend metformin.   Lipids elevated  - recommend rosuvastatin   Creatinine, mildly elevated. Would suggest recheck in a few weeks. Patient VERY hesitant with labs as she does not like needles. There is a strong chance that she will decline labs.   Spoke to patient over the phone about the results and recommendations for medications. Patient scheduled for appointment with Dr. Dareen Piano on 2/8 to start metformin, rosuvastatin and recheck creatinine. She is scheduled

## 2020-03-06 NOTE — Assessment & Plan Note (Signed)
>>  ASSESSMENT AND PLAN FOR VENOUS ULCER OF RIGHT LEG (HCC) WRITTEN ON 03/06/2020 10:54 AM BY KIM, RACHEL E, MD  2+ Dps, patient did not tolerate ABI due to pain.  Unna boot placed today.  - Counseled patient about need for weekly visits and serious complications of venous ulcers.  - Reviewed treatment for chronic venous stasis as well as venous ulcer.  - weekly unna boot placement  - follow up with physician in one month for check in on progress  - return precautions provided  - handout provided

## 2020-03-06 NOTE — Assessment & Plan Note (Addendum)
BP wnl today. Patient to bring her medications to next appointment in one month. BMP today.

## 2020-03-06 NOTE — Assessment & Plan Note (Addendum)
Patient congratulated on weight loss. Counseled on avoiding multiple doses of ibuprofen, tylenol preferred. Voltaren gel, referral to PT.

## 2020-03-11 ENCOUNTER — Ambulatory Visit: Payer: Medicaid Other

## 2020-03-11 ENCOUNTER — Other Ambulatory Visit: Payer: Self-pay

## 2020-03-11 DIAGNOSIS — L97919 Non-pressure chronic ulcer of unspecified part of right lower leg with unspecified severity: Secondary | ICD-10-CM

## 2020-03-11 DIAGNOSIS — I83019 Varicose veins of right lower extremity with ulcer of unspecified site: Secondary | ICD-10-CM

## 2020-03-11 NOTE — Progress Notes (Signed)
Patient presents in nurse clinic to assess and change una boot.  Una boot removed and leg swelling has improved. Patient reports less "puffiness" around her ankle.  Venous ulcer does appear smaller. No signs of infection. No swelling or redness. Leg looks much better than last week.  Una boot placed today. Patient to come back next week for assessment and placement.  Return precautions given to patient.

## 2020-03-12 ENCOUNTER — Ambulatory Visit: Payer: Medicaid Other

## 2020-03-16 NOTE — Progress Notes (Signed)
    SUBJECTIVE:   CHIEF COMPLAINT / HPI:   Newly diagnosed diabetes: Patient was recently found to have A1c of 6.6%.  An A1c was obtained due to concern for obesity.  Patient was provided with information regarding diabetes pathophysiology and importance of treating diabetes.  A lengthy discussion regarding dietary changes also occurred.  All questions were answered.  Patient agreed to be started on Metformin. 24-hour recall: Breakfast: instant grits, coffee, cream, 1 spoon of sugar Lunch: Breaded fish, fries, water Dinner: spaghetti, broccoli, fruit  Lipids elevated: January 25 lipid panel reviewed, cholesterol elevated at 211, LDL elevated at 134.  Patient's ASCVD risk 12.9%.  With new diagnosis of diabetes patient would certainly benefit from statin.  As she has never been on a statin before agrees to start low-dose for tolerance.  Elevated creatinine: Patient had recent the elevated creatinine.  PCP recommended following up to have this rechecked in a few weeks.  Patient is very hesitant with lab that she does not like needles.  Fortunately, the patient had her lab work done today and her creatinine has returned to lower/normal limit.   PERTINENT  PMH / PSH:  Patient Active Problem List   Diagnosis Date Noted  . Elevated serum creatinine 03/26/2020  . Diabetes mellitus without complication (HCC) 03/18/2020  . Primary osteoarthritis of left knee 03/06/2020  . Elevated LDL cholesterol level 03/06/2020  . Venous ulcer of right leg (HCC) 03/06/2020  . BMI 40.0-44.9, adult (HCC) 06/18/2019  . Encounter for screening mammogram for malignant neoplasm of breast 06/18/2019  . Bilateral leg edema 11/20/2018  . Bilateral primary osteoarthritis of knee 11/12/2016  . Hyperlipidemia 08/18/2012  . HTN (hypertension) 08/18/2012    OBJECTIVE:   BP 138/80   Pulse 78   Wt 258 lb (117 kg)   SpO2 98%   BMI 41.64 kg/m   General: Well-appearing, no apparent distress Cardiac: RRR, S1-S2  present, no murmurs appreciated Respiratory: CTA bilaterally, distant breath sounds  ASSESSMENT/PLAN:   Diabetes mellitus without complication (HCC) Patient's most recent A1c 6.6%. -Patient started on Metformin 500 mg once daily -Patient given information regarding what foods provide sources of carbohydrates and food she should focus on eating with diabetes (vegetables, protein) -Patient to follow-up 2/15 to monitor for any side effects of her medication and increase the dose to 500 mg twice daily if she is tolerating it well  Hyperlipidemia Patient with recently elevated cholesterol and LDL, ASCVD risk 12.9%. -Patient started on Crestor 10 mg a day -Patient to follow-up 2/15 to follow-up for any side effects related to Crestor -Consider rechecking LDL in 3 months (patient very hesitant to have labs drawn)  Elevated serum creatinine -Creatinine rechecked at today's BMP it is within normal limits     Dollene Cleveland, DO [facility]

## 2020-03-18 ENCOUNTER — Other Ambulatory Visit: Payer: Self-pay

## 2020-03-18 ENCOUNTER — Ambulatory Visit: Payer: Medicaid Other

## 2020-03-18 ENCOUNTER — Encounter: Payer: Self-pay | Admitting: Family Medicine

## 2020-03-18 ENCOUNTER — Ambulatory Visit (INDEPENDENT_AMBULATORY_CARE_PROVIDER_SITE_OTHER): Payer: Self-pay | Admitting: Family Medicine

## 2020-03-18 VITALS — BP 138/80 | HR 78 | Wt 258.0 lb

## 2020-03-18 DIAGNOSIS — R7989 Other specified abnormal findings of blood chemistry: Secondary | ICD-10-CM

## 2020-03-18 DIAGNOSIS — E119 Type 2 diabetes mellitus without complications: Secondary | ICD-10-CM

## 2020-03-18 DIAGNOSIS — E785 Hyperlipidemia, unspecified: Secondary | ICD-10-CM

## 2020-03-18 MED ORDER — METFORMIN HCL 500 MG PO TABS: 500.0000 mg | ORAL_TABLET | Freq: Every day | ORAL | 1 refills | Status: DC

## 2020-03-18 MED ORDER — ROSUVASTATIN CALCIUM 10 MG PO TABS: 10.0000 mg | ORAL_TABLET | Freq: Every day | ORAL | 1 refills | Status: DC

## 2020-03-18 NOTE — Patient Instructions (Signed)
Thank you for coming in to see Sarah Macdonald today! Please see below to review our plan for today's visit:  1. Avoid carbohydrates: pasta, rice, noodles, bread, potatoes, fries, and corn, sweets.  2. Aim for vegetables and a protein (chicken, [place], fish) with every meal with breakfast.   Please call the clinic at [phone] if your symptoms worsen or you have any concerns. It was our pleasure to serve you!   Dr. Peggyann Shoals [facility]    Vegetarian Eating Information Many people may prefer vegetarian eating for religious, environmental, or personal reasons. These diets are often lower in calories, salt, sugar, cholesterol, and saturated and trans fats. Vegetarian eating provides significant health benefits. People who eat a vegetarian diet often have lower rates of:  Obesity.  Diabetes.  Breast and colon cancers.  Cardiovascular and gallbladder diseases. What are the types of vegetarian eating? Vegetarian eating includes dietary choices that focus on eating mostly vegetables and fruit, grains, beans, nuts, and seeds. There are several different types of vegetarian eating. Talk with a diet and nutrition specialist (dietitian) about what type of vegetarian diet is best for you. Lacto-ovo vegetarian  Recommended foods: fruits and vegetables, milk and dairy, eggs, grains, soy and vegetable protein, beans, nuts, and seeds.  Foods to avoid: meat, poultry, seafood, animal-based broths and gravies, and gelatin. Lacto-vegetarian  Recommended foods: fruits and vegetables, milk and dairy, grains, soy and vegetable protein, beans, nuts, and seeds.  Foods to avoid: meat, poultry, seafood, animal-based broths and gravies, gelatin, and eggs. Vegan  Recommended foods: fruits and vegetables, grains, soy and vegetable protein, beans, nuts, and seeds.  Foods to avoid: meat, poultry, seafood, animal-based broths and gravies, gelatin, eggs, milk and dairy, and honey.   What do I  need to know about vegetarian eating? All vegetarian diets restrict proteins that come from animals. Foods that come from animals have important nutrients, such as protein, fats, vitamins, and minerals. It is important to get these nutrients from other types of foods. If you think you may not be getting the right nutrients, or if you do not eat any animal products, talk with your health care provider or dietitian about taking supplements. A dietitian can help determine your individual nutrient needs. What are tips for following this plan? Eat a diet that includes a variety of fruits, vegetables, whole grains, and protein sources. This is important to make sure you get enough of the following nutrients: Protein Healthy protein sources include:  Eggs, milk, and cheese. Soy products. Tofu, tempeh, and textured vegetable protein (TVP). Quinoa. Hemp seeds. Other protein sources include:  Beans, such as black beans or kidney beans. Other legumes, such as lentils and split peas. Nuts, such as almonds, [place] nuts, and pecans. Seeds, such as sunflower seeds. To get the most benefit from plant-based proteins, combine two or more sources of plant protein with whole grains in one dish. Examples include beans and rice, almond butter on bread, or sunflower seeds on noodles. Vitamin B12 Sources of vitamin B12 include:  Cheese and eggs. Breakfast cereals and other prepared products that have vitamin B12 added (fortified products). If you eat a vegan diet, ask your health care provider or dietitian about taking a B12 supplement. Vitamin D Good sources of vitamin D include:  Egg yolks. Fortified dairy products. Fortified orange juice. Mushrooms. Cereals with added vitamin D. Another way of getting vitamin D is to spend 10 minutes each day in the sun. This helps your body make its  own vitamin D. Depending on your age, you may need to take a vitamin D supplement. Talk with your health care provider or dietitian  about how much vitamin D you need in a supplement. Iron Healthy sources of iron include:  Dark, leafy greens. Nuts. Beans. Grain products that are fortified with iron, such as cereals. Tofu, tempeh, soybeans, and quinoa. To get the most iron from plant-based foods:  Eat iron-containing plant-based foods with vitamin C. For example, squeeze fresh lemon juice over cooked greens like kale, chard, or spinach, or have a glass of orange juice with your meals.  Avoid eating dairy products, coffee, or tea with iron-containing foods. Omega-3 fatty acids Good sources of omega-3 fatty acids include:  Walnuts. Flax seeds, canola oil, soybean oil, and tofu. Avocados. Olives and olive oil. Foods with added omega-3 fatty acids, such as eggs, milk, and juices. Calcium Good sources of calcium include:  Dairy products. Fortified non-dairy milk. Fortified tofu. Dark, leafy greens, such as kale, bok choy, Chinese cabbage, collard greens and mustard greens. Broccoli. Okra. Fortified breakfast cereals and fruit juices. Figs. Zinc Good sources of zinc include:  Pumpkin seeds. Legumes, such as chickpeas, kidney beans, and green peas. Wheat germ, whole grains, and fortified cereals. Mushrooms. Spinach and kale. Milk and dairy foods. Dark chocolate. Summary  Vegetarian eating is a choice made by people who prefer vegetarian eating for religious, environmental, or personal reasons. These diets can provide significant health benefits.  There are several types of vegetarian diets, but all restrict proteins that come from animals.  It is important to make sure that you are getting enough nutrients, including protein, vitamin B12, vitamin D, iron, omega-3 fatty acids, calcium, and zinc from your diet.  If you think you are not getting the right nutrients or if you do not eat any animal products, talk with your health care provider or dietitian.

## 2020-03-19 ENCOUNTER — Other Ambulatory Visit: Payer: Self-pay | Admitting: Family Medicine

## 2020-03-19 DIAGNOSIS — I10 Essential (primary) hypertension: Secondary | ICD-10-CM

## 2020-03-19 LAB — BASIC METABOLIC PANEL
BUN/Creatinine Ratio: 11 (ref 9–23)
BUN: 10 mg/dL (ref 6–24)
CO2: 25 mmol/L (ref 20–29)
Calcium: 10.1 mg/dL (ref 8.7–10.2)
Chloride: 101 mmol/L (ref 96–106)
Creatinine, Ser: 0.91 mg/dL (ref 0.57–1.00)
GFR calc Af Amer: 82 mL/min/{1.73_m2} (ref >59)
GFR calc non Af Amer: 71 mL/min/{1.73_m2} (ref >59)
Glucose: 102 mg/dL — ABNORMAL HIGH (ref 65–99)
Potassium: 3.9 mmol/L (ref 3.5–5.2)
Sodium: 143 mmol/L (ref 134–144)

## 2020-03-25 ENCOUNTER — Ambulatory Visit: Payer: Medicaid Other

## 2020-03-25 ENCOUNTER — Ambulatory Visit: Payer: Medicaid Other | Admitting: Family Medicine

## 2020-03-26 ENCOUNTER — Ambulatory Visit: Payer: Medicaid Other

## 2020-03-26 DIAGNOSIS — R7989 Other specified abnormal findings of blood chemistry: Secondary | ICD-10-CM | POA: Insufficient documentation

## 2020-03-26 NOTE — Assessment & Plan Note (Signed)
-  Creatinine rechecked at today's BMP it is within normal limits

## 2020-03-26 NOTE — Assessment & Plan Note (Signed)
Patient's most recent A1c 6.6%. -Patient started on Metformin 500 mg once daily -Patient given information regarding what foods provide sources of carbohydrates and food she should focus on eating with diabetes (vegetables, protein) -Patient to follow-up 2/15 to monitor for any side effects of her medication and increase the dose to 500 mg twice daily if she is tolerating it well

## 2020-03-26 NOTE — Assessment & Plan Note (Addendum)
Patient with recently elevated cholesterol and LDL, ASCVD risk 12.9%. -Patient started on Crestor 10 mg a day -Patient to follow-up 2/15 to follow-up for any side effects related to Crestor -Consider rechecking LDL in 3 months (patient very hesitant to have labs drawn)

## 2020-04-01 ENCOUNTER — Other Ambulatory Visit: Payer: Self-pay | Admitting: Family Medicine

## 2020-04-01 DIAGNOSIS — I10 Essential (primary) hypertension: Secondary | ICD-10-CM

## 2020-04-04 ENCOUNTER — Telehealth: Payer: Self-pay

## 2020-04-04 NOTE — Telephone Encounter (Signed)
Patient calls nurse line regarding increased sweat and irritation under breast. Advised patient to keep the area as clean and dry as possible and to wear a well fitting cotton bra to avoid trapping of moisture.   Please advise additional recommendations or topical agents that may be helpful for patient. Patient declines to schedule appointment at this time.  Veronda Prude, RN

## 2020-04-04 NOTE — Telephone Encounter (Signed)
These interventions are appropriate. Sometimes, a powder, like a baby powder, can be helpful to keep that area dry. If she has any concerns about itching or skin breakdown, she can be seen in clinic for assessment of possible fungal infection. Mirian Mo, MD

## 2020-04-10 ENCOUNTER — Telehealth: Payer: Self-pay

## 2020-04-10 NOTE — Telephone Encounter (Signed)
Opened in error.   Sabastion Hrdlicka C Jaken Fregia, RN  

## 2020-04-16 ENCOUNTER — Other Ambulatory Visit: Payer: Self-pay

## 2020-04-16 DIAGNOSIS — I10 Essential (primary) hypertension: Secondary | ICD-10-CM

## 2020-04-16 MED ORDER — AMLODIPINE BESYLATE 5 MG PO TABS: ORAL_TABLET | ORAL | 1 refills | Status: DC

## 2020-05-20 ENCOUNTER — Other Ambulatory Visit: Payer: Self-pay | Admitting: Family Medicine

## 2020-05-20 DIAGNOSIS — I10 Essential (primary) hypertension: Secondary | ICD-10-CM

## 2020-05-27 NOTE — Progress Notes (Signed)
   SUBJECTIVE:  CHIEF COMPLAINT / HPI:   Chronic venous insufficiency Patient was seen in January for venous ulcer of her right leg.  She had a follow-up in nurse clinic for wrapping and notes show that ulcer was improving.  Patient did not follow-up for any other leg wraps.  Patient comes in today with concerns of changes in her skin on the back of her right leg.  She denies any pain, weeping, fevers, chills.  She is wearing compression stockings at this time and also reports that she takes Lasix daily.  She is requesting some cream.  BMI 40.0-44.9, adult [facility]) Patient is down 16 pounds from last visit.  She reports that she is dieting and exercising.  PERTINENT  PMH / PSH: Hx of DVT (01/08/2019)  OBJECTIVE:  BP 134/80   Pulse 72   Ht 5\' 6"  (1.676 m)   Wt 242 lb 6.4 oz (110 kg)   SpO2 94%   BMI 39.12 kg/m   General: NAD, non-toxic, well-appearing, sitting comfortably in chair    HEENT: [place]/AT. PERRLA. EOMI.  Cardiovascular: RRR, normal S1, S2. B/L 2+ RP. 2+ BLEE Integumentary: No obvious rashes, lesions, trauma on general exam. Woody appearance of bilateral lower extremities. See pictures below.           ASSESSMENT/PLAN:  Chronic venous insufficiency Attempted for ABI today, however, patient did not tolerate.  She denies any claudication pain with exercise (which she has been doing more with a recent 16 pound weight loss).  Discontinued amlodipine and started losartan 25 mg for high blood pressure.  Patient should follow-up in 3 to 5 days after starting losartan for lab check.  We will follow-up with her in a month for blood pressure check and check in for venous insufficiency.  Return precautions provided.  Also provided patient information about chronic venous insufficiency, leg elevation, compression stockings.  HTN (hypertension) BP 134/80 today.  Switching amlodipine to losartan given chronic venous insufficiency.  Decided against hydrochlorothiazide as patient takes daily  Lasix.  We will see how patient tolerates this medication with follow-up in 1 month and lab follow-up in 3 to 5 days after starting losartan.  Return precautions and side effects discussed.    , MD [facility]

## 2020-05-28 ENCOUNTER — Encounter: Payer: Self-pay | Admitting: Family Medicine

## 2020-05-28 ENCOUNTER — Ambulatory Visit (INDEPENDENT_AMBULATORY_CARE_PROVIDER_SITE_OTHER): Payer: Self-pay | Admitting: Family Medicine

## 2020-05-28 ENCOUNTER — Other Ambulatory Visit: Payer: Self-pay

## 2020-05-28 VITALS — BP 134/80 | HR 72 | Ht 66.0 in | Wt 242.4 lb

## 2020-05-28 DIAGNOSIS — I872 Venous insufficiency (chronic) (peripheral): Secondary | ICD-10-CM

## 2020-05-28 DIAGNOSIS — L98499 Non-pressure chronic ulcer of skin of other sites with unspecified severity: Secondary | ICD-10-CM

## 2020-05-28 DIAGNOSIS — I10 Essential (primary) hypertension: Secondary | ICD-10-CM

## 2020-05-28 MED ORDER — LOSARTAN POTASSIUM 25 MG PO TABS: 25.0000 mg | ORAL_TABLET | Freq: Every day | ORAL | 0 refills | Status: DC

## 2020-05-28 NOTE — Patient Instructions (Addendum)
Chronic Venous Insufficiency  I have printed some information about this. Please refer to the packet.  - compression stockings - Ideally, wear stockings with pressure of 20-30 mmHg, but most importantly, find stockings that are comfortable for you and that you will wear  - elevate your legs  - continue weight loss and exercising  - switching blood pressure medication. Amlodipine can make the swelling in your legs worse. STOP the amlodipine, START the losartan 25 mg  - please come back for a lab visit 3-5 days after starting the Losartan. Please call ahead to make a lab appt. [phone] - follow up in one month for blood pressure check   Losartan Medicine information  Please stop taking this medication and seek care if you experience any dizziness, lightheadedness, severe swelling of the face or legs, or have severely decreased urination.

## 2020-05-28 NOTE — Assessment & Plan Note (Signed)
BP 134/80 today.  Switching amlodipine to losartan given chronic venous insufficiency.  Decided against hydrochlorothiazide as patient takes daily Lasix.  We will see how patient tolerates this medication with follow-up in 1 month and lab follow-up in 3 to 5 days after starting losartan.  Return precautions and side effects discussed.

## 2020-05-28 NOTE — Assessment & Plan Note (Signed)
Attempted for ABI today, however, patient did not tolerate.  She denies any claudication pain with exercise (which she has been doing more with a recent 16 pound weight loss).  Discontinued amlodipine and started losartan 25 mg for high blood pressure.  Patient should follow-up in 3 to 5 days after starting losartan for lab check.  We will follow-up with her in a month for blood pressure check and check in for venous insufficiency.  Return precautions provided.  Also provided patient information about chronic venous insufficiency, leg elevation, compression stockings.

## 2020-06-02 ENCOUNTER — Telehealth: Payer: Self-pay

## 2020-06-02 NOTE — Telephone Encounter (Signed)
Patient calls nurse line requesting medicated ointment or cream for her legs. Patient states that she saw provider on 4/20 and forgot to request the medication. Please advise if medicated ointment or cream would be appropriate for this concern.   Veronda Prude, RN

## 2020-06-03 NOTE — Telephone Encounter (Signed)
Patient returns call to nurse line to check status of receiving medicated cream for legs.   Veronda Prude, RN

## 2020-06-03 NOTE — Telephone Encounter (Signed)
Called patient and spoke to her about issue. She is not having any erythema, pruritis, oozing and therefore does not need any steroid creams. Recommended unscented cream/emollients for the dry skin she is experiencing. Ultimately, conservative management for the venous stasis will also help improve her skin. She is understanding. No further questions.   Melene Plan, M.D.  12:42 PM 06/03/2020

## 2020-06-09 NOTE — Telephone Encounter (Signed)
Patient calls nurse line stating she forgot the names of lotions PCP recommended for her legs. Patient apologizes for the inconvenience. If you provide me with some names I will be happy to call her back. Please advise.

## 2020-06-10 NOTE — Telephone Encounter (Signed)
Patient calls back to nurse line. Patient given the names of lotions/creams she can try. Patient appreciative.

## 2020-07-08 ENCOUNTER — Other Ambulatory Visit: Payer: Self-pay | Admitting: Family Medicine

## 2020-07-08 DIAGNOSIS — I10 Essential (primary) hypertension: Secondary | ICD-10-CM

## 2020-08-29 ENCOUNTER — Other Ambulatory Visit: Payer: Self-pay | Admitting: Family Medicine

## 2020-08-29 DIAGNOSIS — I10 Essential (primary) hypertension: Secondary | ICD-10-CM

## 2020-09-17 ENCOUNTER — Other Ambulatory Visit: Payer: Self-pay | Admitting: Family Medicine

## 2020-09-18 ENCOUNTER — Other Ambulatory Visit: Payer: Self-pay | Admitting: Family Medicine

## 2020-09-18 NOTE — Telephone Encounter (Signed)
Patient calls nurse line regarding rx refill for losartan. Patient reports that she took last pill yesterday. Requesting refill as soon as possible.   Patient is not currently having symptoms, however, is concerned about being without medication.   Will forward to preceptor as PCP is post nights.   Veronda Prude, RN

## 2020-09-18 NOTE — Telephone Encounter (Signed)
Rx to pharmacy thank you

## 2020-09-19 ENCOUNTER — Other Ambulatory Visit: Payer: Self-pay

## 2020-09-19 DIAGNOSIS — E119 Type 2 diabetes mellitus without complications: Secondary | ICD-10-CM

## 2020-09-19 MED ORDER — METFORMIN HCL 500 MG PO TABS: 500.0000 mg | ORAL_TABLET | Freq: Every day | ORAL | 1 refills | Status: DC

## 2020-09-19 NOTE — Telephone Encounter (Signed)
Patient calls nurse line regarding rx for metformin. Patient reports that she could only afford to pick up BP medication.   Once rx is sent to pharmacy, I will call and see the cost and see if we could possibly help patient through indigent fund.   Please advise.   Veronda Prude, RN

## 2020-10-02 ENCOUNTER — Other Ambulatory Visit: Payer: Self-pay | Admitting: Family Medicine

## 2020-10-02 DIAGNOSIS — I10 Essential (primary) hypertension: Secondary | ICD-10-CM

## 2020-10-31 ENCOUNTER — Ambulatory Visit: Payer: Medicaid Other | Admitting: Family Medicine

## 2020-11-23 ENCOUNTER — Other Ambulatory Visit: Payer: Self-pay | Admitting: Family Medicine

## 2020-11-23 DIAGNOSIS — I10 Essential (primary) hypertension: Secondary | ICD-10-CM

## 2021-01-17 ENCOUNTER — Other Ambulatory Visit: Payer: Self-pay | Admitting: Family Medicine

## 2021-01-17 DIAGNOSIS — I10 Essential (primary) hypertension: Secondary | ICD-10-CM

## 2021-03-02 ENCOUNTER — Ambulatory Visit: Payer: Medicaid Other

## 2021-03-02 NOTE — Patient Instructions (Incomplete)
You are due for a physical with your primary care physician.  I recommend that you schedule this in the next 1 month.  They will discuss some health maintenance items that are recommended based off of your age and gender.  They may also do blood work.

## 2021-03-02 NOTE — Progress Notes (Deleted)
° ° °  SUBJECTIVE:   CHIEF COMPLAINT / HPI:   Numbness in feet: ***. Most recent A1c of 6.6 last year  PERTINENT  PMH / PSH: ***  OBJECTIVE:   There were no vitals taken for this visit. ***  General: NAD, pleasant, able to participate in exam Respiratory: No respiratory distress MSK: *** Psych: Normal affect and mood  ASSESSMENT/PLAN:   No problem-specific Assessment & Plan notes found for this encounter.  A1c today of***.     Jackelyn Poling, DO [facility]

## 2021-03-11 ENCOUNTER — Ambulatory Visit: Payer: Medicaid Other | Admitting: Family Medicine

## 2021-03-19 ENCOUNTER — Telehealth: Payer: Self-pay

## 2021-03-19 NOTE — Telephone Encounter (Signed)
Patient calls nurse line regarding "feet falling asleep." Patient reports that this happens intermittently and alternates between right and left foot being affected. Onset approx one week.  Denies urine or stool incontinence or pain.   Advised patient that she would need an appointment for evaluation as this can not be adequately evaluated over the phone.   Patient states that she will monitor at home and call back if she wants to schedule an appointment.   Provided with return precautions.   Veronda Prude, RN

## 2021-03-28 ENCOUNTER — Other Ambulatory Visit: Payer: Self-pay | Admitting: Family Medicine

## 2021-03-28 DIAGNOSIS — I10 Essential (primary) hypertension: Secondary | ICD-10-CM

## 2021-04-02 ENCOUNTER — Other Ambulatory Visit: Payer: Self-pay | Admitting: Family Medicine

## 2021-04-02 DIAGNOSIS — E119 Type 2 diabetes mellitus without complications: Secondary | ICD-10-CM

## 2021-04-21 ENCOUNTER — Ambulatory Visit: Payer: Medicaid Other

## 2021-04-23 ENCOUNTER — Ambulatory Visit: Payer: Medicaid Other

## 2021-04-29 ENCOUNTER — Ambulatory Visit: Payer: Medicaid Other | Admitting: Family Medicine

## 2021-05-05 ENCOUNTER — Ambulatory Visit (INDEPENDENT_AMBULATORY_CARE_PROVIDER_SITE_OTHER): Payer: Self-pay | Admitting: Family Medicine

## 2021-05-05 ENCOUNTER — Other Ambulatory Visit: Payer: Self-pay

## 2021-05-05 VITALS — BP 164/96 | HR 82 | Wt 243.4 lb

## 2021-05-05 DIAGNOSIS — L97919 Non-pressure chronic ulcer of unspecified part of right lower leg with unspecified severity: Secondary | ICD-10-CM

## 2021-05-05 DIAGNOSIS — E785 Hyperlipidemia, unspecified: Secondary | ICD-10-CM

## 2021-05-05 DIAGNOSIS — G4762 Sleep related leg cramps: Secondary | ICD-10-CM

## 2021-05-05 DIAGNOSIS — I10 Essential (primary) hypertension: Secondary | ICD-10-CM

## 2021-05-05 DIAGNOSIS — I872 Venous insufficiency (chronic) (peripheral): Secondary | ICD-10-CM

## 2021-05-05 DIAGNOSIS — E119 Type 2 diabetes mellitus without complications: Secondary | ICD-10-CM

## 2021-05-05 NOTE — Patient Instructions (Signed)
It was wonderful to see you today. Thank you for allowing me to be a part of your care. Below is a short summary of what we discussed at your visit today: ? ?Blood pressure ?Your pressure is elevated today. We also got blood work. I will wait until your blood work comes back in order to make any changes to your medicines.  ? ?Leg swelling ?I believe this is due to your chronic venous insufficiency.  Remember to wear your compression stockings every day during the day, put them on first thing when you get up.  Wear them through the evening and take off before bed. ? ?If you continue to have leg swelling or it worsens despite using the compression stockings like this, let us know and we can consider sending you to a vein and vascular specialist. ? ?Medicine refills ?I will wait to refill your multivitamin and cholesterol medicine until after your lab results come back.  This is in case I need to adjust any medication levels. ? ?Foot pain ?I suggest getting good arch support insoles for your shoes.  Below is a brand that the sports medicine clinic recommends often.  It is called "Hapad" and is available for sale online.  See the website below. ? ? ?Cooking and Nutrition Classes ?[employer] in [place] provides many classes at low or no cost to [employer], nutrition, and agriculture.  Their website offers a huge variety of information related to topics such as gardening, nutrition, cooking, parenting, and health.  Also listed are classes and events, both online and in-person.  Check out their website here: [email or web address]  ? ?Please bring all of your medications to every appointment! ? ?If you have any questions or concerns, please do not hesitate to contact us via phone or MyChart message.  ? ?Ezequiel Essex, MD  ?

## 2021-05-05 NOTE — Progress Notes (Signed)
? ? ?  SUBJECTIVE:  ? ?CHIEF COMPLAINT / HPI:  ? ?Lower extremity swelling and pain ?- Patient previously diagnosed with chronic venous insufficiency ?- Last seen for lower extremity swelling and pain April 2022 by Dr. Maudie Mercury ?- In past, DVT ultrasound negative ?- Today, patient reports chronic swelling that is unchanged ?- Denies unilateral leg redness or pain, does not believe 1 leg is more large than the other, denies claudication symptoms with activity ?- Was previously told to wear compression socks, however she does wear them only at nighttime and not during the day ? ?Bilateral foot pain ?- Patient endorses chronic bilateral foot pain at the distal metatarsals ?- Aggravating factors: Activity, walking ?- Relieving factors: Rest, putting her feet up ?- Has not tried anything for pain aside from OTC such as Tylenol ibuprofen ?- Reports this is more of a dull constant ache, denies tingling, numbness, or electric shock type pain ? ?Nocturnal leg cramps ?- Chronic intermittent nocturnal leg cramps for a number of years ?- Gradually worsening ?- Only nocturnal, does not occur during the day or with activity ? ? ?PERTINENT  PMH / PSH: HTN, T2DM, chronic venous insufficiency, HLD, bilateral knee osteoarthritis ? ?OBJECTIVE:  ?BP (!) 164/96   Pulse 82   Wt 243 lb 6.4 oz (110.4 kg)   SpO2 96%   BMI 39.29 kg/m?   ? ?PHQ-9:  ? ?  05/28/2020  ? 10:10 AM 03/18/2020  ?  9:45 AM 03/04/2020  ?  3:12 PM  ?Depression screen PHQ 2/9  ?Decreased Interest 0 0 0  ?Down, Depressed, Hopeless 0 0 0  ?PHQ - 2 Score 0 0 0  ?Altered sleeping 0 0 0  ?Tired, decreased energy 0 0 0  ?Change in appetite 0 0 0  ?Feeling bad or failure about yourself  0 0 0  ?Trouble concentrating 0 0 0  ?Moving slowly or fidgety/restless 0 0 0  ?Suicidal thoughts 0 0 0  ?PHQ-9 Score 0 0 0  ?Difficult doing work/chores   Not difficult at all  ?  ?Physical Exam ?General: Awake, alert, oriented ?Cardiovascular: Regular rate and rhythm, S1 and S2 present, no murmurs  auscultated ?Respiratory: Lung fields clear to auscultation bilaterally ?Extremities: BLE edema to level of knee, equal bilaterally, woody venous stasis bilaterally, no visible open or draining wounds, bilateral feet equally swollen, palpable dorsal pulse and pretibial pulse, bilateral plantar calluses of both feet, however no wounds/rashes/lesions ? ?ASSESSMENT/PLAN:  ? ?HTN (hypertension) ?BP today elevated.  Patient reports compliance to meds patient started on losartan approximately 1 year ago, did not return for a BMP to check kidney function.  We will collect BMP for creatinine prior to prescribing losartan.  Expect to need an adjustment to control of blood pressure. ? ?Diabetes mellitus without complication ([place]) ?Due for recheck today.  Doing well on metformin, tolerating without adverse side effects such as GI upset.  See AVS for more ? ?Hyperlipidemia ?Due for lipid panel.  Expect to increase statin, will await results. ? ?Nocturnal leg cramps ?Chronic.  No red flags, no claudication symptoms.  Checking BMP and magnesium today to rule out electrolyte disturbances.  We will follow-up results. ?  ? ?Ezequiel Essex, MD ?[place]  ?

## 2021-05-06 ENCOUNTER — Telehealth: Payer: Self-pay | Admitting: Family Medicine

## 2021-05-06 ENCOUNTER — Telehealth: Payer: Self-pay

## 2021-05-06 DIAGNOSIS — I10 Essential (primary) hypertension: Secondary | ICD-10-CM

## 2021-05-06 DIAGNOSIS — E785 Hyperlipidemia, unspecified: Secondary | ICD-10-CM

## 2021-05-06 LAB — LIPID PANEL
Chol/HDL Ratio: 2.7 ratio (ref 0.0–4.4)
Cholesterol, Total: 195 mg/dL (ref 100–199)
HDL: 72 mg/dL (ref >39)
LDL Chol Calc (NIH): 107 mg/dL — ABNORMAL HIGH (ref 0–99)
Triglycerides: 90 mg/dL (ref 0–149)
VLDL Cholesterol Cal: 16 mg/dL (ref 5–40)

## 2021-05-06 LAB — MAGNESIUM: Magnesium: 2.1 mg/dL (ref 1.6–2.3)

## 2021-05-06 LAB — BASIC METABOLIC PANEL
BUN/Creatinine Ratio: 15 (ref 9–23)
BUN: 13 mg/dL (ref 6–24)
CO2: 28 mmol/L (ref 20–29)
Calcium: 10.1 mg/dL (ref 8.7–10.2)
Chloride: 100 mmol/L (ref 96–106)
Creatinine, Ser: 0.86 mg/dL (ref 0.57–1.00)
Glucose: 90 mg/dL (ref 70–99)
Potassium: 4.3 mmol/L (ref 3.5–5.2)
Sodium: 140 mmol/L (ref 134–144)
eGFR: 79 mL/min/{1.73_m2} (ref >59)

## 2021-05-06 LAB — HEMOGLOBIN A1C
Est. average glucose Bld gHb Est-mCnc: 140 mg/dL
Hgb A1c MFr Bld: 6.5 % — ABNORMAL HIGH (ref 4.8–5.6)

## 2021-05-06 MED ORDER — LOSARTAN POTASSIUM 50 MG PO TABS: 50.0000 mg | ORAL_TABLET | Freq: Every day | ORAL | 0 refills | Status: DC

## 2021-05-06 MED ORDER — ROSUVASTATIN CALCIUM 20 MG PO TABS: 20.0000 mg | ORAL_TABLET | Freq: Every day | ORAL | 3 refills | Status: DC

## 2021-05-06 NOTE — Telephone Encounter (Signed)
We discussed waiting to refill her meds until the lab results come back. I want to review her cholesterol as well as creatinine and potassium before refilling her statin and BP meds. May have to make dose adjustments.  ? ?Fayette Pho, MD ? ?

## 2021-05-06 NOTE — Telephone Encounter (Signed)
Patient calls nurse line reporting there are no prescriptions at her pharmacy.  ? ?Patient reports she was seen yesterday and 2 prescriptions were supposed to be called in. Patient does not recall what they are for. I do not see where anything was sent in yesterday.  ? ?Will forward to provider who saw patient.  ?

## 2021-05-06 NOTE — Telephone Encounter (Signed)
Called patient to discuss labs and med changes.  Discussed ASCVD 10-year risk of 20.4%, normal creatinine, and uncontrolled HTN. Increase losartan from 25 mg to 50 mg with BP recheck in 1 week.  Increase rosuvastatin from 10 mg to 20 mg with direct LDL in 3 months. ? ?Offered to make appointment for patient while on the phone, however she declines.  She will call back later and schedule her 1 week follow-up. ? ?Fayette Pho, MD ? ?

## 2021-05-08 ENCOUNTER — Encounter: Payer: Self-pay | Admitting: Family Medicine

## 2021-05-08 NOTE — Assessment & Plan Note (Signed)
Due for recheck today.  Doing well on metformin, tolerating without adverse side effects such as GI upset.  See AVS for more ?

## 2021-05-08 NOTE — Assessment & Plan Note (Signed)
Chronic.  No red flags, no claudication symptoms.  Checking BMP and magnesium today to rule out electrolyte disturbances.  We will follow-up results. ?

## 2021-05-08 NOTE — Assessment & Plan Note (Signed)
Due for lipid panel.  Expect to increase statin, will await results. ?

## 2021-05-08 NOTE — Assessment & Plan Note (Signed)
BP today elevated.  Patient reports compliance to meds patient started on losartan approximately 1 year ago, did not return for a BMP to check kidney function.  We will collect BMP for creatinine prior to prescribing losartan.  Expect to need an adjustment to control of blood pressure. ?

## 2021-05-28 ENCOUNTER — Ambulatory Visit: Payer: Medicaid Other | Admitting: Family Medicine

## 2021-05-29 ENCOUNTER — Ambulatory Visit (INDEPENDENT_AMBULATORY_CARE_PROVIDER_SITE_OTHER): Payer: Self-pay | Admitting: Family Medicine

## 2021-05-29 ENCOUNTER — Encounter: Payer: Self-pay | Admitting: Family Medicine

## 2021-05-29 VITALS — BP 150/84 | HR 70

## 2021-05-29 DIAGNOSIS — R200 Anesthesia of skin: Secondary | ICD-10-CM

## 2021-05-29 DIAGNOSIS — R202 Paresthesia of skin: Secondary | ICD-10-CM

## 2021-05-29 DIAGNOSIS — I10 Essential (primary) hypertension: Secondary | ICD-10-CM

## 2021-05-29 MED ORDER — LOSARTAN POTASSIUM 100 MG PO TABS: 100.0000 mg | ORAL_TABLET | Freq: Every day | ORAL | 2 refills | Status: DC

## 2021-05-29 MED ORDER — GABAPENTIN 100 MG PO CAPS: 100.0000 mg | ORAL_CAPSULE | Freq: Three times a day (TID) | ORAL | 3 refills | Status: DC

## 2021-05-29 NOTE — Progress Notes (Signed)
? ? ?  SUBJECTIVE:  ? ?CHIEF COMPLAINT / HPI:  ? ?HTN ?Last saw [facility] 05/05/21, BP was elevated at that time, collected BMP, told to return for recheck. ?Currently on losartan 50 mg, furosemide 40 mg ?Tolerating medications well without side effects ?Takes her medications every day without fail ?Denies headaches, dizziness, vision changes, blurry vision, difficulty using arms or legs ? ?BP Readings from Last 3 Encounters:  ?05/29/21 (!) 150/84  ?05/05/21 (!) 164/96  ?05/28/20 134/80  ?  ?Lab Results  ?Component Value Date  ? CREATININE 0.86 05/05/2021  ? CREATININE 0.91 03/18/2020  ? CREATININE 1.03 (H) 03/04/2020  ? ?Bilateral foot numbness and tingling ?Continuous, persistent ?Previously thought to be due to chronic venous insufficiency and swelling plus diabetes ?She reports she was told to wear compression stockings, however does not wear them often because the toes are uncomfortable ?She request medication for this ? ?PERTINENT  PMH / PSH: HTN, T2DM, HLD, obese, primary OA left knee, chronic venous insufficiency ? ?OBJECTIVE:  ? ?BP (!) 150/84   Pulse 70   SpO2 94%   ? ?PHQ-9:  ? ?  05/29/2021  ?  3:03 PM 05/28/2020  ? 10:10 AM 03/18/2020  ?  9:45 AM  ?Depression screen PHQ 2/9  ?Decreased Interest 0 0 0  ?Down, Depressed, Hopeless 0 0 0  ?PHQ - 2 Score 0 0 0  ?Altered sleeping 0 0 0  ?Tired, decreased energy 0 0 0  ?Change in appetite 0 0 0  ?Feeling bad or failure about yourself  0 0 0  ?Trouble concentrating 0 0 0  ?Moving slowly or fidgety/restless 0 0 0  ?Suicidal thoughts 0 0 0  ?PHQ-9 Score 0 0 0  ? ?Physical Exam ?General: Awake, alert, oriented ?Cardiovascular: Regular rate and rhythm, S1 and S2 present, no murmurs auscultated ?Respiratory: Lung fields clear to auscultation bilaterally ?Extremities: 1+ pitting edema BLE with woody venous deficiency changes of anterior shins bilaterally ? ?ASSESSMENT/PLAN:  ? ?HTN (hypertension) ?Uncontrolled today.  Last BMP shows stable creatinine.  Patient adamantly  declines starting new additional medication at this time. Continue torsemide 40 mg, increase losartan from 50 mg to 100 mg.  Return in 1 to 2 weeks for follow-up and BMP at that time. ? ?Numbness and tingling of foot ?Chronic.  Likely multifactorial and due to concurrent diabetes and chronic venous insufficiency.  Start gabapentin 100 mg nightly, increase to twice daily dosing.  Return precautions given, see AVS for more. ?  ? ? ?Ezequiel Essex, MD ?[place]  ?

## 2021-05-29 NOTE — Assessment & Plan Note (Signed)
Uncontrolled today.  Last BMP shows stable creatinine.  Patient adamantly declines starting new additional medication at this time. Continue torsemide 40 mg, increase losartan from 50 mg to 100 mg.  Return in 1 to 2 weeks for follow-up and BMP at that time. ?

## 2021-05-29 NOTE — Patient Instructions (Signed)
It was wonderful to see you today. Thank you for allowing me to be a part of your care. Below is a short summary of what we discussed at your visit today: ? ?Blood pressure ?Your blood pressure is still high, both over the top and the bottom. ? ?Continue taking furosemide 40 mg every day. ? ?Increase your losartan from 50 mg daily to 100 mg daily.  I have sent in a new prescription for this.  Until you pick up your new prescription, you may still use your old prescription.  Make sure to take 2 of the 50 mg pills to make 100 mg total a day. ? ?Take your blood pressure every day or every other day at home and keep a log.  Please bring the paper back into [place] next time you come. ? ?Come back in 1 to 2 weeks for recheck of her blood pressure and to make sure her kidneys are tolerating this well. ? ?Come back for care or go to urgent care/ED if: ?You are dizzy when you stand up ?You nearly pass out or pass out ?You feel lightheaded ?Your blood pressure at home is too low ? ?Numb and tingly feet ?Start gabapentin 1 tablet nightly.  If you tolerate this well, you may increase to twice daily (once in the morning once at bedtime).  If you are still having symptoms with twice daily dosing, call us and we can increase the dosage. ? ? ? ?Please bring all of your medications to every appointment! ? ?If you have any questions or concerns, please do not hesitate to contact us via phone or MyChart message.  ? ?Fayette Pho, MD  ?

## 2021-05-29 NOTE — Assessment & Plan Note (Signed)
Chronic.  Likely multifactorial and due to concurrent diabetes and chronic venous insufficiency.  Start gabapentin 100 mg nightly, increase to twice daily dosing.  Return precautions given, see AVS for more. ?

## 2021-05-29 NOTE — Assessment & Plan Note (Signed)
>>  ASSESSMENT AND PLAN FOR NUMBNESS AND TINGLING OF FOOT WRITTEN ON 05/29/2021  7:04 PM BY Fayette Pho, MD  Chronic.  Likely multifactorial and due to concurrent diabetes and chronic venous insufficiency.  Start gabapentin 100 mg nightly, increase to twice daily dosing.  Return precautions given, see AVS for more.

## 2021-06-05 ENCOUNTER — Other Ambulatory Visit: Payer: Self-pay | Admitting: Family Medicine

## 2021-06-05 DIAGNOSIS — I10 Essential (primary) hypertension: Secondary | ICD-10-CM

## 2021-07-07 ENCOUNTER — Ambulatory Visit: Payer: Medicaid Other | Admitting: Family Medicine

## 2021-07-07 NOTE — Progress Notes (Deleted)
    SUBJECTIVE:   CHIEF COMPLAINT / HPI:   [facility] 05/05/21 Lower extremity swelling and pain - Patient previously diagnosed with chronic venous insufficiency - Last seen for lower extremity swelling and pain April 2022 by Dr. Maudie Mercury - In past, DVT ultrasound negative - Today, patient reports chronic swelling that is unchanged - Denies unilateral leg redness or pain, does not believe 1 leg is more large than the other, denies claudication symptoms with activity - Was previously told to wear compression socks, however she does wear them only at nighttime and not during the day Numbness and tingling of foot Chronic.  Likely multifactorial and due to concurrent diabetes and chronic venous insufficiency.  Start gabapentin 100 mg nightly, increase to twice daily dosing.  Return precautions given, see AVS for more.  [facility] 05/28/20 Chronic venous insufficiency Attempted for ABI today, however, patient did not tolerate.  She denies any claudication pain with exercise (which she has been doing more with a recent 16 pound weight loss).  Discontinued amlodipine and started losartan 25 mg for high blood pressure.  Patient should follow-up in 3 to 5 days after starting losartan for lab check.  We will follow-up with her in a month for blood pressure check and check in for venous insufficiency.  Return precautions provided.  Also provided patient information about chronic venous insufficiency, leg elevation, compression stockings.  [facility] 03/04/20 Leg swelling/Venous insufficiency  Patient checking in today for leg swelling. She has chronic leg swelling that she likes to check in every few months for. She reports using compression stockings at night only, but not at work. She thinks she is taking lasix once daily. She reports hitting her right leg at work a few months ago and sustaining an injury. She reports this scabbed over, but has not yet healed. She reports full sensation in her legs. No fevers, chills, n/v. No  unilateral swelling/erythema. She has also been treated for cellulitis in her right leg, but reports that this injury is a separate event and that the cellulitis completely resolved.  Venous ulcer of right leg (HCC) 2+ Dps, patient did not tolerate ABI due to pain.  Unna boot placed today.  - Counseled patient about need for weekly visits and serious complications of venous ulcers.  - Reviewed treatment for chronic venous stasis as well as venous ulcer.  - weekly unna boot placement  - follow up with physician in one month for check in on progress  - return precautions provided  - handout provided   PERTINENT  PMH / PSH: ***  OBJECTIVE:   There were no vitals taken for this visit.  ***  ASSESSMENT/PLAN:   No problem-specific Assessment & Plan notes found for this encounter.     Ezequiel Essex, MD [place]

## 2021-08-03 ENCOUNTER — Other Ambulatory Visit: Payer: Self-pay

## 2021-08-03 ENCOUNTER — Ambulatory Visit (INDEPENDENT_AMBULATORY_CARE_PROVIDER_SITE_OTHER): Payer: Self-pay | Admitting: Family Medicine

## 2021-08-03 ENCOUNTER — Encounter: Payer: Self-pay | Admitting: Family Medicine

## 2021-08-03 VITALS — BP 147/86 | HR 70

## 2021-08-03 DIAGNOSIS — L97919 Non-pressure chronic ulcer of unspecified part of right lower leg with unspecified severity: Secondary | ICD-10-CM

## 2021-08-03 DIAGNOSIS — I872 Venous insufficiency (chronic) (peripheral): Secondary | ICD-10-CM

## 2021-08-03 DIAGNOSIS — I10 Essential (primary) hypertension: Secondary | ICD-10-CM

## 2021-08-03 DIAGNOSIS — I83019 Varicose veins of right lower extremity with ulcer of unspecified site: Secondary | ICD-10-CM

## 2021-08-03 NOTE — Progress Notes (Signed)
    SUBJECTIVE:   CHIEF COMPLAINT / HPI:   Leg rash -Desires cream for rash -States her legs have been this way for a long time -Denies fever, pruritis, drainage, or pain  -Has not been seen by wound care or podiatry  Hypertension - Medications: Losartan 100 mg daily, lasix 40 mg daily - Compliance: Yes - Checking BP at home: Unknown - Denies any SOB, CP, vision changes, medication SEs, or symptoms of hypotension  PERTINENT  PMH / PSH: HLD, neuropathy  OBJECTIVE:   BP (!) 147/86   Pulse 70   SpO2 95%   General: Appears well, no acute distress. Age appropriate. Cardiac: RRR, normal heart sounds, no murmurs Respiratory: CTAB, normal effort Extremities: Hyperpigmented extremely dry to leathery appearing skin. Evidence of chronic ulcer on right leg. Several large calluses on the feet bilaterally. Able to appreciate normal pedal pulses.  Skin: As above. Neuro: alert and oriented, no focal deficits Psych: normal affect   Media Information   Document Information  Photos  Right foot  08/03/2021 11:14  Attached To:  Office Visit on 08/03/21 with Autry-Lott, Randa Evens, DO   Source Information  Autry-Lott, [facility] Med Resident     Media Information   Document Information  Photos  Left foot  08/03/2021 11:15  Attached To:  Office Visit on 08/03/21 with Autry-Lott, Randa Evens, DO   Source Information  Autry-Lott, Max Sane Med Resident    Media Information   Document Information  Photos  Right leg  08/03/2021 11:15  Attached To:  Office Visit on 08/03/21 with Autry-Lott, Randa Evens, DO   Source Information  Autry-Lott, Randa Evens, [place]  [facility] Resident    ASSESSMENT/PLAN:   Chronic venous insufficiency  Venous ulcer of right leg (HCC Good pedal pulses. Could benefit from wound care nursing. Several calluses and extreme dryness. Refer to podiatry and discussed emollients several times a day.  - AMB referral to wound care center -  Ambulatory referral to Podiatry  Primary hypertension Elevated BP reading. The patient refused repeat in office today.  -Continue current medications -Follow up in 2 week for BP recheck  [name], DO [facility]

## 2021-09-08 NOTE — Progress Notes (Deleted)
The Pharmacy team is conducting a quality improvement initiative. The recommendation below is for your consideration for this patient who has an appointment with you on 09/09/2021.   This patient has Hypertension (most recent BP was > 140/90) and eGFR < 90.   Current antihypertensive medications: Cozaar (losartan), Lasix (Furosemide)  Current renal protective medications: Cozaar (losartan)   Last eGFR: 82  Last UACR: greater than 1 year ago   If initial BP assessment remains greater than your target BP goal and the repeated blood pressure after the patient has been quiet and resting for 5 minutes remains elevated, determine if medication access or adherence issues are potential cause for suboptimal control. If appropriate:  - Consider collection of annual UACR  - Consider addition of amlodipine   - As patient has been resistant to additional medications in the past, consider stopping Lasix in favor of amlodipine for improved BP control  - Consider SGLT2 initiation for CKD treatment  - Consider pharmacy clinic referral for follow-up of elevated blood pressure / medication review

## 2021-09-09 ENCOUNTER — Ambulatory Visit: Payer: Medicaid Other | Admitting: Family Medicine

## 2021-10-01 ENCOUNTER — Ambulatory Visit: Payer: Medicaid Other | Admitting: Family Medicine

## 2021-10-08 NOTE — Progress Notes (Deleted)
Hello Dr. Jeani Hawking,     The Pharmacy team is conducting a quality improvement initiative. The recommendation below is for your consideration for this patient who has an appointment with you on 10/09/2021.    This patient has Hypertension (most recent BP was > 140/90) and eGFR < 90.   Current antihypertensive medications: losartan 100 mg  Current renal protective medications: losartan 100 mg   Last eGFR: 79  Last UACR: greater than 1 year ago   If initial BP assessment remains greater than your target BP goal and the repeated blood pressure after the patient has been quiet and resting for 5 minutes remains elevated, determine if medication access or adherence issues are potential cause for suboptimal control. If appropriate:  - Consider collection of annual UACR  - Consider addition of amlodipine 5 mg  - Consider SGLT2 initiation for CKD treatment  - Consider pharmacy clinic referral for follow-up of elevated blood pressure / medication review   Thanks!  Martina Sinner

## 2021-10-09 ENCOUNTER — Ambulatory Visit: Payer: Medicaid Other | Admitting: Family Medicine

## 2021-10-16 ENCOUNTER — Other Ambulatory Visit: Payer: Self-pay | Admitting: Family Medicine

## 2021-10-16 DIAGNOSIS — I10 Essential (primary) hypertension: Secondary | ICD-10-CM

## 2021-10-23 ENCOUNTER — Ambulatory Visit: Payer: Medicaid Other | Admitting: Family Medicine

## 2021-11-16 ENCOUNTER — Telehealth: Payer: Self-pay

## 2021-11-16 NOTE — Telephone Encounter (Signed)
Patient calls nurse line requesting PCP recommendation on max Tylenol dosage.   Patient reports she has been taking 1,000mg  once per day for leg pain. However, she reports this dose is not lasting her.   Will forward to PCP for recommendation.

## 2021-11-19 NOTE — Telephone Encounter (Signed)
Pt has been informed. Pt understands dosing. Ottis Stain, CMA

## 2021-12-09 NOTE — Progress Notes (Unsigned)
SUBJECTIVE:   CHIEF COMPLAINT / HPI:   Ms. Soave is here today for discussion of a leg complaint. We also discussed her bilateral foot pain, weight loss efforts, and blood pressure.   Chronic venous insufficiency Patient previously diagnosed with chronic venous insufficiency of bilateral legs.  She bears the woody stigmata of this diagnosis.  She was referred to podiatry over the summer, but prefers to not go to any specialist.  She wanted to see if she can get a cream to help with the rough dry skin on her legs.  Bilateral foot pain Ms. Haber reports bilateral longitudinal and transverse arch pain, worse with walking.  Her shoes currently do not have any arch support and bear signs of extended wear.  No known injuries.  Diabetes well controlled, she actually lives in the prediabetic range mostly.  Last A1c 6.5 in March.  Weight loss efforts Patient reports that she has made great efforts to lose weight.  She has cut out a lot of sugar, only eating it now twice weekly.  She is try to incorporate much more healthy food into her diet and she is walking a lot more than previously.  She also walks up and down long halls at work.  She is quite pleased with her success.  Our chart records indicate she is down about 20 pounds, as she was recorded at 243.4 pounds on 05/05/2021.  Now 223 pounds today.  Hypertension Current medications include losartan 100 mg and furosemide 40 mg.  Patient reports religious adherence to these medications.  No recent missed doses.  She is tolerating his medications well without adverse side effects.  No orthostatic hypotension or presyncope.  Is not checking blood pressure at home.  PERTINENT  PMH / PSH:  Patient Active Problem List   Diagnosis Date Noted   Weight loss 12/12/2021   Flat foot 12/12/2021   Numbness and tingling of foot 05/29/2021   Diabetes mellitus without complication (HCC) 03/18/2020   Primary osteoarthritis of left knee 03/06/2020    Venous ulcer of right leg (HCC) 03/06/2020   BMI 40.0-44.9, adult (HCC) 06/18/2019   Encounter for screening mammogram for malignant neoplasm of breast 06/18/2019   Chronic venous insufficiency 11/20/2018   Nocturnal leg cramps 03/14/2017   Hyperlipidemia 08/18/2012   HTN (hypertension) 08/18/2012    OBJECTIVE:   BP (!) 144/90   Pulse 72   Wt 223 lb 6.4 oz (101.3 kg)   SpO2 95%   BMI 36.06 kg/m    General: Awake, alert, no acute distress Respiratory: Speaking clearly in full sentences, no respiratory distress, normal work of breathing Extremities: Thickened dimpled woody appearance of bilateral lower extremities from mid shin to ankle with associated overlying scale and dry skin.  One round scaly patch over anterior right distal shin.  Legs without erythema or excess warmth.  Bilateral feet demonstrate dry peeling skin on plantar surfaces with collapse of transverse and longitudinal arches bilaterally.  ASSESSMENT/PLAN:   Chronic venous insufficiency Patient desires cream for legs, does not want to pursue podiatry or vascular care at this time.  Discussed using thick emollient such as Eucerin or Aquaphor after showers.  If this does not provide sufficient relief from scaling skin, can consider keratolytic cream such as lactic acid or urea agent.  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  Current issues today demonstrate excessive wear and no arch support.  Patient quite interested in good brand of shoes and shoe inserts.  Discussed best  shoe brands, but ultimately recommended arch support and wide toe box.  Provided information on Hapad brand insoles with arch support or similar.  Patient does not want to pursue custom orthotics at this time, we will see if store-bought inserts provide pain relief.  Weight loss Doing quite well with this on her own.  She has lost about 20 pounds since we measured her in March, 6 months ago.  Congratulated and encouraged  patient to continue incorporation of healthy eating habits and increase physical activity into her daily habits.  HTN (hypertension) Elevated, 140s/90s today even on recheck.  Patient reports religious adherence to her losartan and furosemide.  Given that patient's Medicaid is pending and she currently only has family-planning Medicaid, will not change any medications right now.  I will have patient keep a blood pressure log for the next 2 or so weeks to see if her pressures are this elevated at home.  If they are, we could consider a simpler combination pill such as losartan-HCTZ for better blood pressure control and reduced pill burden.  Encounter for screening mammogram for malignant neoplasm of breast Patient due for mammogram.  Order placed, provided patient with instructions for scheduling with [facility] imaging breast center.  Diabetes mellitus without complication ([place]) Refer to ophthalmology for routine diabetic eye exam.     Ezequiel Essex, MD [place]

## 2021-12-11 ENCOUNTER — Ambulatory Visit (INDEPENDENT_AMBULATORY_CARE_PROVIDER_SITE_OTHER): Payer: Self-pay | Admitting: Family Medicine

## 2021-12-11 VITALS — BP 144/90 | HR 72 | Wt 223.4 lb

## 2021-12-11 DIAGNOSIS — R634 Abnormal weight loss: Secondary | ICD-10-CM

## 2021-12-11 DIAGNOSIS — I872 Venous insufficiency (chronic) (peripheral): Secondary | ICD-10-CM

## 2021-12-11 DIAGNOSIS — E119 Type 2 diabetes mellitus without complications: Secondary | ICD-10-CM

## 2021-12-11 DIAGNOSIS — M2142 Flat foot [pes planus] (acquired), left foot: Secondary | ICD-10-CM

## 2021-12-11 DIAGNOSIS — I10 Essential (primary) hypertension: Secondary | ICD-10-CM

## 2021-12-11 DIAGNOSIS — M2141 Flat foot [pes planus] (acquired), right foot: Secondary | ICD-10-CM

## 2021-12-11 DIAGNOSIS — Z1231 Encounter for screening mammogram for malignant neoplasm of breast: Secondary | ICD-10-CM

## 2021-12-11 NOTE — Patient Instructions (Addendum)
It was wonderful to see you today. Thank you for allowing me to be a part of your care. Below is a short summary of what we discussed at your visit today:  Lotion for legs Try to find Aquaphor or Eucerin lotions.  These are quite thick and will trap moisture.  Right after the bath or shower, pat dry your legs and apply this lotion.  Our goal for water intake is 6-8 bottles of water in a day.  This can be spread out, does not have to be all at once.  Try to increase your water intake from 2 bottles to 4 bottles a day.  Shoe insert for support Your feet should feel better with some arch support.  Try to find the arch supports called Hapad. Below is a picture.  https://hapad.com/ Alternatively, you could find an arch support on [place] or other places online.    Blood pressure Please keep a blood pressure log. Check your blood pressure at home with a home cuff or at the grocery store or pharmacy. Keep a log over the next couple of weeks. I want to know if your blood pressure is high at home like it is in the clinic. Please send me a photo or MyChart message with the blood pressures typed out.   This log will help [place] to determine if we should increase or change your medicine.   Mammogram I have ordered your routine mammogram to screen for breast cancer. This will be at the [facility]. You will call them directly to make an appointment at your convenience. Information below.     I have referred you to ophthalmology for a routine diabetic eye exam.  Someone from their office should be calling you in 1 to 2 weeks to schedule an appointment.  If you do not hear from them, let us know. We may need to nudge along the referral.    Please bring all of your medications to every appointment!  If you have any questions or concerns, please do not hesitate to contact us via phone or MyChart message.   Ezequiel Essex, MD

## 2021-12-12 DIAGNOSIS — M2140 Flat foot [pes planus] (acquired), unspecified foot: Secondary | ICD-10-CM | POA: Insufficient documentation

## 2021-12-12 DIAGNOSIS — R634 Abnormal weight loss: Secondary | ICD-10-CM | POA: Insufficient documentation

## 2021-12-12 NOTE — Assessment & Plan Note (Signed)
Doing quite well with this on her own.  She has lost about 20 pounds since we measured her in March, 6 months ago.  Congratulated and encouraged patient to continue incorporation of healthy eating habits and increase physical activity into her daily habits.

## 2021-12-12 NOTE — Assessment & Plan Note (Signed)
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  Current issues today demonstrate excessive wear and no arch support.  Patient quite interested in good brand of shoes and shoe inserts.  Discussed best shoe brands, but ultimately recommended arch support and wide toe box.  Provided information on Hapad brand insoles with arch support or similar.  Patient does not want to pursue custom orthotics at this time, we will see if store-bought inserts provide pain relief.

## 2021-12-12 NOTE — Assessment & Plan Note (Signed)
Patient due for mammogram.  Order placed, provided patient with instructions for scheduling with [facility] imaging breast center.

## 2021-12-12 NOTE — Assessment & Plan Note (Signed)
Elevated, 140s/90s today even on recheck.  Patient reports religious adherence to her losartan and furosemide.  Given that patient's Medicaid is pending and she currently only has family-planning Medicaid, will not change any medications right now.  I will have patient keep a blood pressure log for the next 2 or so weeks to see if her pressures are this elevated at home.  If they are, we could consider a simpler combination pill such as losartan-HCTZ for better blood pressure control and reduced pill burden.

## 2021-12-12 NOTE — Assessment & Plan Note (Signed)
Refer to ophthalmology for routine diabetic eye exam.

## 2021-12-12 NOTE — Assessment & Plan Note (Signed)
Patient desires cream for legs, does not want to pursue podiatry or vascular care at this time.  Discussed using thick emollient such as Eucerin or Aquaphor after showers.  If this does not provide sufficient relief from scaling skin, can consider keratolytic cream such as lactic acid or urea agent.

## 2021-12-28 ENCOUNTER — Telehealth: Payer: Self-pay

## 2021-12-28 DIAGNOSIS — R200 Anesthesia of skin: Secondary | ICD-10-CM

## 2021-12-28 DIAGNOSIS — E119 Type 2 diabetes mellitus without complications: Secondary | ICD-10-CM

## 2021-12-28 NOTE — Telephone Encounter (Signed)
Patient calls nurse line requesting alternative to gabapentin.   Patient reports that the gabapentin causes her to have a dry mouth.   Please advise.   Veronda Prude, RN

## 2021-12-29 ENCOUNTER — Other Ambulatory Visit: Payer: Self-pay | Admitting: Family Medicine

## 2021-12-29 DIAGNOSIS — R200 Anesthesia of skin: Secondary | ICD-10-CM

## 2021-12-29 MED ORDER — DULOXETINE HCL 60 MG PO CPEP: 60.0000 mg | ORAL_CAPSULE | Freq: Every day | ORAL | 3 refills | Status: DC

## 2021-12-29 NOTE — Telephone Encounter (Signed)
Patient contacted and advised to stop Gabapentin and start Duloxetine.   I stressed the importance of taking this medication daily.   Attempted to schedule in early December, however patient reported she would call back after she looks at her schedule.

## 2021-12-29 NOTE — Telephone Encounter (Signed)
Patient is prescribed gabapentin for presumed diabetic neuropathy. Diabetes well controlled. Patient requesting alternative to gabapentin. We can try duloxetine instead, however patient will need to take this every day.   Stop gabapentin.   Start duloxetine 60 mg once daily.   Patient will need to return for appointment to follow up on tolerance and diabetes in 2-3 weeks. Please book either in person or virtual appointment (although in person preferred).   Fayette Pho, MD

## 2022-01-12 ENCOUNTER — Telehealth: Payer: Self-pay

## 2022-01-12 NOTE — Telephone Encounter (Signed)
Patient calls nurse line in regards to Metformin.   She reports she took her last pill today, however can not pick up refill until Friday. She reports Friday is payday.   Patient would like to make sure missing 2 doses will be ok.   Advised will forward to PCP.

## 2022-01-13 NOTE — Telephone Encounter (Signed)
Attempted to call patient, however no answer and mailbox full.

## 2022-01-24 ENCOUNTER — Other Ambulatory Visit: Payer: Self-pay | Admitting: Family Medicine

## 2022-01-24 DIAGNOSIS — R202 Paresthesia of skin: Secondary | ICD-10-CM

## 2022-02-03 ENCOUNTER — Other Ambulatory Visit: Payer: Self-pay | Admitting: Family Medicine

## 2022-02-03 DIAGNOSIS — I10 Essential (primary) hypertension: Secondary | ICD-10-CM

## 2022-03-31 ENCOUNTER — Other Ambulatory Visit: Payer: Self-pay | Admitting: Family Medicine

## 2022-03-31 DIAGNOSIS — I10 Essential (primary) hypertension: Secondary | ICD-10-CM

## 2022-04-02 ENCOUNTER — Other Ambulatory Visit: Payer: Self-pay | Admitting: Family Medicine

## 2022-04-02 DIAGNOSIS — R200 Anesthesia of skin: Secondary | ICD-10-CM

## 2022-04-02 DIAGNOSIS — E119 Type 2 diabetes mellitus without complications: Secondary | ICD-10-CM

## 2022-04-30 ENCOUNTER — Other Ambulatory Visit: Payer: Self-pay | Admitting: Family Medicine

## 2022-04-30 DIAGNOSIS — R200 Anesthesia of skin: Secondary | ICD-10-CM

## 2022-06-11 ENCOUNTER — Ambulatory Visit: Payer: Medicaid Other | Admitting: Family Medicine

## 2022-06-15 ENCOUNTER — Ambulatory Visit: Payer: Medicaid Other | Admitting: Family Medicine

## 2022-06-15 ENCOUNTER — Telehealth: Payer: Self-pay | Admitting: Family Medicine

## 2022-06-15 DIAGNOSIS — Z23 Encounter for immunization: Secondary | ICD-10-CM

## 2022-06-15 DIAGNOSIS — E119 Type 2 diabetes mellitus without complications: Secondary | ICD-10-CM

## 2022-06-15 NOTE — Telephone Encounter (Signed)
Patient no-showed our 3:50 pm appointment today, 5/07. Of note, this is the third reschedule of the same complaint on my own schedule.   Other no shows to [facility] below: 06/11/2022 10/01/2021 09/09/2021 04/23/2021 03/11/2021 03/02/2021  I, the PCP, attempted phone call to patient. "Call cannot be completed as dialed". No alternate number on file.   I will forward this note to [facility] No show pool for monitoring.   Fayette Pho, MD

## 2022-07-06 ENCOUNTER — Telehealth: Payer: Self-pay

## 2022-07-06 ENCOUNTER — Other Ambulatory Visit: Payer: Self-pay

## 2022-07-06 NOTE — Progress Notes (Signed)
   Sarah Macdonald Jun 08, 1965 [id number]  Patient outreached by Buddy Duty , PharmD Candidate on 07/06/2022.  Blood Pressure Readings: Last documented ambulatory systolic blood pressure: 144 Last documented ambulatory diastolic blood pressure: 90 Does the patient have a validated home blood pressure machine?: No   Medication review was performed. Is the patient taking their medications as prescribed?: Yes Pt uses OTC Voltaren Gel, but continues to have joint pain.  The following barriers to adherence were noted: Does the patient have cost concerns?: No Does the patient have transportation concerns?: No Does the patient occassionally forget to take some of their prescribed medications?: No Does the patient feel like one/some of their medications make them feel poorly?: No Does the patient have questions or concerns about their medications?: No Does the patient have a follow up scheduled with their primary care provider/cardiologist?: Yes   Interventions: Interventions Completed: Medications were reviewed  The patient has follow up scheduled:  PCP: Fayette Pho, MD   Buddy Duty, Student-PharmD

## 2022-07-06 NOTE — Progress Notes (Signed)
Patient attempted to be outreached by Buddy Duty, PharmD Candidate on 07/06/22 to discuss hypertension. Left voicemail for patient to return our call at their convenience at [phone].  Maudell Stanbrough, Student-PharmD

## 2022-07-07 ENCOUNTER — Other Ambulatory Visit: Payer: Self-pay | Admitting: Family Medicine

## 2022-07-07 ENCOUNTER — Ambulatory Visit: Payer: Medicaid Other | Admitting: Family Medicine

## 2022-07-07 DIAGNOSIS — I10 Essential (primary) hypertension: Secondary | ICD-10-CM

## 2022-07-09 ENCOUNTER — Ambulatory Visit: Payer: Medicaid Other | Admitting: Family Medicine

## 2022-07-13 ENCOUNTER — Ambulatory Visit: Payer: Medicaid Other | Admitting: Family Medicine

## 2022-07-19 ENCOUNTER — Ambulatory Visit: Payer: Medicaid Other | Admitting: Family Medicine

## 2022-07-26 ENCOUNTER — Ambulatory Visit: Payer: Medicaid Other | Admitting: Family Medicine

## 2022-07-30 ENCOUNTER — Encounter: Payer: Self-pay | Admitting: Family Medicine

## 2022-07-30 ENCOUNTER — Ambulatory Visit: Payer: Medicaid Other | Admitting: Family Medicine

## 2022-07-30 VITALS — BP 138/88 | HR 72 | Ht 66.0 in | Wt 214.0 lb

## 2022-07-30 DIAGNOSIS — M1712 Unilateral primary osteoarthritis, left knee: Secondary | ICD-10-CM

## 2022-07-30 DIAGNOSIS — R634 Abnormal weight loss: Secondary | ICD-10-CM

## 2022-07-30 DIAGNOSIS — E119 Type 2 diabetes mellitus without complications: Secondary | ICD-10-CM | POA: Diagnosis not present

## 2022-07-30 DIAGNOSIS — I872 Venous insufficiency (chronic) (peripheral): Secondary | ICD-10-CM | POA: Diagnosis not present

## 2022-07-30 DIAGNOSIS — I10 Essential (primary) hypertension: Secondary | ICD-10-CM | POA: Diagnosis not present

## 2022-07-30 DIAGNOSIS — Z23 Encounter for immunization: Secondary | ICD-10-CM

## 2022-07-30 DIAGNOSIS — E785 Hyperlipidemia, unspecified: Secondary | ICD-10-CM

## 2022-07-30 MED ORDER — ZOSTER VAC RECOMB ADJUVANTED 50 MCG/0.5ML IM SUSR
0.5000 mL | INTRAMUSCULAR | 0 refills | Status: AC
Start: 2022-07-30 — End: 2022-08-30

## 2022-07-30 MED ORDER — UREA 25 % EX LOTN
TOPICAL_LOTION | CUTANEOUS | 3 refills | Status: DC
Start: 2022-07-30 — End: 2022-08-11

## 2022-07-30 MED ORDER — OMEGA-3-ACID ETHYL ESTERS 1 G PO CAPS
2.0000 g | ORAL_CAPSULE | Freq: Two times a day (BID) | ORAL | 11 refills | Status: DC
Start: 2022-07-30 — End: 2022-10-01

## 2022-07-30 NOTE — Progress Notes (Unsigned)
    SUBJECTIVE:   CHIEF COMPLAINT / HPI:   Lesg 12/11/21 "Chronic venous insufficiency Patient desires cream for legs, does not want to pursue podiatry or vascular care at this time.  Discussed using thick emollient such as Eucerin or Aquaphor after showers.  If this does not provide sufficient relief from scaling skin, can consider keratolytic cream such as lactic acid or urea agent."    Joints and bones pain Hurt when she's on her feet  Cream for legs - keratinolytic   PERTINENT  PMH / PSH: ***  OBJECTIVE:   BP 138/88   Pulse 72   Ht 5\' 6"  (1.676 m)   Wt 214 lb (97.1 kg)   SpO2 97%   BMI 34.54 kg/m   ***  ASSESSMENT/PLAN:   No problem-specific Assessment & Plan notes found for this encounter.     Fayette Pho, MD [facility]

## 2022-07-30 NOTE — Patient Instructions (Addendum)
It was wonderful to see you today. Thank you for allowing me to be a part of your care. Below is a short summary of what we discussed at your visit today:  Skin concerns I have prescribed urea cream.  This will help break down some of the thick flaking on the skin.  Apply it twice a day to affected areas. Over top, apply a good thick lotion over the areas. It can be Eucerin, Aquaphor, Vaseline. Come back in 1-2 months with our dermatology or skin clinic to check in on your legs.   Labs today If the results are normal, I will send you a letter or MyChart message. If the results are abnormal, I will give you a call.    Joint health, cholesterol  Start the omega 3 capsules. Sent to your pharmacy.   PAP smear You are due for Pap smear.  This can be its own appointment.  Please schedule at your convenience.  Shingles Vaccine I have written a prescription for the shingles vaccine.  Please take this to your pharmacy to have this administered. Your insurance prefers you get this specific vaccine at the pharmacy instead of in clinic.   Primary care doctor graduating soon! I am graduating June 28th. You will be assigned a new PCP.  Thank you for letting me care for you! It has been wonderful getting to know you.   Please bring all of your medications to every appointment!  If you have any questions or concerns, please do not hesitate to contact us via phone or MyChart message.   Fayette Pho, MD

## 2022-07-31 LAB — BASIC METABOLIC PANEL
BUN/Creatinine Ratio: 9 (ref 9–23)
BUN: 10 mg/dL (ref 6–24)
CO2: 24 mmol/L (ref 20–29)
Calcium: 9.4 mg/dL (ref 8.7–10.2)
Chloride: 102 mmol/L (ref 96–106)
Creatinine, Ser: 1.16 mg/dL — ABNORMAL HIGH (ref 0.57–1.00)
Glucose: 100 mg/dL — ABNORMAL HIGH (ref 70–99)
Potassium: 4 mmol/L (ref 3.5–5.2)
Sodium: 143 mmol/L (ref 134–144)
eGFR: 55 mL/min/{1.73_m2} — ABNORMAL LOW (ref >59)

## 2022-07-31 LAB — LIPID PANEL
Chol/HDL Ratio: 3 ratio (ref 0.0–4.4)
Cholesterol, Total: 159 mg/dL (ref 100–199)
HDL: 53 mg/dL (ref >39)
LDL Chol Calc (NIH): 94 mg/dL (ref 0–99)
Triglycerides: 58 mg/dL (ref 0–149)
VLDL Cholesterol Cal: 12 mg/dL (ref 5–40)

## 2022-07-31 LAB — HEMOGLOBIN A1C
Est. average glucose Bld gHb Est-mCnc: 151 mg/dL
Hgb A1c MFr Bld: 6.9 % — ABNORMAL HIGH (ref 4.8–5.6)

## 2022-07-31 LAB — MICROALBUMIN / CREATININE URINE RATIO
Creatinine, Urine: 105.4 mg/dL
Microalb/Creat Ratio: 502 mg/g creat — ABNORMAL HIGH (ref 0–29)
Microalbumin, Urine: 529.4 ug/mL

## 2022-08-01 NOTE — Assessment & Plan Note (Signed)
Lipid panel today with other blood work. Given that patient desires an omega-3 supplement for her joints, we discussed briefly using a prescription omega-3 that could also help with cholesterol. Patient amenable, rx sent.

## 2022-08-01 NOTE — Assessment & Plan Note (Signed)
Continued joint and bone pain. Discussed good renal function, so she may alternate tylenol and ibuprofen. Encouraged voltaren gel on knees. Patient interested in omega 3 supplement OTC for her joints - discussed that it may help but that there is more evidence of omega-3 helping cardiovascular health than joint pain. Patient interested for both joints and cholesterol. Will order.

## 2022-08-01 NOTE — Assessment & Plan Note (Signed)
At goal today. No changes to regimen. BMP today for creatinine and potassium.

## 2022-08-01 NOTE — Assessment & Plan Note (Signed)
Chronic, stable. Bilateral shins bear the woody stigmata of chronic venous insufficiency. No signs of poorly healing wounds or active infection. Start urea cream BID, recommended a thick emollient such as Aquaphor or Eucerin afterwards. Recommend follow up with our skin or derm clinic in about a month to check progress and make adjustments to regimen.

## 2022-08-01 NOTE — Assessment & Plan Note (Addendum)
A1c today with other blood work to assess. Will collect urine as well for microalbumin assessment.

## 2022-08-01 NOTE — Assessment & Plan Note (Signed)
Continues to lose weight, down 10 pounds from last appointment. Down about 30 pounds from a year ago. Congratulated patient on her success and encouraged her to continue with healthy eating choices.

## 2022-08-02 ENCOUNTER — Encounter: Payer: Self-pay | Admitting: Family Medicine

## 2022-08-02 ENCOUNTER — Telehealth: Payer: Self-pay | Admitting: Family Medicine

## 2022-08-02 DIAGNOSIS — E1129 Type 2 diabetes mellitus with other diabetic kidney complication: Secondary | ICD-10-CM

## 2022-08-02 DIAGNOSIS — E119 Type 2 diabetes mellitus without complications: Secondary | ICD-10-CM

## 2022-08-02 DIAGNOSIS — E785 Hyperlipidemia, unspecified: Secondary | ICD-10-CM

## 2022-08-02 MED ORDER — EMPAGLIFLOZIN-METFORMIN HCL 12.5-1000 MG PO TABS
1.0000 | ORAL_TABLET | Freq: Every day | ORAL | 3 refills | Status: DC
Start: 2022-08-02 — End: 2022-10-01

## 2022-08-02 MED ORDER — ROSUVASTATIN CALCIUM 40 MG PO TABS
40.0000 mg | ORAL_TABLET | Freq: Every day | ORAL | 3 refills | Status: DC
Start: 2022-08-02 — End: 2023-04-12

## 2022-08-02 NOTE — Telephone Encounter (Signed)
Called patient to discuss labs.   Severely elevated microalbuminuria present. A1c increased from last measurement. LDL above goal of <70 on rosuvastatin 20 mg.  Plan:  - swap metformin monotherapy for metformin-SGLT2 combo - increase rosuvastatin from 20 to 40 mg - recheck LDL and A1c in 3 months  Patient amenable to all the above. All questions answered.   Fayette Pho, MD  The 10-year ASCVD risk score (Arnett DK, et al., 2019) is: 13.7%   Values used to calculate the score:     Age: 57 years     Sex: Female     Is Non-Hispanic African American: Yes     Diabetic: Yes     Tobacco smoker: No     Systolic Blood Pressure: 138 mmHg     Is BP treated: Yes     HDL Cholesterol: 53 mg/dL     Total Cholesterol: 159 mg/dL

## 2022-08-09 ENCOUNTER — Telehealth: Payer: Self-pay

## 2022-08-09 DIAGNOSIS — I872 Venous insufficiency (chronic) (peripheral): Secondary | ICD-10-CM

## 2022-08-09 NOTE — Telephone Encounter (Signed)
Patient calls nurse line regarding omega 3 capsules. She states that these capsules are "too big and are not staying down."   She is requesting an alternative for a smaller medication.   Please advise.   Veronda Prude, RN

## 2022-08-11 ENCOUNTER — Other Ambulatory Visit: Payer: Self-pay | Admitting: Family Medicine

## 2022-08-11 DIAGNOSIS — I10 Essential (primary) hypertension: Secondary | ICD-10-CM

## 2022-08-11 DIAGNOSIS — E119 Type 2 diabetes mellitus without complications: Secondary | ICD-10-CM

## 2022-08-11 DIAGNOSIS — R200 Anesthesia of skin: Secondary | ICD-10-CM

## 2022-08-11 MED ORDER — UREA 10 % EX OINT
1.0000 | TOPICAL_OINTMENT | Freq: Every day | CUTANEOUS | 0 refills | Status: DC
Start: 2022-08-11 — End: 2022-08-30

## 2022-08-11 NOTE — Telephone Encounter (Signed)
Returned call to patient. Patient states that she will follow up with pharmacy for OTC recommendations.   She also reports that she was unable to get the urea emollient from [facility]. She was told that they do not stock this prescription.   Advised that I could call a compounding pharmacy for her, however, she declined as she states she has limited transportation.   She would prefer an alternative to be sent to her local [facility]. Please advise.   Veronda Prude, RN

## 2022-08-17 ENCOUNTER — Telehealth: Payer: Self-pay

## 2022-08-17 NOTE — Telephone Encounter (Signed)
Chart review completed for patient. Patient is due for screening mammogram. Patient declined to schedule at this time. Sarah Macdonald, Population Health Specialist.

## 2022-08-28 ENCOUNTER — Other Ambulatory Visit: Payer: Self-pay | Admitting: Student

## 2022-08-28 DIAGNOSIS — I872 Venous insufficiency (chronic) (peripheral): Secondary | ICD-10-CM

## 2022-10-01 ENCOUNTER — Ambulatory Visit: Payer: Medicaid Other | Admitting: Student

## 2022-10-01 ENCOUNTER — Encounter: Payer: Self-pay | Admitting: Student

## 2022-10-01 VITALS — BP 99/66 | HR 79 | Wt 196.2 lb

## 2022-10-01 DIAGNOSIS — I10 Essential (primary) hypertension: Secondary | ICD-10-CM | POA: Diagnosis not present

## 2022-10-01 DIAGNOSIS — M1712 Unilateral primary osteoarthritis, left knee: Secondary | ICD-10-CM

## 2022-10-01 DIAGNOSIS — Z7984 Long term (current) use of oral hypoglycemic drugs: Secondary | ICD-10-CM | POA: Diagnosis not present

## 2022-10-01 DIAGNOSIS — R634 Abnormal weight loss: Secondary | ICD-10-CM

## 2022-10-01 DIAGNOSIS — E119 Type 2 diabetes mellitus without complications: Secondary | ICD-10-CM

## 2022-10-01 MED ORDER — EMPAGLIFLOZIN 25 MG PO TABS
25.0000 mg | ORAL_TABLET | Freq: Every day | ORAL | 3 refills | Status: DC
Start: 2022-10-01 — End: 2023-04-12

## 2022-10-01 MED ORDER — DICLOFENAC SODIUM 1 % EX GEL
2.0000 g | Freq: Four times a day (QID) | CUTANEOUS | 0 refills | Status: DC
Start: 2022-10-01 — End: 2023-04-19

## 2022-10-01 NOTE — Patient Instructions (Addendum)
It was great to see you today!   I am changing your diabetes medication to Jardiance to try and help with your stomach pain. If you cannot afford this medication or you start having side effects please call the office and let me know.   You are due for a pap smear and Shingles vaccine. Please consider both of these.   For your leg pain continue using the Voltaren gel up to 4 times per day.   Avoid walking barefoot as this can cause an injury to your feet.     I would like to see you back in 3 months to recheck your blood sugar and diabetes.   Please arrive 15 minutes before your appointment to ensure smooth check in process.    Please call the clinic at [phone] if your symptoms worsen or you have any concerns.  Thank you for allowing me to participate in your care, Dr. Glendale Chard [facility]

## 2022-10-01 NOTE — Assessment & Plan Note (Signed)
DC empagliflozin-metformin  Start Jardiance 25 mg daily  Recheck A1c in 3 months

## 2022-10-01 NOTE — Progress Notes (Cosign Needed Addendum)
    SUBJECTIVE:   CHIEF COMPLAINT / HPI:   Sarah Macdonald is a 57 y.o. female  presenting for GI upset from her diabetic medication. She was recently started on jardiance-metformin combination on 07/2022. She reports doing well on the medication initially but then developed abdominal cramping, diarrhea and pain. She stopped taking the medication which resolved her symptoms. Her last A1c was 07/2022 at 6.9.   HTN: she is compliant on her blood pressure medication Losartan 100 mg. She denies dizziness, weakness, falls. She denies side effects from the medication.   PERTINENT  PMH / PSH: Reviewed and updated   OBJECTIVE:   BP 99/66 (BP Location: Right Arm, Patient Position: Sitting)   Pulse 79   Wt 196 lb 3.2 oz (89 kg)   BMI 31.67 kg/m   Well-appearing, no acute distress Cardio: Regular rate, regular rhythm, no murmurs on exam. Pulm: Clear, no wheezing, no crackles. No increased work of breathing Abdominal: bowel sounds present, soft, non-tender, non-distended Extremities: no peripheral edema, chronic lymph Neuro: alert and oriented x3, speech normal in content, no facial asymmetry, strength intact and equal bilaterally in UE and LE, pupils equal and reactive to light.  Psych:  Cognition and judgment appear intact. Alert, communicative  and cooperative with normal attention span and concentration. No apparent delusions, illusions, hallucinations      12/11/2021    4:24 PM 08/03/2021   10:45 AM 05/29/2021    3:03 PM  PHQ9 SCORE ONLY  PHQ-9 Total Score 0 0 0      ASSESSMENT/PLAN:   Diabetes mellitus without complication (HCC) DC empagliflozin-metformin  Start Jardiance 25 mg daily  Recheck A1c in 3 months   HTN (hypertension) BP soft today but no signs of orthostatic hypotension. Patient denies dizziness and falls. Congratulated patient on 20 lb intentional weight loss. More than likely will need to decrease Losartan to 50 mg at next visit.   Weight loss Continues to  intentionally lose weight with diet and exercise. Patient reports feeling better with continued weight loss. She has been referred to GI in 2020 but there is no documentation of colonoscopy completed.   At next visit:  - discuss colonoscopy  - order TSH - order CBC    Health Maintenance: - Diabetic foot exam completed today  - Discussed pap smear and Shingles vaccine - patient declined   Glendale Chard, DO [facility]

## 2022-10-01 NOTE — Assessment & Plan Note (Signed)
Continues to intentionally lose weight with diet and exercise. Patient reports feeling better with continued weight loss. She has been referred to GI in 2020 but there is no documentation of colonoscopy completed.   At next visit:  - discuss colonoscopy  - order TSH - order CBC

## 2022-10-01 NOTE — Assessment & Plan Note (Signed)
BP soft today but no signs of orthostatic hypotension. Patient denies dizziness and falls. Congratulated patient on 20 lb intentional weight loss. More than likely will need to decrease Losartan to 50 mg at next visit.

## 2022-10-14 ENCOUNTER — Telehealth: Payer: Self-pay

## 2022-10-14 NOTE — Telephone Encounter (Signed)
Patient calls nurse line in regards to [place].   She reports medicaid does not cover this prescription. She reports the cash price is "too high."   Advised will send to our pharmacy team for assistance.

## 2022-10-15 ENCOUNTER — Other Ambulatory Visit (HOSPITAL_COMMUNITY): Payer: Self-pay

## 2022-10-19 NOTE — Telephone Encounter (Signed)
Attempted to contact patient to discuss.   However, no answer.   VM left to return my call if she has been unable to pick up medication.

## 2022-11-12 ENCOUNTER — Other Ambulatory Visit: Payer: Self-pay

## 2022-11-12 DIAGNOSIS — I10 Essential (primary) hypertension: Secondary | ICD-10-CM

## 2022-11-12 MED ORDER — FUROSEMIDE 40 MG PO TABS
ORAL_TABLET | ORAL | 3 refills | Status: DC
Start: 2022-11-12 — End: 2022-11-19

## 2022-11-17 ENCOUNTER — Ambulatory Visit: Payer: Medicaid Other | Admitting: Student

## 2022-11-19 ENCOUNTER — Telehealth: Payer: Self-pay

## 2022-11-19 ENCOUNTER — Ambulatory Visit (INDEPENDENT_AMBULATORY_CARE_PROVIDER_SITE_OTHER): Payer: Medicaid Other | Admitting: Family Medicine

## 2022-11-19 VITALS — BP 120/75 | HR 79 | Ht 66.0 in | Wt 206.2 lb

## 2022-11-19 DIAGNOSIS — Z7984 Long term (current) use of oral hypoglycemic drugs: Secondary | ICD-10-CM

## 2022-11-19 DIAGNOSIS — R600 Localized edema: Secondary | ICD-10-CM

## 2022-11-19 DIAGNOSIS — I10 Essential (primary) hypertension: Secondary | ICD-10-CM | POA: Diagnosis not present

## 2022-11-19 DIAGNOSIS — R011 Cardiac murmur, unspecified: Secondary | ICD-10-CM

## 2022-11-19 DIAGNOSIS — E119 Type 2 diabetes mellitus without complications: Secondary | ICD-10-CM

## 2022-11-19 MED ORDER — FUROSEMIDE 40 MG PO TABS: ORAL_TABLET | ORAL | 3 refills | Status: DC

## 2022-11-19 MED ORDER — METFORMIN HCL ER 500 MG PO TB24
1000.0000 mg | ORAL_TABLET | Freq: Two times a day (BID) | ORAL | 3 refills | Status: DC
Start: 2022-11-19 — End: 2023-04-12

## 2022-11-19 NOTE — Progress Notes (Signed)
    SUBJECTIVE:   CHIEF COMPLAINT / HPI:   Swollen legs  Swelling started Sunday. She says she started having cramping in her feet. Legs get tired with walking. She gets plenty of exercise. No shortness of breath. No chest pain. Feels like her legs are swollen up to her hips. She is most concerned about her hips. Says this is abnormal for her, She reports she has been taking lasix 40 daily. Has not tried compression socks. She stands for long periods of time.   Patient said that medicaid did not pay for jardiance so she has not started it.   Denies blood per rectum or postmenopausal bleeding.   Diabetes  She says she asked pharmacy to send refill request for metformin, so has not been taking this. Does not have glucometer to check BG.   PERTINENT  PMH / PSH: HTN   OBJECTIVE:   BP 120/75   Pulse 79   Ht 5\' 6"  (1.676 m)   Wt 206 lb 3.2 oz (93.5 kg)   SpO2 90%   BMI 33.28 kg/m   General: well appearing, in no acute distress CV: RRR, radial pulses equal and palpable, very edematous in bilateral lower extremities 2+ pitting edema up to hips. No JVD.  Resp: Normal work of breathing on room air, CTAB without any crackles  Abd: Soft, non tender, non distended  Neuro: Alert & Oriented x 4   Gained 10 pounds in two months   ASSESSMENT/PLAN:   Assessment & Plan Bilateral lower extremity edema Patient with diagnosis of venous insufficiency and has been taking lasix 40 mg, with worsening symptoms. Concern for possible heart failure given worsening edema and history of HTN. Has never had an ECHO before. Reassured as she has no dyspnea or orthopnea. Cramping is most likely due to worsening edema. Could also be due to nephrotic syndrome though edema is only in lower extremities not upper. No blood loss, less likely edema though has not had colonoscopy.  - Continue HTN control  - Continue 40 lasix  - ECHO  - Start jardiance, send PA  - BNP, BMP, CBC, TSH, UA  - Follow up with PCP   Diabetes mellitus without complication (HCC) Patient with previous A1c 6.9. Would benefit from treatment.  - Ordered metformin and resent for PA for jardiance.  - Follow up with PCP      Lockie Mola, MD [facility]

## 2022-11-19 NOTE — Patient Instructions (Signed)
It was wonderful to see you today.  Please bring ALL of your medications with you to every visit.   Today we talked about:  Leg swelling - You have venous insufficiency causing fluid to stay in your legs. Use compression stockings and elevate your legs when you can. However, you also have a murmur when I listened to  your heart so we are going to get some labs and get an ultrasound of your heart.   Thank you for choosing [facility].   Please call [phone] with any questions about today's appointment.  Lockie Mola, MD  Family Medicine

## 2022-11-19 NOTE — Telephone Encounter (Signed)
Pharmacy Patient Advocate Encounter   Received notification from CoverMyMeds that prior authorization for JARDIANCE is required/requested.   PA required; PA submitted to [facility] via CoverMyMeds Key/confirmation #/EOC [id number]. Status is pending   _______________________________________  Pharmacy Patient Advocate Encounter  Received notification from [facility] Medicaid that Prior Authorization for JARDIANCE has been APPROVED from 11/18/22 to 11/19/23   PA #/Case ID/Reference #: [id number]

## 2022-11-20 LAB — COMPREHENSIVE METABOLIC PANEL
ALT: 28 [IU]/L (ref 0–32)
AST: 32 [IU]/L (ref 0–40)
Albumin: 3.8 g/dL (ref 3.8–4.9)
Alkaline Phosphatase: 220 [IU]/L — ABNORMAL HIGH (ref 44–121)
BUN/Creatinine Ratio: 10 (ref 9–23)
BUN: 14 mg/dL (ref 6–24)
Bilirubin Total: 1.1 mg/dL (ref 0.0–1.2)
CO2: 26 mmol/L (ref 20–29)
Calcium: 10 mg/dL (ref 8.7–10.2)
Chloride: 98 mmol/L (ref 96–106)
Creatinine, Ser: 1.4 mg/dL — ABNORMAL HIGH (ref 0.57–1.00)
Globulin, Total: 3.4 g/dL (ref 1.5–4.5)
Glucose: 104 mg/dL — ABNORMAL HIGH (ref 70–99)
Potassium: 4.5 mmol/L (ref 3.5–5.2)
Sodium: 139 mmol/L (ref 134–144)
Total Protein: 7.2 g/dL (ref 6.0–8.5)
eGFR: 44 mL/min/{1.73_m2} — ABNORMAL LOW (ref >59)

## 2022-11-20 LAB — CBC WITH DIFFERENTIAL/PLATELET
Basophils Absolute: 0 10*3/uL (ref 0.0–0.2)
Basos: 1 %
EOS (ABSOLUTE): 0.1 10*3/uL (ref 0.0–0.4)
Eos: 1 %
Hematocrit: 36.2 % (ref 34.0–46.6)
Hemoglobin: 11.4 g/dL (ref 11.1–15.9)
Immature Grans (Abs): 0 10*3/uL (ref 0.0–0.1)
Immature Granulocytes: 0 %
Lymphocytes Absolute: 1.2 10*3/uL (ref 0.7–3.1)
Lymphs: 32 %
MCH: 32.2 pg (ref 26.6–33.0)
MCHC: 31.5 g/dL (ref 31.5–35.7)
MCV: 102 fL — ABNORMAL HIGH (ref 79–97)
Monocytes Absolute: 0.4 10*3/uL (ref 0.1–0.9)
Monocytes: 10 %
Neutrophils Absolute: 2.2 10*3/uL (ref 1.4–7.0)
Neutrophils: 56 %
Platelets: 256 10*3/uL (ref 150–450)
RBC: 3.54 x10E6/uL — ABNORMAL LOW (ref 3.77–5.28)
RDW: 16.4 % — ABNORMAL HIGH (ref 11.7–15.4)
WBC: 3.9 10*3/uL (ref 3.4–10.8)

## 2022-11-20 LAB — TSH RFX ON ABNORMAL TO FREE T4: TSH: 2.6 u[IU]/mL (ref 0.450–4.500)

## 2022-11-20 LAB — BRAIN NATRIURETIC PEPTIDE: BNP: 458.7 pg/mL — ABNORMAL HIGH (ref 0.0–100.0)

## 2022-11-21 NOTE — Assessment & Plan Note (Signed)
Patient with previous A1c 6.9. Would benefit from treatment.  - Ordered metformin and resent for PA for jardiance.  - Follow up with PCP

## 2022-11-22 ENCOUNTER — Ambulatory Visit: Payer: Self-pay | Admitting: Student

## 2022-11-25 ENCOUNTER — Telehealth: Payer: Self-pay | Admitting: Family Medicine

## 2022-11-25 NOTE — Telephone Encounter (Signed)
Called patient to discuss elevated BNP and that I would like her to have an echocardiogram.  Patient said that she would not want this.  Said that her lower extremity swelling has improved.  Denies any shortness of breath or chest pain.  I asked patient if she could follow-up soon to make sure that swelling is not getting worse and that she does not have any cardiac symptoms.  Patient said that she would only like to see her doctor.  I told patient that she does not have an opening until January 6.  Patient said that she would like to wait until then.  Made appointment for January 6 but gave patient return precautions and ED precautions to go if she has chest pain or shortness of breath or rapid worsening of swelling.

## 2022-11-25 NOTE — Addendum Note (Signed)
Addended by: Lockie Mola on: 11/25/2022 05:13 PM   Modules accepted: Orders

## 2022-11-26 NOTE — Telephone Encounter (Signed)
Erroneous encounter

## 2022-12-07 ENCOUNTER — Ambulatory Visit: Payer: Medicaid Other | Admitting: Family Medicine

## 2022-12-07 NOTE — Progress Notes (Deleted)
    SUBJECTIVE:   CHIEF COMPLAINT / HPI:   Seen on 10/11 for worsening bilateral lower extremity edema, was taking Lasix 40 mg.  BNP was elevated at that time (458), patient did not want to have an echocardiogram done.  Was prescribed urea cream twice daily in addition to eucerin or aquaphor in June for thickened skin 2/2 chronic venous insufficiency   PERTINENT  PMH / PSH: HTN, chronic venous insufficiency, T2DM, HLD  OBJECTIVE:   There were no vitals taken for this visit.  ***  ASSESSMENT/PLAN:   No problem-specific Assessment & Plan notes found for this encounter.     Para March, DO [facility]

## 2022-12-14 ENCOUNTER — Ambulatory Visit: Payer: Medicaid Other | Admitting: Student

## 2022-12-28 ENCOUNTER — Telehealth: Payer: Self-pay

## 2022-12-28 NOTE — Telephone Encounter (Signed)
Patient calls nurse line requesting medication for decreased energy levels. She reports that this has been going on for about three weeks.   Advised that patient would need to schedule appointment to discuss this further with the doctor.   Patient states that she will need to call back to schedule appointment once she is able to look at calendar.   Veronda Prude, RN

## 2023-01-04 ENCOUNTER — Ambulatory Visit: Payer: Medicaid Other

## 2023-01-04 VITALS — BP 108/76 | HR 90 | Ht 66.0 in | Wt 200.6 lb

## 2023-01-04 DIAGNOSIS — I872 Venous insufficiency (chronic) (peripheral): Secondary | ICD-10-CM

## 2023-01-04 MED ORDER — TRIAMCINOLONE ACETONIDE 0.025 % EX CREA
1.0000 | TOPICAL_CREAM | Freq: Every day | CUTANEOUS | 1 refills | Status: DC | PRN
Start: 2023-01-04 — End: 2023-04-21

## 2023-01-04 NOTE — Progress Notes (Signed)
    SUBJECTIVE:   CHIEF COMPLAINT / HPI:   Sarah Macdonald is a 57yo F w/ hx of BL LE edema that p/f BL LE itching and pain.  - Has been having chronic leg swelling and pain in BL legs - feels like it is worse with cold weather - Have been more swollen - Having itching and burning sensation in her legs - Denies SOB.  - Has been having drainage in BL legs - Has also had compression stockings daily - No fevers  OBJECTIVE:   BP 108/76   Ht 5\' 6"  (1.676 m)   Wt 200 lb 9.6 oz (91 kg)   BMI 32.38 kg/m   General: Alert, pleasant woman. NAD. HEENT: NCAT. MMM. CV: RRR, no murmurs. Cap refill <2. Resp: CTAB, no wheezing or crackles. Normal WOB on RA.  Abm: Soft, nontender, nondistended. BS present. Ext: Moves all ext spontaneously. Normal gait. Skin: Diffusely erythematous, lichenified appearance of BL LE with scattered shallow ulcerations/excoriation marks. Tender to palpation around ulcertions. Mild seroud drainage from ulcerations.  ASSESSMENT/PLAN:   Assessment & Plan Venous stasis dermatitis Exam and hx is most c/w chronic venous insufficiency and venous stasis dermatitis. Less likely DVT (BL, no SOB, no diffuse tenderness), cellulitis )no fever and minimal tenderness except for ulcerations). - Start daily triamcinolone 0.025% daily prn for itching - Cont daily compression stocking - Nighly elevation of legs - Frequently apply thick vaseline to legs - Has echo scheduled in December     Lincoln Brigham, MD [facility]

## 2023-01-04 NOTE — Patient Instructions (Signed)
Good to see you today - Thank you for coming in  Things we discussed today:  1) the swelling and pain in your leg is due to chronic venous insufficiency.  This is where your blood vessels lose their stretching is, and then blood starts to collect in your legs, causing pressure and discomfort. - Apply triamcinolone cream to the legs once a day for 7 days. Then only as needed. - Try to wear your compression stockings as much as possible during the day - At night, keep pillows under your leg to keep them elevated. This helps with the swelling. - Apply a thick layer of vaseline to your legs daily. If you feel the urge to scratch, apply vaseline in that area.

## 2023-01-05 ENCOUNTER — Ambulatory Visit: Payer: Medicaid Other | Admitting: Student

## 2023-01-10 ENCOUNTER — Telehealth: Payer: Self-pay

## 2023-01-10 DIAGNOSIS — I872 Venous insufficiency (chronic) (peripheral): Secondary | ICD-10-CM

## 2023-01-10 NOTE — Telephone Encounter (Signed)
Patient calls nurse line in regards to legs.   She reports she was able to pick up the Triamcinolone Cream, however was not able to pick up the antibiotic.   According to provider note, I do not see where an antibiotic was called in.   She reports continued bilateral leg pain. She denies any fevers or chills or malodorous drainage.   Advised will forward to provider who saw patient.

## 2023-01-11 NOTE — Telephone Encounter (Signed)
Called patient.   Patient reports she has been experiencing drainage from her legs for the past 2 days. She reports when "they drain, they hurt." She reports the drainage is a new symptom and was not present last week.   She denies any fevers, body aches or odors. She reports the drainage is clear.  Patient advised to make an apt for further evaluation. She is continuing to request antibiotics.   Patient scheduled for Thursday, she requests the same provider as before.   Infection precautions discussed in the meantime.

## 2023-01-13 ENCOUNTER — Ambulatory Visit: Payer: Medicaid Other | Admitting: Family Medicine

## 2023-01-17 NOTE — Telephone Encounter (Signed)
Attempted to call patient back regarding her missed appointment, however no answer and no voicemail set up.  It sounds like patient has been having more drainage and pain in her legs, but no fevers.  It is likely that patient has superficial infection on top of her venous stasis dermatitis, and it would be reasonable to start empiric treatment with Keflex.  However I would want to speak with patient and check to see if she is having fevers or other systemic symptoms.  It looks like she has a follow-up appointment scheduled with Dr. Melissa Noon on 12/13. -Consider antibiotic course for skin infections such as Keflex at follow-up appointment -Encourage supportive management of venous stasis dermatitis

## 2023-01-18 MED ORDER — CEPHALEXIN 250 MG PO CAPS: 250.0000 mg | ORAL_CAPSULE | Freq: Four times a day (QID) | ORAL | 0 refills | Status: AC

## 2023-01-18 NOTE — Telephone Encounter (Signed)
Called patient. She did not answer, LVM advising of provider message.   Veronda Prude, RN

## 2023-01-18 NOTE — Addendum Note (Signed)
Addended by: Lincoln Brigham on: 01/18/2023 04:18 PM   Modules accepted: Orders

## 2023-01-18 NOTE — Telephone Encounter (Signed)
Patient presents to clinic regarding concerns with legs. She reports that she is having continued drainage and itching from both legs.   Reports that drainage is clear. Denies odor, fever, chills.   She does not feel like she can wait until follow up appointment with Dr. Melissa Noon on 12/13. Advised that Dr. Sherrilee Gilles attempted to call her yesterday to check on her. She states that number on file is currently off. She provided [place] with updated contact information. Offered appointment in skin clinic for tomorrow. Patient declines at this time, requesting that I send message back to Dr. Sherrilee Gilles for next steps.   She is requesting returned call at [phone].   Sarah Prude, RN

## 2023-01-18 NOTE — Telephone Encounter (Signed)
Prescription sent for Keflex 250 4 times daily for 7 days.  Has follow-up appointment scheduled 12/13. Can reassess symptoms at that time

## 2023-01-21 ENCOUNTER — Ambulatory Visit: Payer: Medicaid Other | Admitting: Student

## 2023-01-25 ENCOUNTER — Encounter (HOSPITAL_COMMUNITY): Payer: Self-pay | Admitting: *Deleted

## 2023-01-25 ENCOUNTER — Ambulatory Visit (HOSPITAL_COMMUNITY)
Admission: EM | Admit: 2023-01-25 | Discharge: 2023-01-25 | Disposition: A | Discharge status: Home / Self Care | Payer: Medicaid Other

## 2023-01-25 DIAGNOSIS — I872 Venous insufficiency (chronic) (peripheral): Secondary | ICD-10-CM

## 2023-01-25 NOTE — ED Provider Notes (Signed)
[facility]    CSN: [id number] Arrival date & time: 01/25/23  1319      History   Chief Complaint Chief Complaint  Patient presents with   Leg Pain    HPI Sarah Macdonald is a 57 y.o. female.   Patient here c/w "leg swelling" x months.  She has history of DM, chronic venous insufficiency.  She sees PCP for this, next appointment 1 month. She states she was at work and a co-worker wrapped her leg due to "oozing" and she would like her other leg wrapped.  She denies chest pain, SOB, n/v, pain.  Currently on keflex.      Past Medical History:  Diagnosis Date   DVT (deep venous thrombosis) (HCC)    Hyperlipidemia     Patient Active Problem List   Diagnosis Date Noted   Microalbuminuria due to type 2 diabetes mellitus (HCC) 08/02/2022   Weight loss 12/12/2021   Diabetes mellitus without complication (HCC) 03/18/2020   Primary osteoarthritis of left knee 03/06/2020   BMI 40.0-44.9, adult (HCC) 06/18/2019   Chronic venous insufficiency 11/20/2018   Nocturnal leg cramps 03/14/2017   Hyperlipidemia 08/18/2012   HTN (hypertension) 08/18/2012    History reviewed. No pertinent surgical history.  OB History   No obstetric history on file.      Home Medications    Prior to Admission medications   Medication Sig Start Date End Date Taking? Authorizing Provider  acetaminophen (TYLENOL) 500 MG tablet Take 2 tablets (1,000 mg total) by mouth every 8 (eight) hours as needed. 09/21/19  Yes Brimage, Vondra, DO  Blood Pressure Monitoring (BLOOD PRESSURE MONITOR AUTOMAT) DEVI 1 kit by Does not apply route daily. Please call the office if you have any issues obtaining this monitor. 06/18/19  Yes Melene Plan, MD  cephALEXin (KEFLEX) 250 MG capsule Take 1 capsule (250 mg total) by mouth 4 (four) times daily for 7 days. 01/18/23 01/25/23 Yes Lincoln Brigham, MD  diclofenac Sodium (VOLTAREN) 1 % GEL Apply 2 g topically 4 (four) times daily. 10/01/22  Yes Glendale Chard, DO   DULoxetine (CYMBALTA) 60 MG capsule TAKE 1 CAPSULE(60 MG) BY MOUTH DAILY 08/13/22  Yes Glendale Chard, DO  empagliflozin (JARDIANCE) 25 MG TABS tablet Take 1 tablet (25 mg total) by mouth daily. 10/01/22  Yes Glendale Chard, DO  furosemide (LASIX) 40 MG tablet TAKE 1 TABLET(40 MG) BY MOUTH EVERY MORNING 11/19/22  Yes Lockie Mola, MD  losartan (COZAAR) 100 MG tablet TAKE 1 TABLET BY MOUTH EVERY DAY RETURN TO CLINIC FOR BLOOD PRESSURE CHECK AND CLOOD WORK 08/13/22  Yes Glendale Chard, DO  metFORMIN (GLUCOPHAGE-XR) 500 MG 24 hr tablet Take 2 tablets (1,000 mg total) by mouth 2 (two) times daily with a meal. 11/19/22  Yes Lockie Mola, MD  Multiple Vitamin (MULTIVITAMIN) capsule Take 1 capsule by mouth daily. 01/02/19  Yes Mirian Mo, MD  rosuvastatin (CRESTOR) 40 MG tablet Take 1 tablet (40 mg total) by mouth daily. 08/02/22  Yes Valetta Close, MD  triamcinolone (KENALOG) 0.025 % cream Apply 1 Application topically daily as needed. 01/04/23  Yes Lincoln Brigham, MD  urea (CARMOL) 10 % cream APPLY 1 APPLICATION TOPICALLY DAILY 08/30/22  Yes Glendale Chard, DO    Family History Family History  Problem Relation Age of Onset   Hypertension Mother    Hypertension Father    Hyperlipidemia Sister     Social History Social History   Tobacco Use   Smoking status: Former  Current packs/day: 0.00    Types: Cigarettes    Quit date: 01/25/2012    Years since quitting: 11.0   Smokeless tobacco: Never  Vaping Use   Vaping status: Never Used  Substance Use Topics   Alcohol use: No   Drug use: No     Allergies   Cortisone   Review of Systems Review of Systems  Constitutional:  Negative for chills, fatigue and fever.  Respiratory:  Negative for cough, shortness of breath and wheezing.   Cardiovascular:  Positive for leg swelling. Negative for chest pain and palpitations.  Gastrointestinal:  Negative for abdominal pain, nausea and vomiting.  Musculoskeletal:  Negative for arthralgias and  myalgias.  Skin:  Positive for wound. Negative for color change.  Neurological:  Negative for dizziness and numbness.  Hematological:  Negative for adenopathy. Does not bruise/bleed easily.  Psychiatric/Behavioral:  Negative for sleep disturbance.      Physical Exam Triage Vital Signs ED Triage Vitals  Encounter Vitals Group     BP 01/25/23 1504 116/78     Systolic BP Percentile --      Diastolic BP Percentile --      Pulse Rate 01/25/23 1504 (!) 104     Resp 01/25/23 1504 (!) 22     Temp 01/25/23 1504 97.8 F (36.6 C)     Temp Source 01/25/23 1504 Oral     SpO2 01/25/23 1504 90 %     Weight --      Height --      Head Circumference --      Peak Flow --      Pain Score 01/25/23 1502 8     Pain Loc --      Pain Education --      Exclude from Growth Chart --    No data found.  Updated Vital Signs BP 116/78 (BP Location: Right Arm)   Pulse (!) 104   Temp 97.8 F (36.6 C) (Oral)   Resp (!) 22   SpO2 93%   Visual Acuity Right Eye Distance:   Left Eye Distance:   Bilateral Distance:    Right Eye Near:   Left Eye Near:    Bilateral Near:     Physical Exam Vitals and nursing note reviewed.  Constitutional:      General: She is not in acute distress.    Appearance: Normal appearance. She is not ill-appearing.  HENT:     Head: Normocephalic and atraumatic.  Eyes:     General: No scleral icterus.    Extraocular Movements: Extraocular movements intact.     Conjunctiva/sclera: Conjunctivae normal.  Cardiovascular:     Rate and Rhythm: Regular rhythm. Tachycardia present.     Heart sounds: No murmur heard. Pulmonary:     Effort: Pulmonary effort is normal. No respiratory distress.     Breath sounds: Normal breath sounds. No wheezing or rales.  Musculoskeletal:     Cervical back: Normal range of motion. No rigidity.     Comments: Lichenification L LE w/ scattered shallow ulcerations. serous drainage from ulcerations.  Skin:    Coloration: Skin is not jaundiced.      Findings: No rash.  Neurological:     General: No focal deficit present.     Mental Status: She is alert and oriented to person, place, and time.     Motor: No weakness.     Gait: Gait normal.  Psychiatric:        Mood and Affect: Mood normal.  Behavior: Behavior normal.      UC Treatments / Results  Labs (all labs ordered are listed, but only abnormal results are displayed) Labs Reviewed - No data to display  EKG   Radiology No results found.  Procedures Procedures (including critical care time)  Medications Ordered in UC Medications - No data to display  Initial Impression / Assessment and Plan / UC Course  I have reviewed the triage vital signs and the nursing notes.  Pertinent labs & imaging results that were available during my care of the patient were reviewed by me and considered in my medical decision making (see chart for details).     Patient request we wrap L leg.  R leg already wrapped with coban.  We discussed having her remove her pants to wrap her leg, she declines, she stated she wanted her pant leg cut (her R pant leg was cut PTA by co-worker earlier today, so her pants are already ruined). No signs of infection Patient w/o SOB, chest pain. Chart reviewed, O2 levels between 90 - 93% at PCP last two office visits in November and October. Change dressing twice daily Follow up with PCP, vascular surgeon Go to ED if symptoms worsen Patient declined ED - she "needs to catch the bus." Final Clinical Impressions(s) / UC Diagnoses   Final diagnoses:  Chronic venous insufficiency     Discharge Instructions      Follow up with PCP Go to ED with worsening symptoms    ED Prescriptions   None    PDMP not reviewed this encounter.   Evern Core, PA-C 01/25/23 1547

## 2023-01-25 NOTE — ED Triage Notes (Signed)
Pt states that she has bilateral leg swelling, pain and drainage X couple weeks.   She states that work wrapped her right leg since the drainage was so bad it was soaking her socks and shoes. A friend at work cut her pants leg to wrap her leg.   She is on an antibiotic states she has 5 more tablets left. She states her doctor is out of the office on leave.

## 2023-01-25 NOTE — Discharge Instructions (Signed)
Follow up with PCP Go to ED with worsening symptoms.

## 2023-01-25 NOTE — ED Notes (Addendum)
Pts O2 level is 88-90 advised pt she might need to be placed on O2 she declined.

## 2023-01-26 ENCOUNTER — Other Ambulatory Visit: Payer: Self-pay | Admitting: Student

## 2023-01-26 DIAGNOSIS — E119 Type 2 diabetes mellitus without complications: Secondary | ICD-10-CM

## 2023-01-26 DIAGNOSIS — R202 Paresthesia of skin: Secondary | ICD-10-CM

## 2023-02-06 ENCOUNTER — Ambulatory Visit (HOSPITAL_COMMUNITY)
Admission: EM | Admit: 2023-02-06 | Discharge: 2023-02-06 | Disposition: A | Discharge status: Home / Self Care | Payer: Medicaid Other | Attending: Emergency Medicine | Admitting: Emergency Medicine

## 2023-02-06 ENCOUNTER — Encounter (HOSPITAL_COMMUNITY): Payer: Self-pay | Admitting: Emergency Medicine

## 2023-02-06 ENCOUNTER — Other Ambulatory Visit: Payer: Self-pay

## 2023-02-06 DIAGNOSIS — E119 Type 2 diabetes mellitus without complications: Secondary | ICD-10-CM

## 2023-02-06 DIAGNOSIS — L97929 Non-pressure chronic ulcer of unspecified part of left lower leg with unspecified severity: Secondary | ICD-10-CM | POA: Diagnosis not present

## 2023-02-06 DIAGNOSIS — L97919 Non-pressure chronic ulcer of unspecified part of right lower leg with unspecified severity: Secondary | ICD-10-CM | POA: Diagnosis not present

## 2023-02-06 DIAGNOSIS — I872 Venous insufficiency (chronic) (peripheral): Secondary | ICD-10-CM

## 2023-02-06 HISTORY — DX: Type 2 diabetes mellitus without complications: E11.9

## 2023-02-06 LAB — POCT FASTING CBG KUC MANUAL ENTRY: POCT Glucose (KUC): 97 mg/dL (ref 70–99)

## 2023-02-06 NOTE — ED Provider Notes (Signed)
[facility]    CSN: [id number] Arrival date & time: 02/06/23  1003      History   Chief Complaint Chief Complaint  Patient presents with   Leg Pain    HPI Sarah Macdonald is a 57 y.o. female.  Bilateral leg pain for several months Reporting draining and itching for a few weeks Seen here 2 weeks ago for same, and a week before that was put on keflex to cover for secondary infection She no showed her last two PCP appointments. The next one is scheduled for tomorrow.  States using triamcinolone daily  Hx T2DM and chronic venous insufficiency   Would like legs wrapped today  Past Medical History:  Diagnosis Date   Diabetes mellitus without complication (HCC)    DVT (deep venous thrombosis) (HCC)    Hyperlipidemia    Hypertension     Patient Active Problem List   Diagnosis Date Noted   Microalbuminuria due to type 2 diabetes mellitus (HCC) 08/02/2022   Weight loss 12/12/2021   Diabetes mellitus without complication (HCC) 03/18/2020   Primary osteoarthritis of left knee 03/06/2020   BMI 40.0-44.9, adult (HCC) 06/18/2019   Chronic venous insufficiency 11/20/2018   Nocturnal leg cramps 03/14/2017   Hyperlipidemia 08/18/2012   HTN (hypertension) 08/18/2012    History reviewed. No pertinent surgical history.  OB History   No obstetric history on file.      Home Medications    Prior to Admission medications   Medication Sig Start Date End Date Taking? Authorizing Provider  acetaminophen (TYLENOL) 500 MG tablet Take 2 tablets (1,000 mg total) by mouth every 8 (eight) hours as needed. 09/21/19   Katha Cabal, DO  Blood Pressure Monitoring (BLOOD PRESSURE MONITOR AUTOMAT) DEVI 1 kit by Does not apply route daily. Please call the office if you have any issues obtaining this monitor. 06/18/19   Melene Plan, MD  diclofenac Sodium (VOLTAREN) 1 % GEL Apply 2 g topically 4 (four) times daily. 10/01/22   Glendale Chard, DO  DULoxetine (CYMBALTA) 60 MG  capsule TAKE 1 CAPSULE(60 MG) BY MOUTH DAILY 01/26/23   Bess Kinds, MD  empagliflozin (JARDIANCE) 25 MG TABS tablet Take 1 tablet (25 mg total) by mouth daily. 10/01/22   Glendale Chard, DO  furosemide (LASIX) 40 MG tablet TAKE 1 TABLET(40 MG) BY MOUTH EVERY MORNING 11/19/22   Lockie Mola, MD  losartan (COZAAR) 100 MG tablet TAKE 1 TABLET BY MOUTH EVERY DAY RETURN TO CLINIC FOR BLOOD PRESSURE CHECK AND CLOOD WORK 08/13/22   Glendale Chard, DO  metFORMIN (GLUCOPHAGE-XR) 500 MG 24 hr tablet Take 2 tablets (1,000 mg total) by mouth 2 (two) times daily with a meal. 11/19/22   Lockie Mola, MD  Multiple Vitamin (MULTIVITAMIN) capsule Take 1 capsule by mouth daily. Patient not taking: Reported on 02/06/2023 01/02/19   Mirian Mo, MD  rosuvastatin (CRESTOR) 40 MG tablet Take 1 tablet (40 mg total) by mouth daily. 08/02/22   Valetta Close, MD  triamcinolone (KENALOG) 0.025 % cream Apply 1 Application topically daily as needed. 01/04/23   Lincoln Brigham, MD  urea (CARMOL) 10 % cream APPLY 1 APPLICATION TOPICALLY DAILY 08/30/22   Glendale Chard, DO    Family History Family History  Problem Relation Age of Onset   Hypertension Mother    Hypertension Father    Hyperlipidemia Sister     Social History Social History   Tobacco Use   Smoking status: Former    Current packs/day: 0.00  Types: Cigarettes    Quit date: 01/25/2012    Years since quitting: 11.0   Smokeless tobacco: Never  Vaping Use   Vaping status: Never Used  Substance Use Topics   Alcohol use: No   Drug use: No     Allergies   Cortisone   Review of Systems Review of Systems Per HPI  Physical Exam Triage Vital Signs ED Triage Vitals  Encounter Vitals Group     BP 02/06/23 1030 108/78     Systolic BP Percentile --      Diastolic BP Percentile --      Pulse Rate 02/06/23 1030 (!) 107     Resp 02/06/23 1030 20     Temp 02/06/23 1030 97.9 F (36.6 C)     Temp Source 02/06/23 1030 Oral     SpO2 02/06/23 1030  93 %     Weight --      Height --      Head Circumference --      Peak Flow --      Pain Score 02/06/23 1028 8     Pain Loc --      Pain Education --      Exclude from Growth Chart --    No data found.  Updated Vital Signs BP 108/78 (BP Location: Left Arm)   Pulse (!) 102   Temp 97.9 F (36.6 C) (Oral)   Resp 20   SpO2 93%   Physical Exam Vitals and nursing note reviewed.  Constitutional:      General: She is not in acute distress.    Appearance: She is not ill-appearing.  HENT:     Mouth/Throat:     Mouth: Mucous membranes are moist.     Pharynx: Oropharynx is clear.  Eyes:     Conjunctiva/sclera: Conjunctivae normal.  Cardiovascular:     Rate and Rhythm: Normal rate and regular rhythm.     Heart sounds: Normal heart sounds.  Pulmonary:     Effort: Pulmonary effort is normal.     Breath sounds: Normal breath sounds.  Musculoskeletal:        General: Normal range of motion.  Skin:    Findings: Wound present.     Comments: Right posterior lower leg ulceration about 5 cm diameter, with two anterior ulcerations 1 cm. Not draining. Tender to touch. The left lateral lower leg has ulceration about 3-4 cm diameter. There is thick lichenified skin. DP pulses are 2+, sensation is normal   Neurological:     Mental Status: She is alert and oriented to person, place, and time.    UC Treatments / Results  Labs (all labs ordered are listed, but only abnormal results are displayed) Labs Reviewed  POCT FASTING CBG [facility] MANUAL ENTRY    EKG  Radiology No results found.  Procedures Procedures   Medications Ordered in UC Medications - No data to display  Initial Impression / Assessment and Plan / UC Course  I have reviewed the triage vital signs and the nursing notes.  Pertinent labs & imaging results that were available during my care of the patient were reviewed by me and considered in my medical decision making (see chart for details).  CBG 97 O2 is 93% which is  her baseline over last several months  Tachycardic 107 to 102 at discharge. She is afebrile.   Wet to dry dressings are applied to bilateral legs in hopes of easing itching/dryness. Advised elevating legs. She has PCP follow up tomorrow  to discuss further. We did discuss chronic nature of symptoms and how her diabetes can affect healing time. At this time no red flags. Advised ED precautions. Patient agrees to plan. She requests a note to return to work today  Final Clinical Impressions(s) / UC Diagnoses   Final diagnoses:  Chronic venous insufficiency  Ulcers of both lower legs (HCC)     Discharge Instructions      You can keep the dressings on all day today, but I recommend to take them off in the morning. Your primary care can dress them tomorrow if needed. Please keep your appointment! Continue to elevate your legs as much as possible      ED Prescriptions   None    PDMP not reviewed this encounter.   Kathrine Haddock 02/06/23 1219

## 2023-02-06 NOTE — ED Triage Notes (Signed)
Has appt with her doctor tomorrow.  Patient reports she was here 2 weeks ago for the same issue.  Patient is diabetic.  Patient has swollen, draining, itching bilateral lower leg issues.    Patient has been putting vaseline on legs and wrapping

## 2023-02-06 NOTE — Discharge Instructions (Addendum)
You can keep the dressings on all day today, but I recommend to take them off in the morning. Your primary care can dress them tomorrow if needed. Please keep your appointment! Continue to elevate your legs as much as possible

## 2023-02-07 ENCOUNTER — Ambulatory Visit: Payer: Medicaid Other | Admitting: Student

## 2023-02-07 ENCOUNTER — Ambulatory Visit (HOSPITAL_COMMUNITY): Admission: RE | Admit: 2023-02-07 | Payer: Medicaid Other | Source: Ambulatory Visit

## 2023-02-11 ENCOUNTER — Telehealth: Payer: Self-pay

## 2023-02-11 NOTE — Telephone Encounter (Signed)
 Patient LVM on nurse line requesting returned call from nurse.   Attempted to call patient back at provided number [phone]), received message that number was not in service.   No other number in chart. Will await patient's returned call.   Chiquita JAYSON English, RN

## 2023-02-14 ENCOUNTER — Ambulatory Visit: Payer: Self-pay | Admitting: Student

## 2023-02-15 ENCOUNTER — Telehealth: Payer: Self-pay

## 2023-02-15 ENCOUNTER — Ambulatory Visit (INDEPENDENT_AMBULATORY_CARE_PROVIDER_SITE_OTHER): Payer: Medicaid Other | Admitting: Student

## 2023-02-15 ENCOUNTER — Encounter: Payer: Self-pay | Admitting: Student

## 2023-02-15 ENCOUNTER — Ambulatory Visit: Payer: Medicaid Other | Admitting: Student

## 2023-02-15 VITALS — BP 110/88 | HR 105 | Ht 66.0 in | Wt 197.0 lb

## 2023-02-15 DIAGNOSIS — I872 Venous insufficiency (chronic) (peripheral): Secondary | ICD-10-CM | POA: Diagnosis not present

## 2023-02-15 NOTE — Patient Instructions (Addendum)
 Sarah Macdonald,  These wounds are due to venous insufficiency in your lower legs.  We can keep changing these dressings until we can get you plugged in with the wound care center.  I also think that you need a vascular study to your legs called an ABI.  The best place to get this done is with our vascular surgeons here in [place].  Their office will call you to set up this appointment.  You should also be getting a call from our wound care center to set up this appointment.  Sometimes it can take a few weeks to get in there so we will bring you back here once to twice a week for dressing changes until we can get you in with wound care.  [name]

## 2023-02-15 NOTE — Telephone Encounter (Signed)
 Patient calls nurse line requesting an apt for leg wound.   Patient reports pain and drainage. She denies any fevers or chills.   She reports she has been wrapping her legs by herself and reports getting supplies from the ED and [facility] x1.  Patient has no showed multiple apts in regards to followup.  Patient apologizes for missing apt this morning and reported transportation issues.   Patient rescheduled for this afternoon. I stressed the importance of keeping this apt as she has no showed several in the last month.

## 2023-02-16 NOTE — Progress Notes (Signed)
    SUBJECTIVE:   CHIEF COMPLAINT / HPI:   Bilateral Leg Wounds Patient is here to follow-up on her bilateral chronic venous insufficiency wounds. This is a longstanding issue unfortunately complicated by poor follow-up. She does not have wound care supplies at home and would not like wound care at home as she worries this would cause interpersonal conflict with her brother whom she lives with.  No new pain or fevers, just here due to weeping wounds on the backs of both legs.  Due to the degree of edema and skin thickening from her chronic venous stasis, her distal pulses are quite difficult to palpate. On chart review, I cannot see records of formal ABI/TBIs having been done.   OBJECTIVE:   BP 110/88   Pulse (!) 105   Ht 5' 6 (1.676 m)   Wt 197 lb (89.4 kg)   BMI 31.80 kg/m   Gen: Disheveled but generally well-appearing and NAD HENT: Edentulous Cardio: RRR Pulm: Normal WOB on RA, lungs clear Ext: Bilateral chronic venous stasis changes to the bilateral legs with weeping wounds on the posterior aspect bilaterally. There is no associated warmth, redness, or significant tenderness to suggest DVT or superimposed infection.  The distal pulses are difficult to palpate due to edema and skin thickening, but the feet are warm bilaterally and she is able to wiggle toes without issue.        ASSESSMENT/PLAN:   Chronic venous insufficiency No evidence of superimposed infection but weeping wounds bilaterally.  Suspect this is primarily venous, but given difficulty palpating distal pulses, will also have her ABIs evaluated.  - Unna boots placed today, return on Friday (1/10) for dressing changes - Referred to Wound care; we will bring back 1-2x/week to nurse clinic for dressing changes until she can get in with wound care - Referred to [facility] for ABIs and vascular consult     J Con Gasman, MD [facility]

## 2023-02-16 NOTE — Assessment & Plan Note (Signed)
 No evidence of superimposed infection but weeping wounds bilaterally.  Suspect this is primarily venous, but given difficulty palpating distal pulses, will also have her ABIs evaluated.  - Unna boots placed today, return on Friday (1/10) for dressing changes - Referred to Wound care; we will bring back 1-2x/week to nurse clinic for dressing changes until she can get in with wound care - Referred to [facility] for ABIs and vascular consult

## 2023-02-18 ENCOUNTER — Ambulatory Visit: Payer: Self-pay

## 2023-02-21 ENCOUNTER — Ambulatory Visit: Payer: Medicaid Other

## 2023-02-21 ENCOUNTER — Telehealth: Payer: Self-pay

## 2023-02-21 NOTE — Telephone Encounter (Signed)
 Patient calls nurse line in regards to wound FU apt.   She reports she does not have a ride today. She reports she will be able to come tomorrow.   Apt has been adjusted.   Of note, wound care has tried to reach out to her multiple times, however her phone has been cut off according to their notes.   I asked patient if she has a better number as connecting with wound care is important.   She states, I will have my phone turned back on tomorrow. This is the number noted in her chart.   Will forward to referral coordinator to reopen referral.

## 2023-02-22 ENCOUNTER — Ambulatory Visit (INDEPENDENT_AMBULATORY_CARE_PROVIDER_SITE_OTHER): Payer: Medicaid Other | Admitting: Student

## 2023-02-22 DIAGNOSIS — I10 Essential (primary) hypertension: Secondary | ICD-10-CM | POA: Diagnosis not present

## 2023-02-22 DIAGNOSIS — I872 Venous insufficiency (chronic) (peripheral): Secondary | ICD-10-CM | POA: Diagnosis not present

## 2023-02-22 MED ORDER — AMOXICILLIN-POT CLAVULANATE 875-125 MG PO TABS: 1.0000 | ORAL_TABLET | Freq: Two times a day (BID) | ORAL | 0 refills | Status: AC

## 2023-02-22 MED ORDER — LOSARTAN POTASSIUM 100 MG PO TABS: 100.0000 mg | ORAL_TABLET | Freq: Every day | ORAL | 3 refills | Status: DC

## 2023-02-22 NOTE — Assessment & Plan Note (Signed)
 Given warmth and anterior right calf with weeping wounds concerning for acute infection, will prescribe Augmentin  twice daily for 7 days as she is a type II diabetic. Suspicion remains for chronic venous insufficiency. RN wrapped legs bilaterally with Unna boots. Provided phone number for wound care center, patient will call and get this scheduled Return in 3 days for follow-up to ensure she is not having worsening Return precautions discussed to go to the ED if she has fever, excruciating leg pain, purulence or excessive drainage

## 2023-02-22 NOTE — Patient Instructions (Addendum)
 It was great seeing you today.  As we discussed, - Pick up your antibiotic from the pharmacy, called Augmentin . Take this as prescribed. - I refilled your blood pressure medication - Contact wound care ASAP:   Wound Care [address] [place], [place] [address] Ph [phone]  Return in Friday for follow-up. Please schedule at the front desk before you leave.   If you have any questions or concerns, please feel free to call the clinic.   Have a wonderful day,  Dr. Barabara Dama [facility] [phone]

## 2023-02-22 NOTE — Progress Notes (Signed)
    SUBJECTIVE:   CHIEF COMPLAINT / HPI:   Sarah Macdonald is a 58 year-old female with lower extremity wounds.02/15/2023 for long-standing bilateral leg wounds. She was placed in Una boots and wound care referral.  Tolerated Una boots for 1 week, but did not have them on today when arrived to the clinic. Having itching in her left leg mainly. No fevers or chills.  Wound care unable to reach patient because her phone was disconnected. Has not yet had her ABIs completed.  PERTINENT  PMH / PSH:  T2DM   OBJECTIVE:   There were no vitals taken for this visit. General: Disheveled, but generally well-appearing Respiratory: Normal effort on room air Skin: Lower extremities with bilateral chronic venous stasis changes and worsened weeping wound on right posterior calf.  There is warmth from the mid shin to the patella on the right side anteriorly.  Distal pulses are palpable very faintly.  Sensation intact in bilateral lower extremities  ASSESSMENT/PLAN:   Chronic venous insufficiency Given warmth and anterior right calf with weeping wounds concerning for acute infection, will prescribe Augmentin  twice daily for 7 days as she is a type II diabetic. Suspicion remains for chronic venous insufficiency. RN wrapped legs bilaterally with Unna boots. Provided phone number for wound care center, patient will call and get this scheduled Return in 3 days for follow-up to ensure she is not having worsening Return precautions discussed to go to the ED if she has fever, excruciating leg pain, purulence or excessive drainage     Barabara Dama, DO [facility]

## 2023-02-22 NOTE — Progress Notes (Signed)
 Unna boots placed to bilateral legs per orders from Dr. Melissa Noon and Dr. Linwood Dibbles.   Patient tolerated this well.   Red flags discussed.   Scheduled patient for follow up on Friday, 02/25/23.  Veronda Prude, RN

## 2023-02-24 NOTE — Telephone Encounter (Signed)
I tried to call pt. Number is not in service.  Clemencia Course, CMA

## 2023-02-25 ENCOUNTER — Ambulatory Visit (INDEPENDENT_AMBULATORY_CARE_PROVIDER_SITE_OTHER): Payer: Medicaid Other | Admitting: Student

## 2023-02-25 ENCOUNTER — Ambulatory Visit: Payer: Medicaid Other

## 2023-02-25 VITALS — BP 119/96 | HR 88 | Ht 66.0 in | Wt 208.0 lb

## 2023-02-25 DIAGNOSIS — I872 Venous insufficiency (chronic) (peripheral): Secondary | ICD-10-CM | POA: Diagnosis not present

## 2023-02-25 NOTE — Patient Instructions (Addendum)
Great to see you.  Today we inspected your wound and there was no signs of infection, swelling seems to have come down a bit.  We rewrapped your wounds with [employer].  Please make sure you complete the antibiotic course.  Also make sure to contact the wound care center to help with managing your wound.  Number is provided below.  Follow-up in 4 days (03/01/23)  Wound Care [address] [place], [place] [address] Ph [phone]

## 2023-02-25 NOTE — Progress Notes (Unsigned)
    SUBJECTIVE:   CHIEF COMPLAINT / HPI: Wound follow-up  58 year old female with history of chronic venous insufficiency and T2DM Presenting today for wound follow-up Seen 3 days ago for calf wound suspected to be due to venous stasis ulcer Treated with Unna boot and Augmentin for 7 days The patient denies any drainage, worsening pain or fever. Patient has not been able to contact wound care center. Endorses compliance to antibiotics and pain slightly improved.  PERTINENT  PMH / PSH: Reviewed   OBJECTIVE:   BP (!) 119/96   Pulse 88   Ht 5\' 6"  (1.676 m)   Wt 208 lb (94.3 kg)   SpO2 97%   BMI 33.57 kg/m    Physical Exam General: Alert, well appearing, NAD, Oriented x4 Cardiovascular: RRR, No Murmurs, Normal S2/S2 Respiratory: CTAB, No wheezing or Rales Lower extremities: Skin changes consistent with bilateral chronic venous stasis.  Does have associated edema bilaterally however improved from previously.  No new ulcers located.  Difficult to palpate distal pulses likely due to the ongoing edema however legs are warm to touch and sensation in tact   ASSESSMENT/PLAN:   Chronic venous insufficiency Venous stasis ulcer unchanged when compared to picture from few days ago however pain is improved per patient and exam LE edema is reduced as well. Wound healing is complicated in this patient with Type 2 Diabetes. She would benefit from seeing wound care, unfortunately patient hasn't followed up with resources provided to her. Neomia Dear booth applied today -Patient encouraged to complete her antibiotic course -Recommend keeping legs elevated intermittently to reduce swelling -Advised to contact wound center and emphasized need to be seen soon -Follow up in 4 days    Jerre Simon, MD [facility]

## 2023-02-26 NOTE — Assessment & Plan Note (Addendum)
Venous stasis ulcer unchanged when compared to picture from few days ago however pain is improved per patient and exam LE edema is reduced as well. Wound healing is complicated in this patient with Type 2 Diabetes. She would benefit from seeing wound care, unfortunately patient hasn't followed up with resources provided to her. Neomia Dear booth applied today -Patient encouraged to complete her antibiotic course -Recommend keeping legs elevated intermittently to reduce swelling -Advised to contact wound center and emphasized need to be seen soon -Follow up in 4 days

## 2023-03-01 ENCOUNTER — Encounter: Payer: Self-pay | Admitting: Family Medicine

## 2023-03-01 ENCOUNTER — Ambulatory Visit (INDEPENDENT_AMBULATORY_CARE_PROVIDER_SITE_OTHER): Payer: Medicaid Other | Admitting: Family Medicine

## 2023-03-01 VITALS — BP 117/88 | HR 113 | Ht 66.0 in | Wt 213.8 lb

## 2023-03-01 DIAGNOSIS — R0902 Hypoxemia: Secondary | ICD-10-CM

## 2023-03-01 DIAGNOSIS — R011 Cardiac murmur, unspecified: Secondary | ICD-10-CM

## 2023-03-01 DIAGNOSIS — I872 Venous insufficiency (chronic) (peripheral): Secondary | ICD-10-CM | POA: Diagnosis not present

## 2023-03-01 NOTE — Patient Instructions (Addendum)
Thank you for coming in today! Here is a summary of what we discussed:  Your wounds look a bit better today! Please make a follow up appointment for early next week so we can check your wounds again.  Please call the clinic at [phone] if your symptoms worsen or you have any concerns.  Best, Dr Dolan Amen

## 2023-03-01 NOTE — Progress Notes (Unsigned)
    SUBJECTIVE:   CHIEF COMPLAINT / HPI:   Follow up bilateral leg wounds Chronic problem, recently completed 7 day course of Augmentin due to concern for R leg cellulitis. Had new Unna boots placed on 1/17. Patient's primary complaint is bilateral pain and itching from the unna boots. She has not noticed drainage and denies fevers/chills. Feels that swelling is a bit better. Has not been in contact with wound care yet.   Hypoxia and tachycardia Denies dyspnea and chest pain. Endorses dyspnea with exertion but states this is not new.  PERTINENT  PMH / PSH: HTN, chronic venous insufficiency, type 2 diabetes, obesity  OBJECTIVE:   BP 117/88   Pulse (!) 113   Ht 5\' 6"  (1.676 m)   Wt 213 lb 12.8 oz (97 kg)   SpO2 (!) 87%   BMI 34.51 kg/m    Gen: sitting in exam room, awake and conversant, in NAD CV: RRR, loud systolic murmur heard at LLSB Pulm: CTAB, normal WOB on RA, speaking in full sentances Ext: chronic venous stasis skin changes with symmetrical non-pitting edema bilaterally. Small amount of weeping drainage from posterior R leg wound. Wounds appear overall improved compared to photos from 11/17 (see media tab). 1+ DP and PT pulses palpated bilaterally  Assessment & Plan Chronic venous insufficiency  Systolic murmur  Hypoxia     Lorayne Bender, MD [facility]

## 2023-03-02 ENCOUNTER — Telehealth: Payer: Self-pay | Admitting: Family Medicine

## 2023-03-02 NOTE — Assessment & Plan Note (Signed)
Venous stasis wounds improved on exam today with reduced edema, able to palpate distal pulses. Nursing staff assisted with scheduling patient for appointment with wound care, unfortunately first available is not until early March.  -Unna boots reapplied today -Encouraged continued leg elevation as able -Scheduled for follow up at [facility] 1/28 to reassess wounds

## 2023-03-02 NOTE — Telephone Encounter (Signed)
Called patient to make sure she is aware of echocardiogram appointment this Friday. Patient is aware, reviewed reason for echo and time of appointment. Patient did not have any questions.

## 2023-03-04 ENCOUNTER — Ambulatory Visit (HOSPITAL_COMMUNITY): Admission: RE | Admit: 2023-03-04 | Payer: Medicaid Other | Source: Ambulatory Visit

## 2023-03-07 ENCOUNTER — Encounter: Payer: Self-pay | Admitting: Student

## 2023-03-07 ENCOUNTER — Ambulatory Visit: Payer: Medicaid Other | Admitting: Student

## 2023-03-07 VITALS — BP 122/79 | HR 120 | Ht 66.0 in | Wt 216.8 lb

## 2023-03-07 DIAGNOSIS — I872 Venous insufficiency (chronic) (peripheral): Secondary | ICD-10-CM

## 2023-03-07 MED ORDER — ACETAMINOPHEN 500 MG PO TABS
1000.0000 mg | ORAL_TABLET | Freq: Once | ORAL | Status: AC
  Administered 2023-03-07: 1000 mg via ORAL

## 2023-03-07 NOTE — Patient Instructions (Signed)
It was great to see you! Thank you for allowing me to participate in your care!   I recommend that you always bring your medications to each appointment as this makes it easy to ensure we are on the correct medications and helps [place] not miss when refills are needed.  Our plans for today:  - You can use warm water and a soft rag to remove the remaining residue. Please dry the area well after washing. - You vaseline to cover open areas - Follow-up in 2 weeks  Take care and seek immediate care sooner if you develop any concerns. Please remember to show up 15 minutes before your scheduled appointment time!  Tiffany Kocher, DO [facility]

## 2023-03-07 NOTE — Progress Notes (Signed)
    SUBJECTIVE:   CHIEF COMPLAINT / HPI:   Chronic venous insufficiency Patient was previously seen on 03/01/2023, she has had multiple visits over the past several weeks for chronic leg wounds 2/2 venous insufficiency.  Unna boot was reapplied on 11/21/125.  Patient reports she cannot take the pruritus from [employer] and requesting a break.  She has kept Radio broadcast assistant on in place.  She continues to take her furosemide 40 mg daily.  OBJECTIVE:   BP 122/79   Pulse (!) 120   Ht 5\' 6"  (1.676 m)   Wt 216 lb 12.8 oz (98.3 kg)   SpO2 95%   BMI 34.99 kg/m    Legs: Healing chronic ulcerations, granulation tissue present, no evidence of infection.             ASSESSMENT/PLAN:   Assessment & Plan Chronic venous insufficiency Chronic venous insufficiency.  Unna boot removed today.  Wounds healing as above.  Will give break from [employer] given patient's severe pruritus. -Discussed wound care including covering wounds with petroleum jelly, and cleaning with gentle soap and water then drying thoroughly. -Consider reapplication of Unna boot if necessary -Follow-up in 2 weeks -Follow-up with vascular surgery and wound care clinic in March  Tiffany Kocher, DO [facility]

## 2023-03-07 NOTE — Assessment & Plan Note (Signed)
Chronic venous insufficiency.  Unna boot removed today.  Wounds healing as above.  Will give break from [employer] given patient's severe pruritus. -Discussed wound care including covering wounds with petroleum jelly, and cleaning with gentle soap and water then drying thoroughly. -Consider reapplication of Unna boot if necessary -Follow-up in 2 weeks -Follow-up with vascular surgery and wound care clinic in March

## 2023-03-07 NOTE — Progress Notes (Deleted)
    SUBJECTIVE:   CHIEF COMPLAINT / HPI:   ***  PERTINENT  PMH / PSH: ***  OBJECTIVE:   BP 122/79   Pulse (!) 120   Ht 5\' 6"  (1.676 m)   Wt 216 lb 12.8 oz (98.3 kg)   SpO2 95%   BMI 34.99 kg/m   ***  ASSESSMENT/PLAN:   No problem-specific Assessment & Plan notes found for this encounter.     Tiffany Kocher, DO [facility]

## 2023-03-08 ENCOUNTER — Other Ambulatory Visit: Payer: Self-pay | Admitting: Student

## 2023-03-08 ENCOUNTER — Ambulatory Visit: Payer: Medicaid Other | Admitting: Family Medicine

## 2023-03-15 ENCOUNTER — Telehealth: Payer: Self-pay

## 2023-03-15 NOTE — Telephone Encounter (Signed)
Patient LVM on nurse line requesting returned call from nurse.   Attempted to return call to patient. She did not answer, LVM asking her to return call.   Veronda Prude, RN

## 2023-03-17 ENCOUNTER — Telehealth: Payer: Self-pay

## 2023-03-17 NOTE — Telephone Encounter (Signed)
 Patient calls nurse line requesting an antibiotic.   Patient reports she was seen last week for bilateral LE wound FU. She saw [facility] on 01/27 and was told to let her legs breathe and unnna boots were not reapplied.   She reports both swelling and pain in both of her LEs. She reports clear drainage from both LEs. She denies any odors, fevers or chills.   Patient advised we would need to see her in clinic for evaluation.   Patient became tearful stating she can not move due to the pain and reports transportation issues.   Advised will forward to PCP for advisement.   Patient strongly encouraged to make an apt or FU with ED.  Strict ED precautions given to patient.

## 2023-03-17 NOTE — Telephone Encounter (Signed)
 Called patient, she did not answer, LVM asking her to call back to discuss further.   Elsie Halo, RN

## 2023-03-18 ENCOUNTER — Ambulatory Visit: Payer: Medicaid Other

## 2023-03-18 ENCOUNTER — Other Ambulatory Visit: Payer: Self-pay

## 2023-03-18 ENCOUNTER — Emergency Department (HOSPITAL_BASED_OUTPATIENT_CLINIC_OR_DEPARTMENT_OTHER): Payer: Medicaid Other

## 2023-03-18 ENCOUNTER — Emergency Department (HOSPITAL_COMMUNITY): Payer: Medicaid Other

## 2023-03-18 ENCOUNTER — Emergency Department (HOSPITAL_COMMUNITY)
Admission: EM | Admit: 2023-03-18 | Discharge: 2023-03-19 | Disposition: A | Discharge status: Against Medical Advice | Payer: Medicaid Other | Attending: Emergency Medicine | Admitting: Emergency Medicine

## 2023-03-18 DIAGNOSIS — L03115 Cellulitis of right lower limb: Secondary | ICD-10-CM | POA: Diagnosis not present

## 2023-03-18 DIAGNOSIS — Z7984 Long term (current) use of oral hypoglycemic drugs: Secondary | ICD-10-CM | POA: Diagnosis not present

## 2023-03-18 DIAGNOSIS — E119 Type 2 diabetes mellitus without complications: Secondary | ICD-10-CM | POA: Diagnosis not present

## 2023-03-18 DIAGNOSIS — R0989 Other specified symptoms and signs involving the circulatory and respiratory systems: Secondary | ICD-10-CM | POA: Diagnosis not present

## 2023-03-18 DIAGNOSIS — R7401 Elevation of levels of liver transaminase levels: Secondary | ICD-10-CM | POA: Diagnosis not present

## 2023-03-18 DIAGNOSIS — N179 Acute kidney failure, unspecified: Secondary | ICD-10-CM | POA: Diagnosis not present

## 2023-03-18 DIAGNOSIS — I10 Essential (primary) hypertension: Secondary | ICD-10-CM | POA: Diagnosis not present

## 2023-03-18 DIAGNOSIS — R609 Edema, unspecified: Secondary | ICD-10-CM | POA: Diagnosis not present

## 2023-03-18 DIAGNOSIS — R42 Dizziness and giddiness: Secondary | ICD-10-CM | POA: Diagnosis present

## 2023-03-18 DIAGNOSIS — I517 Cardiomegaly: Secondary | ICD-10-CM | POA: Diagnosis not present

## 2023-03-18 DIAGNOSIS — J189 Pneumonia, unspecified organism: Secondary | ICD-10-CM

## 2023-03-18 DIAGNOSIS — J168 Pneumonia due to other specified infectious organisms: Secondary | ICD-10-CM | POA: Diagnosis not present

## 2023-03-18 DIAGNOSIS — L03116 Cellulitis of left lower limb: Secondary | ICD-10-CM | POA: Insufficient documentation

## 2023-03-18 DIAGNOSIS — R600 Localized edema: Secondary | ICD-10-CM | POA: Insufficient documentation

## 2023-03-18 DIAGNOSIS — R7989 Other specified abnormal findings of blood chemistry: Secondary | ICD-10-CM

## 2023-03-18 DIAGNOSIS — L03119 Cellulitis of unspecified part of limb: Secondary | ICD-10-CM | POA: Diagnosis not present

## 2023-03-18 DIAGNOSIS — Z79899 Other long term (current) drug therapy: Secondary | ICD-10-CM | POA: Diagnosis not present

## 2023-03-18 DIAGNOSIS — R918 Other nonspecific abnormal finding of lung field: Secondary | ICD-10-CM | POA: Diagnosis not present

## 2023-03-18 DIAGNOSIS — I959 Hypotension, unspecified: Secondary | ICD-10-CM

## 2023-03-18 DIAGNOSIS — M7989 Other specified soft tissue disorders: Secondary | ICD-10-CM

## 2023-03-18 LAB — CBC WITH DIFFERENTIAL/PLATELET
Abs Immature Granulocytes: 0.04 10*3/uL (ref 0.00–0.07)
Basophils Absolute: 0 10*3/uL (ref 0.0–0.1)
Basophils Relative: 0 %
Eosinophils Absolute: 0.1 10*3/uL (ref 0.0–0.5)
Eosinophils Relative: 1 %
HCT: 37.6 % (ref 36.0–46.0)
Hemoglobin: 12.5 g/dL (ref 12.0–15.0)
Immature Granulocytes: 1 %
Lymphocytes Relative: 11 %
Lymphs Abs: 0.9 10*3/uL (ref 0.7–4.0)
MCH: 32.3 pg (ref 26.0–34.0)
MCHC: 33.2 g/dL (ref 30.0–36.0)
MCV: 97.2 fL (ref 80.0–100.0)
Monocytes Absolute: 0.5 10*3/uL (ref 0.1–1.0)
Monocytes Relative: 6 %
Neutro Abs: 6.4 10*3/uL (ref 1.7–7.7)
Neutrophils Relative %: 81 %
Platelets: 368 10*3/uL (ref 150–400)
RBC: 3.87 MIL/uL (ref 3.87–5.11)
RDW: 17.7 % — ABNORMAL HIGH (ref 11.5–15.5)
WBC: 7.9 10*3/uL (ref 4.0–10.5)
nRBC: 0 % (ref 0.0–0.2)

## 2023-03-18 LAB — TROPONIN I (HIGH SENSITIVITY): Troponin I (High Sensitivity): 21 ng/L — ABNORMAL HIGH (ref <18)

## 2023-03-18 LAB — BRAIN NATRIURETIC PEPTIDE: B Natriuretic Peptide: 929.3 pg/mL — ABNORMAL HIGH (ref 0.0–100.0)

## 2023-03-18 LAB — COMPREHENSIVE METABOLIC PANEL
ALT: 30 U/L (ref 0–44)
AST: 36 U/L (ref 15–41)
Albumin: 2.6 g/dL — ABNORMAL LOW (ref 3.5–5.0)
Alkaline Phosphatase: 266 U/L — ABNORMAL HIGH (ref 38–126)
Anion gap: 12 (ref 5–15)
BUN: 42 mg/dL — ABNORMAL HIGH (ref 6–20)
CO2: 21 mmol/L — ABNORMAL LOW (ref 22–32)
Calcium: 9.2 mg/dL (ref 8.9–10.3)
Chloride: 99 mmol/L (ref 98–111)
Creatinine, Ser: 1.88 mg/dL — ABNORMAL HIGH (ref 0.44–1.00)
GFR, Estimated: 31 mL/min — ABNORMAL LOW (ref >60)
Glucose, Bld: 113 mg/dL — ABNORMAL HIGH (ref 70–99)
Potassium: 4.2 mmol/L (ref 3.5–5.1)
Sodium: 132 mmol/L — ABNORMAL LOW (ref 135–145)
Total Bilirubin: 2.6 mg/dL — ABNORMAL HIGH (ref 0.0–1.2)
Total Protein: 7.5 g/dL (ref 6.5–8.1)

## 2023-03-18 LAB — LACTIC ACID, PLASMA: Lactic Acid, Venous: 1.9 mmol/L (ref 0.5–1.9)

## 2023-03-18 MED ORDER — DOXYCYCLINE HYCLATE 100 MG PO TABS
100.0000 mg | ORAL_TABLET | Freq: Once | ORAL | Status: AC
  Administered 2023-03-18: 100 mg via ORAL
  Filled 2023-03-18: qty 1

## 2023-03-18 MED ORDER — DOXYCYCLINE HYCLATE 100 MG PO CAPS: 100.0000 mg | ORAL_CAPSULE | Freq: Two times a day (BID) | ORAL | 0 refills | Status: DC

## 2023-03-18 NOTE — Discharge Instructions (Addendum)
 Your history, exam, and workup today revealed multiple concerning things that would require admission.  We found pneumonia, concern for cellulitis, your fluid overload was worsening, you are hypotensive, your kidney function was worsening with acute kidney injury, and your heart rate was going fast with tachycardia.  We recommended admission for safe diuresis to get the fluid off while protecting your kidney function and give you antibiotics for the pneumonia and cellulitis in your legs however you did not want to be admitted.  You are leaving AGAINST MEDICAL ADVICE and we discussed that symptoms could worsen leading to your death.  Please take the antibiotics and follow-up with your primary doctor and if you change your mind or symptoms change or worsen, please return to the nearest Emergency Department immediately.

## 2023-03-18 NOTE — Progress Notes (Deleted)
  SUBJECTIVE:   CHIEF COMPLAINT / HPI:   Presents today with concerns of leg drainage.  She was last seen on 1/27 for her chronic venous insufficiency given instruction to give Unna boot a break due to pruritus and cover wounds with Vaseline and cleaning with soap and water .  She was also advised to follow-up with vascular surgery and wound care.  PERTINENT  PMH / PSH: HTN, HLD, T2DM, chronic venous insufficiency  OBJECTIVE:  There were no vitals taken for this visit. Physical Exam   ASSESSMENT/PLAN:   Assessment & Plan  No follow-ups on file. Kieth Johnson, DO 03/18/2023, 1:13 PM PGY-3, [facility] {    This will disappear when note is signed, click to select method of visit    :1}

## 2023-03-18 NOTE — ED Notes (Signed)
 Patient still adamantly refusing admission and IV insertion.

## 2023-03-18 NOTE — ED Notes (Signed)
 CCMD called.

## 2023-03-18 NOTE — ED Triage Notes (Signed)
 Patient with hx CHF from home via EMS for eval of swelling and wounds to BLE x 1 month. Appointment for same today at 1410 but she did not go since she cannot walk. Hypotensive 86/56 but denies dizziness and declined IV with EMS.

## 2023-03-18 NOTE — ED Notes (Signed)
 Please update son.

## 2023-03-18 NOTE — ED Provider Notes (Signed)
 [place] [facility] EMERGENCY DEPARTMENT AT [facility] Provider Note   CSN: [id number] Arrival date & time: 03/18/23  1502     History  No chief complaint on file.   Sarah Macdonald is a 58 y.o. female.  The history is provided by the patient and medical records. No language interpreter was used.  Illness Location:  Fatigue, lightheadedness, and worsening peripheral edema.  More foul-smelling drainage from wounds on legs Severity:  Severe Onset quality:  Gradual Duration:  2 days Timing:  Constant Progression:  Worsening Chronicity:  New Associated symptoms: fatigue   Associated symptoms: no abdominal pain, no chest pain, no congestion, no cough, no diarrhea, no fever, no headaches, no loss of consciousness, no myalgias, no nausea, no shortness of breath, no vomiting and no wheezing        Home Medications Prior to Admission medications   Medication Sig Start Date End Date Taking? Authorizing Provider  acetaminophen  (TYLENOL ) 500 MG tablet Take 2 tablets (1,000 mg total) by mouth every 8 (eight) hours as needed. 09/21/19   Brimage, Vondra, DO  Blood Pressure Monitoring (BLOOD PRESSURE MONITOR AUTOMAT) DEVI 1 kit by Does not apply route daily. Please call the office if you have any issues obtaining this monitor. 06/18/19   Luke Vernell BRAVO, MD  diclofenac  Sodium (VOLTAREN ) 1 % GEL Apply 2 g topically 4 (four) times daily. 10/01/22   Cleotilde Perkins, DO  DULoxetine  (CYMBALTA ) 60 MG capsule TAKE 1 CAPSULE(60 MG) BY MOUTH DAILY 01/26/23   Jennelle Riis, MD  empagliflozin  (JARDIANCE ) 25 MG TABS tablet Take 1 tablet (25 mg total) by mouth daily. 10/01/22   Cleotilde Perkins, DO  furosemide  (LASIX ) 40 MG tablet TAKE 1 TABLET(40 MG) BY MOUTH EVERY MORNING 11/19/22   Nicholas Bar, MD  losartan  (COZAAR ) 100 MG tablet Take 1 tablet (100 mg total) by mouth daily. 02/22/23   Dameron, Marisa, DO  metFORMIN  (GLUCOPHAGE -XR) 500 MG 24 hr tablet Take 2 tablets (1,000 mg total) by mouth 2 (two) times  daily with a meal. 11/19/22   Nicholas Bar, MD  Multiple Vitamin (MULTIVITAMIN) capsule Take 1 capsule by mouth daily. Patient not taking: Reported on 02/06/2023 01/02/19   Dempsey Coy, MD  rosuvastatin  (CRESTOR ) 40 MG tablet Take 1 tablet (40 mg total) by mouth daily. 08/02/22   Macario Dorothyann HERO, MD  triamcinolone  (KENALOG ) 0.025 % cream Apply 1 Application topically daily as needed. 01/04/23   Elicia Hamlet, MD  urea  (CARMOL) 10 % cream APPLY 1 APPLICATION TOPICALLY DAILY 08/30/22   Cleotilde Perkins, DO      Allergies    Cortisone    Review of Systems   Review of Systems  Constitutional:  Positive for fatigue. Negative for chills, diaphoresis and fever.  HENT:  Negative for congestion.   Eyes:  Negative for visual disturbance.  Respiratory:  Negative for cough, chest tightness, shortness of breath and wheezing.   Cardiovascular:  Positive for leg swelling. Negative for chest pain and palpitations.  Gastrointestinal:  Negative for abdominal pain, constipation, diarrhea, nausea and vomiting.  Genitourinary:  Negative for dysuria and flank pain.  Musculoskeletal:  Negative for back pain and myalgias.  Skin:  Positive for wound.  Neurological:  Negative for dizziness, seizures, loss of consciousness, syncope, weakness, light-headedness, numbness and headaches.  Psychiatric/Behavioral:  Negative for agitation and confusion.   All other systems reviewed and are negative.   Physical Exam Updated Vital Signs BP 101/77   Pulse 91   Temp 97.6 F (36.4 C) (Oral)  Resp (!) 22   SpO2 96%  Physical Exam Vitals and nursing note reviewed.  Constitutional:      General: She is not in acute distress.    Appearance: She is well-developed. She is not ill-appearing, toxic-appearing or diaphoretic.  HENT:     Head: Normocephalic and atraumatic.     Nose: No congestion or rhinorrhea.     Mouth/Throat:     Mouth: Mucous membranes are moist.     Pharynx: No oropharyngeal exudate or posterior  oropharyngeal erythema.  Eyes:     Extraocular Movements: Extraocular movements intact.     Conjunctiva/sclera: Conjunctivae normal.     Pupils: Pupils are equal, round, and reactive to light.  Cardiovascular:     Rate and Rhythm: Normal rate and regular rhythm.     Heart sounds: Murmur heard.  Pulmonary:     Effort: Pulmonary effort is normal. No respiratory distress.     Breath sounds: Rhonchi and rales present. No wheezing.  Chest:     Chest wall: No tenderness.  Abdominal:     General: Abdomen is flat.     Palpations: Abdomen is soft.     Tenderness: There is no abdominal tenderness.  Musculoskeletal:        General: Tenderness present. No swelling.     Cervical back: Neck supple. No tenderness.     Right lower leg: Edema present.     Left lower leg: Edema present.  Skin:    General: Skin is warm and dry.     Capillary Refill: Capillary refill takes less than 2 seconds.     Findings: Erythema present.  Neurological:     General: No focal deficit present.     Mental Status: She is alert.     Sensory: No sensory deficit.     Motor: No weakness.  Psychiatric:        Mood and Affect: Mood normal.     ED Results / Procedures / Treatments   Labs (all labs ordered are listed, but only abnormal results are displayed) Labs Reviewed  CBC WITH DIFFERENTIAL/PLATELET - Abnormal; Notable for the following components:      Result Value   RDW 17.7 (*)    All other components within normal limits  COMPREHENSIVE METABOLIC PANEL - Abnormal; Notable for the following components:   Sodium 132 (*)    CO2 21 (*)    Glucose, Bld 113 (*)    BUN 42 (*)    Creatinine, Ser 1.88 (*)    Albumin  2.6 (*)    Alkaline Phosphatase 266 (*)    Total Bilirubin 2.6 (*)    GFR, Estimated 31 (*)    All other components within normal limits  BRAIN NATRIURETIC PEPTIDE - Abnormal; Notable for the following components:   B Natriuretic Peptide 929.3 (*)    All other components within normal limits   TROPONIN I (HIGH SENSITIVITY) - Abnormal; Notable for the following components:   Troponin I (High Sensitivity) 21 (*)    All other components within normal limits  CULTURE, BLOOD (ROUTINE X 2)  CULTURE, BLOOD (ROUTINE X 2)  LACTIC ACID, PLASMA  LACTIC ACID, PLASMA  URINALYSIS, W/ REFLEX TO CULTURE (INFECTION SUSPECTED)  TROPONIN I (HIGH SENSITIVITY)    EKG EKG Interpretation Date/Time:  Friday March 18 2023 16:01:16 EST Ventricular Rate:  96 PR Interval:  144 QRS Duration:  98 QT Interval:  349 QTC Calculation: 441 R Axis:   111  Text Interpretation: Sinus rhythm Left atrial enlargement  Low voltage, extremity and precordial leads Probable right ventricular hypertrophy when compared to prior, more artirfact. No STEMI Confirmed by Ginger Barefoot ([id number]) on 03/18/2023 4:08:56 PM  Radiology VAS US  LOWER EXTREMITY VENOUS (DVT) (ONLY [facility] & [facility]) Result Date: 03/18/2023  Lower Venous DVT Study Patient Name:  KARRIS DEANGELO  Date of Exam:   03/18/2023 Medical Rec #: [id number]            Accession #:    [id number] Date of Birth: 08/03/1965            Patient Gender: F Patient Age:   8 years Exam Location:  [facility] Procedure:      VAS US  LOWER EXTREMITY VENOUS (DVT) Referring Phys: LONNI GINGER --------------------------------------------------------------------------------  Indications: Swelling, and Edema.  Limitations: Body habitus, poor ultrasound/tissue interface and pain intolerance. Comparison Study: Previous study of right lower extremity on 11.20.2020. Performing Technologist: Edilia Elden Appl  Examination Guidelines: A complete evaluation includes B-mode imaging, spectral Doppler, color Doppler, and power Doppler as needed of all accessible portions of each vessel. Bilateral testing is considered an integral part of a complete examination. Limited examinations for reoccurring indications may be performed as noted. The reflux portion of the exam is performed with the  patient in reverse Trendelenburg.  +---------+---------------+---------+-----------+----------+-------------------+ RIGHT    CompressibilityPhasicitySpontaneityPropertiesThrombus Aging      +---------+---------------+---------+-----------+----------+-------------------+ CFV      Full           No       Yes                                      +---------+---------------+---------+-----------+----------+-------------------+ SFJ      Full           No       No                                       +---------+---------------+---------+-----------+----------+-------------------+ FV Prox  Full                                                             +---------+---------------+---------+-----------+----------+-------------------+ FV Mid                  Yes      Yes                  No well visualized,                                                       patent by color and                                                       doppler.            +---------+---------------+---------+-----------+----------+-------------------+ FV DistalFull  Yes      Yes                                      +---------+---------------+---------+-----------+----------+-------------------+ PFV                                                   Not well visualized +---------+---------------+---------+-----------+----------+-------------------+ POP      Full           No       Yes                                      +---------+---------------+---------+-----------+----------+-------------------+ PTV      Full                                                             +---------+---------------+---------+-----------+----------+-------------------+ PERO     Full                                                             +---------+---------------+---------+-----------+----------+-------------------+    +---------+---------------+---------+-----------+----------+--------------+ LEFT     CompressibilityPhasicitySpontaneityPropertiesThrombus Aging +---------+---------------+---------+-----------+----------+--------------+ CFV      Full           Yes      Yes                                 +---------+---------------+---------+-----------+----------+--------------+ SFJ      Full           Yes      Yes                                 +---------+---------------+---------+-----------+----------+--------------+ FV Prox  Full                                                        +---------+---------------+---------+-----------+----------+--------------+ FV Mid   Full           Yes      Yes                                 +---------+---------------+---------+-----------+----------+--------------+ FV DistalFull           Yes      Yes                                 +---------+---------------+---------+-----------+----------+--------------+ PFV      Full                                                        +---------+---------------+---------+-----------+----------+--------------+  POP      Full           Yes      Yes                                 +---------+---------------+---------+-----------+----------+--------------+ PTV      Full                                                        +---------+---------------+---------+-----------+----------+--------------+ PERO     Full                                                        +---------+---------------+---------+-----------+----------+--------------+     Summary: RIGHT: - There is no evidence of deep vein thrombosis in the lower extremity. However, portions of this examination were limited- see technologist comments above.  - No cystic structure found in the popliteal fossa.  LEFT: - There is no evidence of deep vein thrombosis in the lower extremity.  - No cystic structure found in the popliteal  fossa.  *See table(s) above for measurements and observations.    Preliminary    DG Chest Portable 1 View Result Date: 03/18/2023 CLINICAL DATA:  Worsening peripheral edema with hypotension and fatigue EXAM: PORTABLE CHEST 1 VIEW COMPARISON:  Chest radiograph dated 01/19/2007 FINDINGS: Slightly low lung volumes. Diffuse mild interstitial opacities. Bibasilar patchy opacities and dense left retrocardiac opacity. Questionable blunting of bilateral costophrenic angles. No pneumothorax. Enlarged cardiomediastinal silhouette. No acute osseous abnormality. IMPRESSION: 1. Diffuse mild interstitial opacities, which may represent pulmonary edema. 2. Bibasilar patchy opacities and dense left retrocardiac opacity, which may represent atelectasis, aspiration, or pneumonia. 3. Questionable blunting of bilateral costophrenic angles, which may represent small pleural effusions. 4. Cardiomegaly. Electronically Signed   By: Limin  Xu M.D.   On: 03/18/2023 17:06    Procedures Procedures    Medications Ordered in ED Medications  doxycycline  (VIBRA -TABS) tablet 100 mg (100 mg Oral Given 03/18/23 2045)    ED Course/ Medical Decision Making/ A&P                                 Medical Decision Making Amount and/or Complexity of Data Reviewed Labs: ordered. Radiology: ordered.  Risk Prescription drug management.    Sarah Macdonald is a 58 y.o. 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  According patient, for the last few months has had wounds on her legs that have been fairly well-managed.  She reports that her last few days they have worsened and now she has foul-smelling discharge from her legs and wounds.  She reports her legs have rapidly gotten bigger bilaterally and she is having some  soreness in them.  She denies any fevers or chills but agrees with a new smell.  EMS reportedly found her to be hypotensive with a blood pressure of 86/56.  She says she is  not feeling like she is going to pass out or lightheaded but does agree feeling very fatigued and tired.  She reports she has not been able to ambulate today due to lack of energy.  She was post see her PCP today to discuss the worsening fatigue and worsened edema but cannot make it to the appointment.  Patient otherwise denies fevers, chills, congestion, cough, chest pain, palpitations.  She denies any nausea, vomiting, constipation, diarrhea or urinary changes.  She reports she does take a diuretic but does say she has been drinking lots of fluids because she is thirsty.  Denies any other urinary changes and denies any trauma.  Denies any falls.  On exam, patient has legs that were wrapped in gauze bandages.  They were removed and patient has edema in both legs.  She has foul-smelling wounds on both legs that have some purulence.  They were slightly tender around them but I do not appreciate any fluctuance.  There was no crepitance.  Distally she had intact send strength, sensation, and pulses.  No tenderness of the knees or hips.  No abdominal or chest tenderness.  She does have a murmur.  She has some rales and rhonchi in her lungs bilaterally.  Otherwise patient well-appearing.  Blood pressure was in the 90s.  Clinically I am concerned about CHF exacerbation and fluid overload versus new cellulitis in her legs.  Less concern for thromboembolic disease given her lack of chest pain palpitations or shortness of breath.  Will get DVT ultrasound however given her history of DVT and not being on blood thinners.  Will get screening labs.  Given her soft pressures and my clinical concern for cellulitis in the legs, will get blood cultures and labs.  Will hold on IV rehydration as I am concerned about fluid overload and we will check a  BNP.  Anticipate admission for further management after workup is completed.  6:51 PM Workup continues to return.  Patient's BNP is over 900 and her creatinine has worsened to 1.88.  Lactic acid is normal and troponin is 21.  She does not have leukocytosis or anemia at this time.   Chest x-ray showed concern for fluid overload and possible pneumonia.  Clinically unconcerned about cellulitis developing in her legs, worsening fluid overload/heart failure, and now possibly pneumonia.  Patient refused IV placement and is refusing admission.  We described that she needs admission for safe diuresis in the setting of AKI, needs antibiotics for both suspected pneumonia and cellulitis of the legs but patient is refusing.  She says she would like oral antibiotics and would like to go home and follow-up with the wound team.  We discussed that I anticipate when the wound team sees her they will send her to the emergency department for IV antibiotics and admission but patient is refusing admission.  Will give her a dose of oral antibiotics and a prescription for oral antibiotics and she will continue her outpatient diuresis plan.  I am concerned about her kidney function as well.  Patient is aware of this.  She will be leaving AGAINST MEDICAL ADVICE for outpatient management of her problems.  Patient will leave AGAINST MEDICAL ADVICE with a prescription for antibiotics after her legs were wrapped.        Final Clinical Impression(s) / ED Diagnoses Final diagnoses:  Cellulitis of lower extremity, unspecified laterality  Hypotension, unspecified hypotension type  Pneumonia due to infectious organism, unspecified laterality, unspecified part of lung  AKI (acute kidney injury) (  HCC)  LFT elevation    Rx / DC Orders ED Discharge Orders          Ordered    doxycycline  (VIBRAMYCIN ) 100 MG capsule  2 times daily        03/18/23 2346            Clinical Impression: 1. Cellulitis of lower  extremity, unspecified laterality   2. Hypotension, unspecified hypotension type   3. Pneumonia due to infectious organism, unspecified laterality, unspecified part of lung   4. AKI (acute kidney injury) (HCC)   5. LFT elevation     Disposition: AMA    Discharge Medication List as of 03/18/2023 11:46 PM     START taking these medications   Details  doxycycline  (VIBRAMYCIN ) 100 MG capsule Take 1 capsule (100 mg total) by mouth 2 (two) times daily., Starting Fri 03/18/2023, Print        Follow Up: [facility] [address] [place] [address] [place]  [id number] [phone] Schedule an appointment as soon as possible for a visit    [facility] Emergency Department at [facility] [address] [place]  [phone]        Lamira Borin, Lonni PARAS, MD 03/19/23 [phone]

## 2023-03-18 NOTE — ED Notes (Signed)
 Pt's BL legs cleansed with NS, dressed with gauze and ace bandage.

## 2023-03-18 NOTE — ED Notes (Signed)
 Patient adamant she does not want IV, that she will only allow phlebotomy sticks

## 2023-03-22 ENCOUNTER — Ambulatory Visit: Payer: Medicaid Other | Admitting: Student

## 2023-03-23 LAB — CULTURE, BLOOD (ROUTINE X 2)
Culture: NO GROWTH
Culture: NO GROWTH

## 2023-03-24 ENCOUNTER — Telehealth: Payer: Self-pay

## 2023-03-24 NOTE — Telephone Encounter (Signed)
Received VM from patient requesting returned call to [phone].  Returned call to patient. She did not answer, LVM asking her to return call to office.   Veronda Prude, RN

## 2023-03-25 ENCOUNTER — Ambulatory Visit: Payer: Medicaid Other | Admitting: Student

## 2023-03-25 NOTE — Telephone Encounter (Signed)
Patient LVM on nurse line requesting returned call.   Called patient back at provided number. She did not answer, VM left asking her to return call to office.   Veronda Prude, RN

## 2023-03-25 NOTE — Telephone Encounter (Signed)
Patient calls nurse line leaving VM asking for a return call.   She reports she is unable to make her apt this morning.   I attempted to call her back at the number she provided.  However, no answer, VM left.

## 2023-03-28 ENCOUNTER — Encounter: Payer: Self-pay | Admitting: Family Medicine

## 2023-03-28 ENCOUNTER — Telehealth: Payer: Self-pay

## 2023-03-28 DIAGNOSIS — I872 Venous insufficiency (chronic) (peripheral): Secondary | ICD-10-CM

## 2023-03-28 NOTE — Addendum Note (Signed)
Addended by: Glendale Chard on: 03/28/2023 02:15 PM   Modules accepted: Orders

## 2023-03-28 NOTE — Telephone Encounter (Signed)
Patient calls nurse line regarding follow up from ED visit on 03/18/23.   She is asking if one of our nurses can come into her home to provide wound care. Advised that she would need follow up appointment on her wounds.   Patient reports that legs are in same dressings that were applied on 03/18/23. Advised patient of multiple missed appointments last week. Patient states that she did not have transportation.   Per chart, patient is being dismissed from our practice.   Spoke with Shanda Bumps regarding patient. Advised that I inform patient that we would call her back regarding follow up care.   Her best contact number is: [phone].  Veronda Prude, RN

## 2023-03-28 NOTE — Progress Notes (Signed)
PCP recommended dismissal from the practice due to multiple missed appointments. The dismissal letter was printed and handed over to Vea to process as a certified letter. I verified the names and addresses on the letters and envelopes for each patient and advised Vea to do the same before mailing the letters.

## 2023-03-28 NOTE — Telephone Encounter (Signed)
Returned call to patient. She did not answer, LVM asking that she return call to office.   Veronda Prude, RN

## 2023-03-28 NOTE — Progress Notes (Signed)
PCP requested dismissal for multiple no-shows to appointments.

## 2023-03-28 NOTE — Telephone Encounter (Signed)
Referral to wound care ordered.  Glendale Chard, DO [facility], PGY-2 03/28/23 2:15 PM

## 2023-03-31 ENCOUNTER — Emergency Department (HOSPITAL_COMMUNITY)
Admission: EM | Admit: 2023-03-31 | Discharge: 2023-03-31 | Disposition: A | Discharge status: Home / Self Care | Payer: Medicaid Other | Attending: Emergency Medicine | Admitting: Emergency Medicine

## 2023-03-31 ENCOUNTER — Emergency Department (HOSPITAL_COMMUNITY): Payer: Medicaid Other

## 2023-03-31 ENCOUNTER — Other Ambulatory Visit: Payer: Self-pay

## 2023-03-31 ENCOUNTER — Encounter (HOSPITAL_COMMUNITY): Payer: Self-pay

## 2023-03-31 DIAGNOSIS — Z87891 Personal history of nicotine dependence: Secondary | ICD-10-CM | POA: Insufficient documentation

## 2023-03-31 DIAGNOSIS — R600 Localized edema: Secondary | ICD-10-CM | POA: Insufficient documentation

## 2023-03-31 DIAGNOSIS — R0989 Other specified symptoms and signs involving the circulatory and respiratory systems: Secondary | ICD-10-CM | POA: Diagnosis not present

## 2023-03-31 DIAGNOSIS — Z4800 Encounter for change or removal of nonsurgical wound dressing: Secondary | ICD-10-CM | POA: Diagnosis not present

## 2023-03-31 DIAGNOSIS — I10 Essential (primary) hypertension: Secondary | ICD-10-CM | POA: Insufficient documentation

## 2023-03-31 DIAGNOSIS — E119 Type 2 diabetes mellitus without complications: Secondary | ICD-10-CM | POA: Insufficient documentation

## 2023-03-31 DIAGNOSIS — R609 Edema, unspecified: Secondary | ICD-10-CM | POA: Diagnosis not present

## 2023-03-31 DIAGNOSIS — M7989 Other specified soft tissue disorders: Secondary | ICD-10-CM | POA: Diagnosis not present

## 2023-03-31 DIAGNOSIS — Z4801 Encounter for change or removal of surgical wound dressing: Secondary | ICD-10-CM | POA: Diagnosis not present

## 2023-03-31 DIAGNOSIS — Z5189 Encounter for other specified aftercare: Secondary | ICD-10-CM

## 2023-03-31 LAB — CBC WITH DIFFERENTIAL/PLATELET
Abs Immature Granulocytes: 0.01 10*3/uL (ref 0.00–0.07)
Basophils Absolute: 0 10*3/uL (ref 0.0–0.1)
Basophils Relative: 1 %
Eosinophils Absolute: 0.1 10*3/uL (ref 0.0–0.5)
Eosinophils Relative: 1 %
HCT: 43.7 % (ref 36.0–46.0)
Hemoglobin: 14.4 g/dL (ref 12.0–15.0)
Immature Granulocytes: 0 %
Lymphocytes Relative: 19 %
Lymphs Abs: 0.8 10*3/uL (ref 0.7–4.0)
MCH: 32.5 pg (ref 26.0–34.0)
MCHC: 33 g/dL (ref 30.0–36.0)
MCV: 98.6 fL (ref 80.0–100.0)
Monocytes Absolute: 0.2 10*3/uL (ref 0.1–1.0)
Monocytes Relative: 5 %
Neutro Abs: 3.3 10*3/uL (ref 1.7–7.7)
Neutrophils Relative %: 74 %
Platelets: 349 10*3/uL (ref 150–400)
RBC: 4.43 MIL/uL (ref 3.87–5.11)
RDW: 17.8 % — ABNORMAL HIGH (ref 11.5–15.5)
WBC: 4.5 10*3/uL (ref 4.0–10.5)
nRBC: 0 % (ref 0.0–0.2)

## 2023-03-31 LAB — COMPREHENSIVE METABOLIC PANEL
ALT: 21 U/L (ref 0–44)
AST: 28 U/L (ref 15–41)
Albumin: 2.7 g/dL — ABNORMAL LOW (ref 3.5–5.0)
Alkaline Phosphatase: 261 U/L — ABNORMAL HIGH (ref 38–126)
Anion gap: 13 (ref 5–15)
BUN: 32 mg/dL — ABNORMAL HIGH (ref 6–20)
CO2: 21 mmol/L — ABNORMAL LOW (ref 22–32)
Calcium: 9.4 mg/dL (ref 8.9–10.3)
Chloride: 104 mmol/L (ref 98–111)
Creatinine, Ser: 1.08 mg/dL — ABNORMAL HIGH (ref 0.44–1.00)
GFR, Estimated: 60 mL/min — ABNORMAL LOW (ref >60)
Glucose, Bld: 106 mg/dL — ABNORMAL HIGH (ref 70–99)
Potassium: 4.4 mmol/L (ref 3.5–5.1)
Sodium: 138 mmol/L (ref 135–145)
Total Bilirubin: 2.5 mg/dL — ABNORMAL HIGH (ref 0.0–1.2)
Total Protein: 7.5 g/dL (ref 6.5–8.1)

## 2023-03-31 LAB — TROPONIN I (HIGH SENSITIVITY): Troponin I (High Sensitivity): 15 ng/L (ref <18)

## 2023-03-31 LAB — BRAIN NATRIURETIC PEPTIDE: B Natriuretic Peptide: 1069.2 pg/mL — ABNORMAL HIGH (ref 0.0–100.0)

## 2023-03-31 MED ORDER — ACETAMINOPHEN 325 MG PO TABS
650.0000 mg | ORAL_TABLET | Freq: Once | ORAL | Status: AC
  Administered 2023-03-31: 650 mg via ORAL
  Filled 2023-03-31: qty 2

## 2023-03-31 NOTE — ED Triage Notes (Signed)
Pt arrives via EMS from home. Pt arrives AxOx4. She has hx of chf, reports she has been taking her diuretic as directed. She states she is solely here to have the dressings on her legs changed. Swelling and weeping noted. Pt denies any other associated symptoms.

## 2023-03-31 NOTE — Discharge Instructions (Signed)
Sarah Macdonald:  Thank you for allowing [place] to take care of you today.  We hope you begin feeling better soon.  To-Do: Please follow-up with wound care at your appointment for 3/10. Also follow up with your appointment for vascular surgery on 3/7. Please return to the Emergency Department or call 911 if you experience chest pain, shortness of breath, severe pain, severe fever, altered mental status, or have any reason to think that you need emergency medical care.  Thank you again.  Hope you feel better soon.  Department of Emergency Medicine [facility]

## 2023-03-31 NOTE — ED Notes (Signed)
Patient only allowed 1 stick for blood work, refused an IV for blood work and possible IV antibiotic therapy and or fluids for the wounds on her legs. Only 1 set of blood cultures obtained, Denton Lank, MD notified.

## 2023-03-31 NOTE — ED Notes (Signed)
Wrapped patients legs.

## 2023-03-31 NOTE — ED Provider Notes (Signed)
[name] EMERGENCY DEPARTMENT AT [facility] Provider Note  Arrival date/time:03/31/2023 7:23 PM  HPI/ROS   Sarah Macdonald is a 58 y.o. female with PMH significant for HTN, HLD, prior DVT's not on AC, T2DM who presents for wound dressing change.  History is provided by patient.  Patient presenting due to bilateral lower extremity leg wounds.  She endorsed that she was seen here last week for similar he was given antibiotics and sent home.  She is here today to get her dressings changed. However she is still not been able to walk ever since that time.  She lives at home and gets help from her family members at home.  She feels that she has not had adequate help at home Patient endorses is that the wounds are not new and she does not feel that they have worsened.  She feels that her legs actually look better than last week after taking her antibiotic.  She called ambulance today because she just "wants her legs to get better." She endorses that she does have an appointment with wound care coming up in the next month.  Denies fevers, chills, chest pain, shortness of breath, abdominal pain, nausea or vomiting  A complete ROS was performed with pertinent positives/negatives noted above.   ED Course and Medical Decision Making   I personally reviewed the patient's vitals.  Assessment/Plan: This is a 58 year old patient who is presenting with bilateral lower extremity pain and concern for persistent wounds on the lower extremities  Patient was recently seen here on 03/18/2023 for similar. She had blood cultures at that time that were negative.  Also concern for AKI possibly in the setting of volume overload as well as lower extremity cellulitis and possible pneumonia.  There was plan to admit patient upon last visit, however patient refused at that time and left AMA and was prescribed doxycycline.  On exam today, she does not appear in any acute distress and her vitals are within  normal limits.  She is endorsing persistent pain in her bilateral calves and has scattered ulcerations of the lower extremities that have good granulation tissue.  No surrounding erythema or warmth to suggest ongoing cellulitis.  Workup: Her CMP shows no electrolyte derangement.  Creatinine is 1.08, which is improved from last week and seems to be near patient's baseline.  Her alk phos is 261, which is chronically elevated. Her CBC shows no leukocytosis or anemia BNP is 1000, which is stable from last week. Troponin is within normal limits at 15. We collected 1 set of blood cultures. Her chest x-ray shows improvement from last week with no obvious consolidations to suggest pneumonia.  I discussed possible options for patient admission versus discharge home.  Patient would prefer to be discharged home and feels that she has adequate help at home.  She also has wound care follow-up next month on 04/18/2023.  She mainly wanted to get her leg wounds rewrapped here today. Given that patient's workup is improved from last week and she does not appear septic, I believe plan for discharge home is reasonable.  I advised patient to return to the emergency department with any concern of worsening of her symptoms or other acute concerns.  I advised her to continue taking her doxycycline as it seems to be improving her lower extremity cellulitis. Patient voiced understanding and agreement.   Disposition:  I discussed the plan for discharge with the patient and/or their surrogate at bedside prior to discharge and they were in  agreement with the plan and verbalized understanding of the return precautions provided. All questions answered to the best of my ability. Ultimately, the patient was discharged in stable condition with stable vital signs. I am reassured that they are capable of close follow up and good social support at home.   Clinical Impression:  1. Visit for wound check     Rx / DC Orders ED  Discharge Orders     None       The plan for this patient was discussed with Dr. Denton Lank, who voiced agreement and who oversaw evaluation and treatment of this patient.   Clinical Complexity A medically appropriate history, review of systems, and physical exam was performed.  Patient's presentation is most consistent with acute presentation with potential threat to life or bodily function.  Medical Decision Making Amount and/or Complexity of Data Reviewed Labs: ordered. Radiology: ordered.  Risk OTC drugs.    Physical Exam and Medical History   Vitals:   03/31/23 1352 03/31/23 1700 03/31/23 1751 03/31/23 1845  BP: 105/86 (!) 177/161    Pulse: 89 89  99  Resp: 16 18  19   Temp: 97.6 F (36.4 C)  98 F (36.7 C)   TempSrc: Oral     SpO2: 95% 98%  100%    Physical Exam Vitals and nursing note reviewed.  Constitutional:      General: She is not in acute distress.    Appearance: She is well-developed.  HENT:     Head: Normocephalic and atraumatic.  Eyes:     Conjunctiva/sclera: Conjunctivae normal.  Cardiovascular:     Rate and Rhythm: Normal rate and regular rhythm.  Pulmonary:     Effort: Pulmonary effort is normal. No respiratory distress.     Breath sounds: Normal breath sounds.  Abdominal:     Palpations: Abdomen is soft.     Tenderness: There is no abdominal tenderness.  Musculoskeletal:        General: No swelling.     Cervical back: Neck supple.     Right lower leg: Edema present.     Left lower leg: Edema present.     Comments: Scattered ulcers to BLE, non-purulent   Skin:    General: Skin is warm and dry.     Capillary Refill: Capillary refill takes less than 2 seconds.  Neurological:     Mental Status: She is alert.  Psychiatric:        Mood and Affect: Mood normal.     Medical History: Allergies  Allergen Reactions   Cortisone Swelling   Past Medical History:  Diagnosis Date   Diabetes mellitus without complication (HCC)    DVT (deep  venous thrombosis) (HCC)    Hyperlipidemia    Hypertension     History reviewed. No pertinent surgical history. Family History  Problem Relation Age of Onset   Hypertension Mother    Hypertension Father    Hyperlipidemia Sister     Social History   Tobacco Use   Smoking status: Former    Current packs/day: 0.00    Types: Cigarettes    Quit date: 01/25/2012    Years since quitting: 11.1   Smokeless tobacco: Never  Vaping Use   Vaping status: Never Used  Substance Use Topics   Alcohol use: No   Drug use: No    Procedures   If procedures were preformed on this patient, they are listed below:  Procedures   -------- HPI and MDM generated using voice dictation software  and may contain dictation errors. Please contact me for any clarification or with any questions.   Cephus Slater, MD Emergency Medicine PGY-2    Caron Presume, MD 03/31/23 Margretta Ditty    Cathren Laine, MD 04/01/23 [phone]

## 2023-04-05 LAB — CULTURE, BLOOD (ROUTINE X 2)
Culture: NO GROWTH
Special Requests: ADEQUATE

## 2023-04-08 ENCOUNTER — Other Ambulatory Visit: Payer: Self-pay | Admitting: *Deleted

## 2023-04-08 DIAGNOSIS — R0989 Other specified symptoms and signs involving the circulatory and respiratory systems: Secondary | ICD-10-CM

## 2023-04-12 ENCOUNTER — Ambulatory Visit: Payer: Medicaid Other | Admitting: Student

## 2023-04-12 ENCOUNTER — Encounter: Payer: Self-pay | Admitting: Student

## 2023-04-12 VITALS — BP 120/93 | HR 104 | Ht 66.0 in | Wt 190.4 lb

## 2023-04-12 DIAGNOSIS — I83008 Varicose veins of unspecified lower extremity with ulcer other part of lower leg: Secondary | ICD-10-CM | POA: Diagnosis not present

## 2023-04-12 DIAGNOSIS — I872 Venous insufficiency (chronic) (peripheral): Secondary | ICD-10-CM

## 2023-04-12 DIAGNOSIS — E785 Hyperlipidemia, unspecified: Secondary | ICD-10-CM

## 2023-04-12 DIAGNOSIS — I10 Essential (primary) hypertension: Secondary | ICD-10-CM

## 2023-04-12 DIAGNOSIS — L97802 Non-pressure chronic ulcer of other part of unspecified lower leg with fat layer exposed: Secondary | ICD-10-CM

## 2023-04-12 DIAGNOSIS — R200 Anesthesia of skin: Secondary | ICD-10-CM | POA: Diagnosis not present

## 2023-04-12 DIAGNOSIS — Z7984 Long term (current) use of oral hypoglycemic drugs: Secondary | ICD-10-CM

## 2023-04-12 DIAGNOSIS — R202 Paresthesia of skin: Secondary | ICD-10-CM | POA: Diagnosis not present

## 2023-04-12 DIAGNOSIS — L97902 Non-pressure chronic ulcer of unspecified part of unspecified lower leg with fat layer exposed: Secondary | ICD-10-CM | POA: Insufficient documentation

## 2023-04-12 DIAGNOSIS — E119 Type 2 diabetes mellitus without complications: Secondary | ICD-10-CM

## 2023-04-12 MED ORDER — METFORMIN HCL ER 500 MG PO TB24: 1000.0000 mg | ORAL_TABLET | Freq: Two times a day (BID) | ORAL | 3 refills | Status: DC

## 2023-04-12 MED ORDER — LOSARTAN POTASSIUM 100 MG PO TABS
100.0000 mg | ORAL_TABLET | Freq: Every day | ORAL | 3 refills | Status: DC
Start: 2023-04-12 — End: 2023-04-21

## 2023-04-12 MED ORDER — DOXYCYCLINE HYCLATE 100 MG PO TABS: 100.0000 mg | ORAL_TABLET | Freq: Two times a day (BID) | ORAL | 0 refills | Status: DC

## 2023-04-12 MED ORDER — DULOXETINE HCL 60 MG PO CPEP: 60.0000 mg | ORAL_CAPSULE | Freq: Every day | ORAL | 3 refills | Status: DC

## 2023-04-12 MED ORDER — ACETAMINOPHEN 500 MG PO TABS
1000.0000 mg | ORAL_TABLET | Freq: Once | ORAL | Status: AC
  Administered 2023-04-12: 1000 mg via ORAL

## 2023-04-12 MED ORDER — EMPAGLIFLOZIN 25 MG PO TABS: 25.0000 mg | ORAL_TABLET | Freq: Every day | ORAL | 3 refills | Status: DC

## 2023-04-12 MED ORDER — ROSUVASTATIN CALCIUM 40 MG PO TABS: 40.0000 mg | ORAL_TABLET | Freq: Every day | ORAL | 3 refills | Status: DC

## 2023-04-12 NOTE — Patient Instructions (Addendum)
 It was great to see you today!   You need an appointment early next week. Please go to the following appointments as listed below:   Future Appointments  Date Time Provider Department Center  04/15/2023 11:00 AM [facility]  04/15/2023 12:00 PM Daria Pastures, MD [facility]  04/18/2023  9:00 AM Duanne Guess, MD [facility]    Please arrive 15 minutes before your appointment to ensure smooth check in process.    Please call the clinic at [phone] if your symptoms worsen or you have any concerns.  Thank you for allowing me to participate in your care, Dr. Glendale Chard [facility]

## 2023-04-12 NOTE — Progress Notes (Signed)
 SUBJECTIVE:   CHIEF COMPLAINT / HPI:   Sarah Macdonald is a 58 y.o. female  presenting for wound care for chronic venous stasis ulcers. She was recently seen at the [facility] ED on 03/31/23. Her bandages were changes. She was offered admission but adamantly declined.   Today she is feeling better. Denies fever, chills, purulent drainage. She has not changed her bandages since they were placed in the ED. She reports serous drainage from the wounds that is so bad they make a puddle when she sits down. She finished her last course of doxycyline 2 weeks ago. She does not have wound care at home. She was unable to walk 2 weeks ago but reports this has improved and she can now walk with a walker.   Social: she lives with her brother. Her family helps provide care. She is working on getting reliable transportation to the office. She reports not having transportation has limited her on receiving care which resulted in her missing several appointments at our office.   PERTINENT  PMH / PSH: Reviewed and updated   OBJECTIVE:   BP (!) 120/93   Pulse (!) 104   Ht 5\' 6"  (1.676 m)   Wt 190 lb 6.4 oz (86.4 kg)   SpO2 (!) 81%   BMI 30.73 kg/m   Chronically ill-appearing, no acute distress Cardio: Regular rate, regular rhythm, no murmurs on exam. Pulm: Clear, no wheezing, no crackles. No increased work of breathing Abdominal: bowel sounds present, soft, non-tender, non-distended Neuro: alert and oriented x3, speech normal in content, no facial asymmetry  Extremities: severe venous stasis ulcers with serous drainage. Multiple open wounds located on the back of her legs bilaterally, some with depths >1 cm. She is able to feel pain with palpation. Pulses not able to be palpated and limited exam due to pain. No purulent drainage noted. Granulating tissue present.   See pictures in Media.      03/07/2023    3:04 PM 03/01/2023    3:54 PM 02/25/2023    9:16 AM  PHQ9 SCORE ONLY  PHQ-9 Total Score 0 0 4       ASSESSMENT/PLAN:   Venous stasis ulcer with fat layer exposed (HCC) Severe ulcers and wounds on exam. Reassuring that patient is feeling better and denies systemic symptoms. Due to severity of her wounds recommended referral to the emergency department for possible admission for wound care and vascular surgery consult. Patient refused.   Wound care provided in the office. Applied bacitracin ointment to open sores, non-stick pads and wrapped with an ace bandage. Patient educated on wound care and instructed to change her dressings every 2-3 days and avoid getting them wet. Patient struggles with reliable transportation and her mobility is limited. She would benefit from home health wound care since she is adamant about not returning to the hospital. Overall poor prognosis and will most likely end with bilateral amputation if appropriate care is not able to be preformed.   Patient to follow up in 1 week for dressing changes and wound check. Prescribed 10 day course of doxycycline. Please remind patient of appointment to vascular surgery.    Low O2 Reading:  Oxygen saturation was measured several times in the office with highest recorded of 81% with second machine. Of note, patient denies SOB, chest tightness, dizziness. She reports feeling very well. She was able to ambulate without signs of respiratory distress. Her hands were cold on exam. Doubt that this is an accurate reading. She  has no history of lung disorders/diseases and her O2 saturation has been WNL in the past. Suspect element of poor perfusion distorting reading.   Glendale Chard, DO [facility]

## 2023-04-12 NOTE — Assessment & Plan Note (Signed)
 Severe ulcers and wounds on exam. Reassuring that patient is feeling better and denies systemic symptoms. Due to severity of her wounds recommended referral to the emergency department for possible admission for wound care and vascular surgery consult. Patient refused.   Wound care provided in the office. Applied bacitracin ointment to open sores, non-stick pads and wrapped with an ace bandage. Patient educated on wound care and instructed to change her dressings every 2-3 days and avoid getting them wet. Patient struggles with reliable transportation and her mobility is limited. She would benefit from home health wound care since she is adamant about not returning to the hospital. Overall poor prognosis and will most likely end with bilateral amputation if appropriate care is not able to be preformed.   Patient to follow up in 1 week for dressing changes and wound check. Prescribed 10 day course of doxycycline. Please remind patient of appointment to vascular surgery.

## 2023-04-14 NOTE — Progress Notes (Deleted)
 Patient ID: Sarah Macdonald, female   DOB: [birth year], 58 y.o.   MRN: [id number]  Reason for Consult: No chief complaint on file.   Referred by Doreene Eland, MD  Subjective:     HPI  Sarah Macdonald is a 57 y.o. female presenting for evaluation of bilateral lower extremity wounds. ***  Past Medical History:  Diagnosis Date   Diabetes mellitus without complication (HCC)    DVT (deep venous thrombosis) (HCC)    Hyperlipidemia    Hypertension    Family History  Problem Relation Age of Onset   Hypertension Mother    Hypertension Father    Hyperlipidemia Sister    No past surgical history on file.  Short Social History:  Social History   Tobacco Use   Smoking status: Former    Current packs/day: 0.00    Types: Cigarettes    Quit date: 01/25/2012    Years since quitting: 11.2   Smokeless tobacco: Never  Substance Use Topics   Alcohol use: No    Allergies  Allergen Reactions   Cortisone Swelling    Current Outpatient Medications  Medication Sig Dispense Refill   acetaminophen (TYLENOL) 500 MG tablet Take 2 tablets (1,000 mg total) by mouth every 8 (eight) hours as needed. 120 tablet 3   Blood Pressure Monitoring (BLOOD PRESSURE MONITOR AUTOMAT) DEVI 1 kit by Does not apply route daily. Please call the office if you have any issues obtaining this monitor. 1 each 0   diclofenac Sodium (VOLTAREN) 1 % GEL Apply 2 g topically 4 (four) times daily. 50 g 0   doxycycline (VIBRA-TABS) 100 MG tablet Take 1 tablet (100 mg total) by mouth 2 (two) times daily for 10 days. 20 tablet 0   DULoxetine (CYMBALTA) 60 MG capsule Take 1 capsule (60 mg total) by mouth daily. 30 capsule 3   empagliflozin (JARDIANCE) 25 MG TABS tablet Take 1 tablet (25 mg total) by mouth daily. 90 tablet 3   furosemide (LASIX) 40 MG tablet TAKE 1 TABLET(40 MG) BY MOUTH EVERY MORNING 90 tablet 3   losartan (COZAAR) 100 MG tablet Take 1 tablet (100 mg total) by mouth daily. 90 tablet 3    metFORMIN (GLUCOPHAGE-XR) 500 MG 24 hr tablet Take 2 tablets (1,000 mg total) by mouth 2 (two) times daily with a meal. 180 tablet 3   Multiple Vitamin (MULTIVITAMIN) capsule Take 1 capsule by mouth daily. (Patient not taking: Reported on 02/06/2023) 30 capsule 11   rosuvastatin (CRESTOR) 40 MG tablet Take 1 tablet (40 mg total) by mouth daily. 90 tablet 3   triamcinolone (KENALOG) 0.025 % cream Apply 1 Application topically daily as needed. 80 g 1   urea (CARMOL) 10 % cream APPLY 1 APPLICATION TOPICALLY DAILY 170 g 2   No current facility-administered medications for this visit.    REVIEW OF SYSTEMS  Positive for ***  All other systems were reviewed and are negative     Objective:  Objective   There were no vitals filed for this visit. There is no height or weight on file to calculate BMI.  Physical Exam General: no acute distress Cardiac: hemodynamically stable Pulm: normal work of breathing Abdomen: non-tender, no pulsatile mass*** Neuro: alert, no focal deficit Extremities: no edema, cyanosis or wounds*** Vascular:   Right: ***  Left: ***   Data: ABI ***  CMP reviewed, creatinine 1.08  Bilateral lower extremity DVT study reviewed Negative for DVT     Assessment/Plan:  Sarah Macdonald is a 58 y.o. female ***    Recommendations to optimize cardiovascular risk: Abstinence from all tobacco products. Blood glucose control with goal A1c < 7%. Blood pressure control with goal blood pressure < 140/90 mmHg. Lipid reduction therapy with goal LDL-C <100 mg/dL  Aspirin 81mg  PO QD.  Atorvastatin 40-80mg  PO QD (or other "high intensity" statin therapy).     Daria Pastures MD [facility]

## 2023-04-15 ENCOUNTER — Ambulatory Visit (HOSPITAL_COMMUNITY): Payer: Medicaid Other

## 2023-04-15 ENCOUNTER — Encounter: Payer: Medicaid Other | Admitting: Vascular Surgery

## 2023-04-18 ENCOUNTER — Ambulatory Visit (HOSPITAL_BASED_OUTPATIENT_CLINIC_OR_DEPARTMENT_OTHER): Payer: Medicaid Other | Admitting: General Surgery

## 2023-04-19 ENCOUNTER — Ambulatory Visit: Payer: Self-pay | Admitting: Student

## 2023-04-19 ENCOUNTER — Inpatient Hospital Stay (HOSPITAL_COMMUNITY)

## 2023-04-19 ENCOUNTER — Other Ambulatory Visit: Payer: Self-pay

## 2023-04-19 ENCOUNTER — Telehealth: Payer: Self-pay | Admitting: Student

## 2023-04-19 ENCOUNTER — Inpatient Hospital Stay (HOSPITAL_COMMUNITY)
Admission: EM | Admit: 2023-04-19 | Discharge: 2023-05-10 | DRG: 286 | Disposition: E | Attending: Internal Medicine | Admitting: Internal Medicine

## 2023-04-19 ENCOUNTER — Encounter (HOSPITAL_COMMUNITY): Payer: Self-pay | Admitting: Emergency Medicine

## 2023-04-19 DIAGNOSIS — Z5982 Transportation insecurity: Secondary | ICD-10-CM

## 2023-04-19 DIAGNOSIS — Z515 Encounter for palliative care: Secondary | ICD-10-CM

## 2023-04-19 DIAGNOSIS — A419 Sepsis, unspecified organism: Secondary | ICD-10-CM | POA: Diagnosis not present

## 2023-04-19 DIAGNOSIS — R0989 Other specified symptoms and signs involving the circulatory and respiratory systems: Secondary | ICD-10-CM | POA: Diagnosis not present

## 2023-04-19 DIAGNOSIS — J90 Pleural effusion, not elsewhere classified: Secondary | ICD-10-CM | POA: Diagnosis not present

## 2023-04-19 DIAGNOSIS — L97912 Non-pressure chronic ulcer of unspecified part of right lower leg with fat layer exposed: Secondary | ICD-10-CM | POA: Diagnosis present

## 2023-04-19 DIAGNOSIS — N170 Acute kidney failure with tubular necrosis: Secondary | ICD-10-CM | POA: Diagnosis present

## 2023-04-19 DIAGNOSIS — R160 Hepatomegaly, not elsewhere classified: Secondary | ICD-10-CM | POA: Diagnosis not present

## 2023-04-19 DIAGNOSIS — Z6830 Body mass index (BMI) 30.0-30.9, adult: Secondary | ICD-10-CM

## 2023-04-19 DIAGNOSIS — L97222 Non-pressure chronic ulcer of left calf with fat layer exposed: Secondary | ICD-10-CM | POA: Diagnosis present

## 2023-04-19 DIAGNOSIS — L304 Erythema intertrigo: Secondary | ICD-10-CM | POA: Insufficient documentation

## 2023-04-19 DIAGNOSIS — Z91199 Patient's noncompliance with other medical treatment and regimen due to unspecified reason: Secondary | ICD-10-CM

## 2023-04-19 DIAGNOSIS — I130 Hypertensive heart and chronic kidney disease with heart failure and stage 1 through stage 4 chronic kidney disease, or unspecified chronic kidney disease: Secondary | ICD-10-CM | POA: Diagnosis present

## 2023-04-19 DIAGNOSIS — L03116 Cellulitis of left lower limb: Secondary | ICD-10-CM | POA: Diagnosis not present

## 2023-04-19 DIAGNOSIS — N189 Chronic kidney disease, unspecified: Secondary | ICD-10-CM | POA: Diagnosis present

## 2023-04-19 DIAGNOSIS — S81809A Unspecified open wound, unspecified lower leg, initial encounter: Secondary | ICD-10-CM

## 2023-04-19 DIAGNOSIS — E785 Hyperlipidemia, unspecified: Secondary | ICD-10-CM | POA: Diagnosis not present

## 2023-04-19 DIAGNOSIS — I83222 Varicose veins of left lower extremity with both ulcer of calf and inflammation: Secondary | ICD-10-CM | POA: Diagnosis present

## 2023-04-19 DIAGNOSIS — Z79899 Other long term (current) drug therapy: Secondary | ICD-10-CM

## 2023-04-19 DIAGNOSIS — R531 Weakness: Principal | ICD-10-CM | POA: Diagnosis present

## 2023-04-19 DIAGNOSIS — Z83438 Family history of other disorder of lipoprotein metabolism and other lipidemia: Secondary | ICD-10-CM

## 2023-04-19 DIAGNOSIS — I2781 Cor pulmonale (chronic): Secondary | ICD-10-CM | POA: Diagnosis present

## 2023-04-19 DIAGNOSIS — K828 Other specified diseases of gallbladder: Secondary | ICD-10-CM | POA: Diagnosis not present

## 2023-04-19 DIAGNOSIS — R627 Adult failure to thrive: Secondary | ICD-10-CM | POA: Diagnosis present

## 2023-04-19 DIAGNOSIS — I83009 Varicose veins of unspecified lower extremity with ulcer of unspecified site: Secondary | ICD-10-CM | POA: Diagnosis present

## 2023-04-19 DIAGNOSIS — I700 Atherosclerosis of aorta: Secondary | ICD-10-CM | POA: Diagnosis not present

## 2023-04-19 DIAGNOSIS — E872 Acidosis, unspecified: Secondary | ICD-10-CM | POA: Diagnosis present

## 2023-04-19 DIAGNOSIS — I83019 Varicose veins of right lower extremity with ulcer of unspecified site: Secondary | ICD-10-CM | POA: Diagnosis present

## 2023-04-19 DIAGNOSIS — Z789 Other specified health status: Secondary | ICD-10-CM

## 2023-04-19 DIAGNOSIS — I10 Essential (primary) hypertension: Secondary | ICD-10-CM | POA: Diagnosis not present

## 2023-04-19 DIAGNOSIS — Z1152 Encounter for screening for COVID-19: Secondary | ICD-10-CM | POA: Diagnosis not present

## 2023-04-19 DIAGNOSIS — Z888 Allergy status to other drugs, medicaments and biological substances status: Secondary | ICD-10-CM

## 2023-04-19 DIAGNOSIS — R918 Other nonspecific abnormal finding of lung field: Secondary | ICD-10-CM | POA: Diagnosis not present

## 2023-04-19 DIAGNOSIS — I517 Cardiomegaly: Secondary | ICD-10-CM | POA: Diagnosis not present

## 2023-04-19 DIAGNOSIS — E162 Hypoglycemia, unspecified: Secondary | ICD-10-CM | POA: Diagnosis not present

## 2023-04-19 DIAGNOSIS — E8729 Other acidosis: Secondary | ICD-10-CM | POA: Diagnosis not present

## 2023-04-19 DIAGNOSIS — R011 Cardiac murmur, unspecified: Secondary | ICD-10-CM | POA: Diagnosis present

## 2023-04-19 DIAGNOSIS — L89310 Pressure ulcer of right buttock, unstageable: Secondary | ICD-10-CM | POA: Diagnosis present

## 2023-04-19 DIAGNOSIS — Z7982 Long term (current) use of aspirin: Secondary | ICD-10-CM

## 2023-04-19 DIAGNOSIS — E11649 Type 2 diabetes mellitus with hypoglycemia without coma: Secondary | ICD-10-CM | POA: Diagnosis present

## 2023-04-19 DIAGNOSIS — R000 Tachycardia, unspecified: Secondary | ICD-10-CM | POA: Diagnosis not present

## 2023-04-19 DIAGNOSIS — E669 Obesity, unspecified: Secondary | ICD-10-CM | POA: Diagnosis present

## 2023-04-19 DIAGNOSIS — M79661 Pain in right lower leg: Secondary | ICD-10-CM | POA: Diagnosis not present

## 2023-04-19 DIAGNOSIS — I3139 Other pericardial effusion (noninflammatory): Secondary | ICD-10-CM | POA: Diagnosis present

## 2023-04-19 DIAGNOSIS — I2489 Other forms of acute ischemic heart disease: Secondary | ICD-10-CM | POA: Diagnosis present

## 2023-04-19 DIAGNOSIS — N179 Acute kidney failure, unspecified: Secondary | ICD-10-CM | POA: Insufficient documentation

## 2023-04-19 DIAGNOSIS — K089 Disorder of teeth and supporting structures, unspecified: Secondary | ICD-10-CM | POA: Diagnosis present

## 2023-04-19 DIAGNOSIS — Z7984 Long term (current) use of oral hypoglycemic drugs: Secondary | ICD-10-CM

## 2023-04-19 DIAGNOSIS — E119 Type 2 diabetes mellitus without complications: Secondary | ICD-10-CM | POA: Diagnosis not present

## 2023-04-19 DIAGNOSIS — Z86718 Personal history of other venous thrombosis and embolism: Secondary | ICD-10-CM

## 2023-04-19 DIAGNOSIS — M7732 Calcaneal spur, left foot: Secondary | ICD-10-CM | POA: Diagnosis not present

## 2023-04-19 DIAGNOSIS — E1151 Type 2 diabetes mellitus with diabetic peripheral angiopathy without gangrene: Secondary | ICD-10-CM | POA: Diagnosis present

## 2023-04-19 DIAGNOSIS — Z8249 Family history of ischemic heart disease and other diseases of the circulatory system: Secondary | ICD-10-CM

## 2023-04-19 DIAGNOSIS — R0682 Tachypnea, not elsewhere classified: Secondary | ICD-10-CM | POA: Diagnosis not present

## 2023-04-19 DIAGNOSIS — R001 Bradycardia, unspecified: Secondary | ICD-10-CM | POA: Diagnosis not present

## 2023-04-19 DIAGNOSIS — R570 Cardiogenic shock: Secondary | ICD-10-CM | POA: Diagnosis present

## 2023-04-19 DIAGNOSIS — F32A Depression, unspecified: Secondary | ICD-10-CM | POA: Diagnosis present

## 2023-04-19 DIAGNOSIS — R109 Unspecified abdominal pain: Secondary | ICD-10-CM | POA: Diagnosis not present

## 2023-04-19 DIAGNOSIS — Z66 Do not resuscitate: Secondary | ICD-10-CM | POA: Diagnosis not present

## 2023-04-19 DIAGNOSIS — I2724 Chronic thromboembolic pulmonary hypertension: Secondary | ICD-10-CM | POA: Diagnosis present

## 2023-04-19 DIAGNOSIS — R7989 Other specified abnormal findings of blood chemistry: Secondary | ICD-10-CM | POA: Insufficient documentation

## 2023-04-19 DIAGNOSIS — E1122 Type 2 diabetes mellitus with diabetic chronic kidney disease: Secondary | ICD-10-CM | POA: Diagnosis present

## 2023-04-19 DIAGNOSIS — R0689 Other abnormalities of breathing: Secondary | ICD-10-CM | POA: Diagnosis not present

## 2023-04-19 DIAGNOSIS — R7401 Elevation of levels of liver transaminase levels: Secondary | ICD-10-CM | POA: Insufficient documentation

## 2023-04-19 DIAGNOSIS — L97902 Non-pressure chronic ulcer of unspecified part of unspecified lower leg with fat layer exposed: Secondary | ICD-10-CM | POA: Diagnosis present

## 2023-04-19 DIAGNOSIS — R739 Hyperglycemia, unspecified: Secondary | ICD-10-CM | POA: Diagnosis not present

## 2023-04-19 DIAGNOSIS — I82403 Acute embolism and thrombosis of unspecified deep veins of lower extremity, bilateral: Secondary | ICD-10-CM | POA: Diagnosis not present

## 2023-04-19 DIAGNOSIS — F05 Delirium due to known physiological condition: Secondary | ICD-10-CM | POA: Diagnosis not present

## 2023-04-19 DIAGNOSIS — M7989 Other specified soft tissue disorders: Secondary | ICD-10-CM | POA: Diagnosis not present

## 2023-04-19 DIAGNOSIS — L97802 Non-pressure chronic ulcer of other part of unspecified lower leg with fat layer exposed: Secondary | ICD-10-CM

## 2023-04-19 DIAGNOSIS — I272 Pulmonary hypertension, unspecified: Secondary | ICD-10-CM | POA: Diagnosis not present

## 2023-04-19 DIAGNOSIS — S91002A Unspecified open wound, left ankle, initial encounter: Secondary | ICD-10-CM | POA: Diagnosis not present

## 2023-04-19 DIAGNOSIS — R946 Abnormal results of thyroid function studies: Secondary | ICD-10-CM | POA: Diagnosis present

## 2023-04-19 DIAGNOSIS — L03115 Cellulitis of right lower limb: Secondary | ICD-10-CM | POA: Diagnosis present

## 2023-04-19 DIAGNOSIS — R188 Other ascites: Secondary | ICD-10-CM | POA: Diagnosis not present

## 2023-04-19 DIAGNOSIS — R590 Localized enlarged lymph nodes: Secondary | ICD-10-CM | POA: Diagnosis not present

## 2023-04-19 DIAGNOSIS — R0902 Hypoxemia: Secondary | ICD-10-CM | POA: Diagnosis not present

## 2023-04-19 DIAGNOSIS — Z87891 Personal history of nicotine dependence: Secondary | ICD-10-CM

## 2023-04-19 DIAGNOSIS — E161 Other hypoglycemia: Secondary | ICD-10-CM | POA: Diagnosis not present

## 2023-04-19 DIAGNOSIS — Z452 Encounter for adjustment and management of vascular access device: Secondary | ICD-10-CM | POA: Diagnosis not present

## 2023-04-19 DIAGNOSIS — R6521 Severe sepsis with septic shock: Secondary | ICD-10-CM | POA: Diagnosis not present

## 2023-04-19 DIAGNOSIS — I5084 End stage heart failure: Secondary | ICD-10-CM | POA: Diagnosis present

## 2023-04-19 DIAGNOSIS — I50813 Acute on chronic right heart failure: Secondary | ICD-10-CM | POA: Diagnosis present

## 2023-04-19 DIAGNOSIS — I872 Venous insufficiency (chronic) (peripheral): Secondary | ICD-10-CM

## 2023-04-19 LAB — COMPREHENSIVE METABOLIC PANEL
ALT: 45 U/L — ABNORMAL HIGH (ref 0–44)
AST: 99 U/L — ABNORMAL HIGH (ref 15–41)
Albumin: 2.6 g/dL — ABNORMAL LOW (ref 3.5–5.0)
Alkaline Phosphatase: 247 U/L — ABNORMAL HIGH (ref 38–126)
Anion gap: 13 (ref 5–15)
BUN: 37 mg/dL — ABNORMAL HIGH (ref 6–20)
CO2: 17 mmol/L — ABNORMAL LOW (ref 22–32)
Calcium: 8.7 mg/dL — ABNORMAL LOW (ref 8.9–10.3)
Chloride: 100 mmol/L (ref 98–111)
Creatinine, Ser: 2.21 mg/dL — ABNORMAL HIGH (ref 0.44–1.00)
GFR, Estimated: 25 mL/min — ABNORMAL LOW (ref >60)
Glucose, Bld: 70 mg/dL (ref 70–99)
Potassium: 5.1 mmol/L (ref 3.5–5.1)
Sodium: 130 mmol/L — ABNORMAL LOW (ref 135–145)
Total Bilirubin: 2.6 mg/dL — ABNORMAL HIGH (ref 0.0–1.2)
Total Protein: 7 g/dL (ref 6.5–8.1)

## 2023-04-19 LAB — CBG MONITORING, ED
Glucose-Capillary: 79 mg/dL (ref 70–99)
Glucose-Capillary: <10 mg/dL — CL (ref 70–99)
Glucose-Capillary: 39 mg/dL — CL (ref 70–99)
Glucose-Capillary: 37 mg/dL — CL (ref 70–99)
Glucose-Capillary: 74 mg/dL (ref 70–99)
Glucose-Capillary: 174 mg/dL — ABNORMAL HIGH (ref 70–99)
Glucose-Capillary: 147 mg/dL — ABNORMAL HIGH (ref 70–99)

## 2023-04-19 LAB — CBC WITH DIFFERENTIAL/PLATELET
Abs Immature Granulocytes: 0.1 10*3/uL — ABNORMAL HIGH (ref 0.00–0.07)
Band Neutrophils: 41 %
Basophils Absolute: 0 10*3/uL (ref 0.0–0.1)
Basophils Relative: 0 %
Eosinophils Absolute: 0 10*3/uL (ref 0.0–0.5)
Eosinophils Relative: 0 %
HCT: 45.4 % (ref 36.0–46.0)
Hemoglobin: 15.2 g/dL — ABNORMAL HIGH (ref 12.0–15.0)
Lymphocytes Relative: 6 %
Lymphs Abs: 0.8 10*3/uL (ref 0.7–4.0)
MCH: 31.8 pg (ref 26.0–34.0)
MCHC: 33.5 g/dL (ref 30.0–36.0)
MCV: 95 fL (ref 80.0–100.0)
Monocytes Absolute: 0.3 10*3/uL (ref 0.1–1.0)
Monocytes Relative: 2 %
Myelocytes: 1 %
Neutro Abs: 11.4 10*3/uL — ABNORMAL HIGH (ref 1.7–7.7)
Neutrophils Relative %: 50 %
Platelets: 392 10*3/uL (ref 150–400)
RBC: 4.78 MIL/uL (ref 3.87–5.11)
RDW: 16.8 % — ABNORMAL HIGH (ref 11.5–15.5)
Smear Review: NORMAL
WBC Morphology: INCREASED
WBC: 12.5 10*3/uL — ABNORMAL HIGH (ref 4.0–10.5)
nRBC: 0 % (ref 0.0–0.2)

## 2023-04-19 LAB — I-STAT CG4 LACTIC ACID, ED
Lactic Acid, Venous: 2.3 mmol/L (ref 0.5–1.9)
Lactic Acid, Venous: 4.5 mmol/L (ref 0.5–1.9)

## 2023-04-19 LAB — HEMOGLOBIN A1C
Hgb A1c MFr Bld: 7.4 % — ABNORMAL HIGH (ref 4.8–5.6)
Mean Plasma Glucose: 165.68 mg/dL

## 2023-04-19 LAB — RESP PANEL BY RT-PCR (RSV, FLU A&B, COVID)  RVPGX2
Influenza A by PCR: NEGATIVE
Influenza B by PCR: NEGATIVE
Resp Syncytial Virus by PCR: NEGATIVE
SARS Coronavirus 2 by RT PCR: NEGATIVE

## 2023-04-19 LAB — MAGNESIUM: Magnesium: 1.7 mg/dL (ref 1.7–2.4)

## 2023-04-19 LAB — BRAIN NATRIURETIC PEPTIDE: B Natriuretic Peptide: 1037.9 pg/mL — ABNORMAL HIGH (ref 0.0–100.0)

## 2023-04-19 LAB — TROPONIN I (HIGH SENSITIVITY)
Troponin I (High Sensitivity): 52 ng/L — ABNORMAL HIGH (ref <18)
Troponin I (High Sensitivity): 49 ng/L — ABNORMAL HIGH (ref <18)

## 2023-04-19 LAB — TSH: TSH: 5.684 u[IU]/mL — ABNORMAL HIGH (ref 0.350–4.500)

## 2023-04-19 LAB — HIV ANTIBODY (ROUTINE TESTING W REFLEX): HIV Screen 4th Generation wRfx: NONREACTIVE

## 2023-04-19 MED ORDER — DEXTROSE 50 % IV SOLN: 1.0000 | Freq: Once | INTRAVENOUS | Status: DC

## 2023-04-19 MED ORDER — DEXTROSE 50 % IV SOLN
INTRAVENOUS | Status: AC
  Filled 2023-04-19: qty 50

## 2023-04-19 MED ORDER — ADULT MULTIVITAMIN W/MINERALS CH: 1.0000 | ORAL_TABLET | Freq: Every day | ORAL | Status: DC

## 2023-04-19 MED ORDER — DEXTROSE-SODIUM CHLORIDE 5-0.9 % IV SOLN: INTRAVENOUS | Status: DC

## 2023-04-19 MED ORDER — MULTIVITAMINS PO CAPS: 1.0000 | ORAL_CAPSULE | Freq: Every day | ORAL | Status: DC

## 2023-04-19 MED ORDER — SODIUM CHLORIDE 0.9 % IV SOLN
2.0000 g | Freq: Once | INTRAVENOUS | Status: AC
  Administered 2023-04-19: 2 g via INTRAVENOUS
  Filled 2023-04-19: qty 12.5

## 2023-04-19 MED ORDER — LACTATED RINGERS IV BOLUS
1000.0000 mL | Freq: Once | INTRAVENOUS | Status: AC
  Administered 2023-04-19: 1000 mL via INTRAVENOUS

## 2023-04-19 MED ORDER — DEXTROSE 10 % IV SOLN: INTRAVENOUS | Status: DC

## 2023-04-19 MED ORDER — ENOXAPARIN SODIUM 40 MG/0.4ML IJ SOSY: 40.0000 mg | PREFILLED_SYRINGE | INTRAMUSCULAR | Status: DC

## 2023-04-19 MED ORDER — ACETAMINOPHEN 650 MG RE SUPP: 650.0000 mg | Freq: Four times a day (QID) | RECTAL | Status: DC | PRN

## 2023-04-19 MED ORDER — DEXTROSE 50 % IV SOLN
1.0000 | Freq: Once | INTRAVENOUS | Status: AC
  Administered 2023-04-19: 50 mL via INTRAVENOUS

## 2023-04-19 MED ORDER — VANCOMYCIN HCL 1750 MG/350ML IV SOLN
1750.0000 mg | Freq: Once | INTRAVENOUS | Status: AC
  Administered 2023-04-19: 1750 mg via INTRAVENOUS
  Filled 2023-04-19: qty 350

## 2023-04-19 MED ORDER — ACETAMINOPHEN 325 MG PO TABS: 650.0000 mg | ORAL_TABLET | Freq: Four times a day (QID) | ORAL | Status: DC | PRN

## 2023-04-19 MED ORDER — DEXTROSE 50 % IV SOLN
1.0000 | Freq: Once | INTRAVENOUS | Status: AC
  Administered 2023-04-19: 50 mL via INTRAVENOUS
  Filled 2023-04-19: qty 50

## 2023-04-19 MED ORDER — ROSUVASTATIN CALCIUM 20 MG PO TABS: 40.0000 mg | ORAL_TABLET | Freq: Every day | ORAL | Status: DC

## 2023-04-19 MED ORDER — NYSTATIN 100000 UNIT/GM EX POWD
Freq: Two times a day (BID) | CUTANEOUS | Status: DC
  Filled 2023-04-19: qty 15

## 2023-04-19 MED ORDER — DULOXETINE HCL 60 MG PO CPEP: 60.0000 mg | ORAL_CAPSULE | Freq: Every day | ORAL | Status: DC

## 2023-04-19 NOTE — ED Notes (Signed)
 Paged [place] to RN Mimi

## 2023-04-19 NOTE — Progress Notes (Signed)
 ED Pharmacy Antibiotic Sign Off An antibiotic consult was received from an ED provider for cefepime and vancomycin per pharmacy dosing for sepsis. A chart review was completed to assess appropriateness.   The following one time order(s) were placed:  Cefepime 2 g IV  Vancomycin 1750 mg IV   Further antibiotic and/or antibiotic pharmacy consults should be ordered by the admitting provider if indicated.   Thank you for allowing pharmacy to be a part of this patient's care.   Lennie Muckle, PharmD PGY1 Pharmacy Resident 04/19/2023 6:05 PM

## 2023-04-19 NOTE — ED Triage Notes (Signed)
 Pt to ER via EMS with c/o of profound weakness.  Pt also has bilateral weeping ulcerations of lower extremities.  Pt initial CBG 51, pt given 15gm of oral glucose and repeat was 507.  Pt hypotensive for EMS.

## 2023-04-19 NOTE — Assessment & Plan Note (Signed)
 Noted on exam, unclear etiology.  Documented in clinic notes on chart review. -Follow up STAT TTE

## 2023-04-19 NOTE — Assessment & Plan Note (Signed)
 Likely prerenal in setting of sepsis. -IVF with D5NS as above -Strict I&Os -Caution with nephrotoxic medications -AM CMP, trend creatinine

## 2023-04-19 NOTE — Assessment & Plan Note (Signed)
 Noted beneath pannus and in perineal region. -Nystatin powder topically BID

## 2023-04-19 NOTE — Assessment & Plan Note (Signed)
 52>49, flat.  Likely demand ischemia in setting of sepsis.

## 2023-04-19 NOTE — Plan of Care (Signed)
 Received page for admission.  Received report from Dr. Jearld Fenton.   58 year old female, well-known to family practice, presenting with: Hypoglycemia, transaminitis, elevated BNP, meeting SIRS criteria and bilateral lower extremity wounds likely infected.  Glucose of 50 by EMS, patient received oral glucose with EMS, had an elevated blood sugar reading (500), repeat was 70 at the ED.  Started on D10 drip.  Elevated BNP, echo had been ordered prior but never done-chronically elevated BNP per chart review but no official diagnosis of CHF.  Patient was received vancomycin and cefepime for possible SIRS related to bilateral lower extremity infections.  Vital signs are stable, received report that patient is stable.  Admitting team will come to see patient shortly after signout.

## 2023-04-19 NOTE — ED Provider Notes (Signed)
 [facility] EMERGENCY DEPARTMENT AT [facility] Provider Note   CSN: [id number] Arrival date & time: 04/19/23  1540     History  Chief Complaint  Patient presents with   Weakness   Wound Check    Sarah Macdonald is a 58 y.o. female with PMH as listed below who presents to ER via EMS with c/o of profound weakness. Pt also has bilateral weeping ulcerations of lower extremities which are very painful. Hasn't yet seen wound care but states she has an upcoming appointment with them. She denies f/c, cough, N/V, abd pain, SOB, falls, head trauma, urinary sxs.   With EMS, initial CBG 51 mg/dL, pt given 40JW of oral glucose and repeat was 507 mg/dL. Has history of type 2 diabetes and takes metformin and Jardiance. She states she is eating okay recently but "can't hold that much."    Per chart review was seen on 04/12/2023 for chronic venous insufficiency by family medicine.  Reported high-volume serous drainage from wounds but no purulent drainage.  Was walking with a walker.  Was also seen in the emergency department on 03/31/2023, had trouble ambulating, and was offered admission but adamantly declined and was DC'd w/ doxycycline.  Past Medical History:  Diagnosis Date   Diabetes mellitus without complication (HCC)    DVT (deep venous thrombosis) (HCC)    Hyperlipidemia    Hypertension        Home Medications Prior to Admission medications   Medication Sig Start Date End Date Taking? Authorizing Provider  acetaminophen (TYLENOL) 500 MG tablet Take 2 tablets (1,000 mg total) by mouth every 8 (eight) hours as needed. 09/21/19  Yes Brimage, Vondra, DO  doxycycline (VIBRA-TABS) 100 MG tablet Take 1 tablet (100 mg total) by mouth 2 (two) times daily for 10 days. 04/12/23 04/22/23 Yes Glendale Chard, DO  DULoxetine (CYMBALTA) 60 MG capsule Take 1 capsule (60 mg total) by mouth daily. 04/12/23  Yes Glendale Chard, DO  empagliflozin (JARDIANCE) 25 MG TABS tablet Take 1 tablet (25 mg total)  by mouth daily. 04/12/23  Yes Glendale Chard, DO  furosemide (LASIX) 40 MG tablet TAKE 1 TABLET(40 MG) BY MOUTH EVERY MORNING 11/19/22  Yes Lockie Mola, MD  losartan (COZAAR) 100 MG tablet Take 1 tablet (100 mg total) by mouth daily. 04/12/23  Yes Glendale Chard, DO  metFORMIN (GLUCOPHAGE-XR) 500 MG 24 hr tablet Take 2 tablets (1,000 mg total) by mouth 2 (two) times daily with a meal. 04/12/23  Yes Glendale Chard, DO  Multiple Vitamin (MULTIVITAMIN) capsule Take 1 capsule by mouth daily. 01/02/19  Yes Mirian Mo, MD  rosuvastatin (CRESTOR) 40 MG tablet Take 1 tablet (40 mg total) by mouth daily. 04/12/23  Yes Glendale Chard, DO  triamcinolone (KENALOG) 0.025 % cream Apply 1 Application topically daily as needed. 01/04/23  Yes Lincoln Brigham, MD  Blood Pressure Monitoring (BLOOD PRESSURE MONITOR AUTOMAT) DEVI 1 kit by Does not apply route daily. Please call the office if you have any issues obtaining this monitor. 06/18/19   Melene Plan, MD      Allergies    Cortisone    Review of Systems   Review of Systems A 10 point review of systems was performed and is negative unless otherwise reported in HPI.  Physical Exam Updated Vital Signs BP (!) 68/51   Pulse 93   Temp (!) 96.7 F (35.9 C) (Axillary)   Resp (!) 26   Ht 5\' 6"  (1.676 m)   Wt 86.2 kg  SpO2 100%   BMI 30.67 kg/m  Physical Exam General: Chronically ill-appearing female, lying in bed.  HEENT: PERRLA, Sclera anicteric, MMM, trachea midline.  Cardiology: mild regular tachycardia, no murmurs/rubs/gallops.  Resp: Normal respiratory rate and effort. CTAB, no wheezes, rhonchi, crackles.  Abd: Soft, non-tender, non-distended. No rebound tenderness or guarding.  GU: Deferred. MSK: Severe swelling of BL LEs. Diffuse and numerous ulcerations throughout BL LEs, worst on posterior sides. LEs are cool bilaterally with pulses difficult to palpate d/t swelling. Serous drainage noted.  Skin: warm, dry.  Neuro: A&Ox4, CNs II-XII grossly intact.  MAEs. Sensation grossly intact.  Psych: Normal mood and affect.   ED Results / Procedures / Treatments   Labs (all labs ordered are listed, but only abnormal results are displayed) Labs Reviewed  CBC WITH DIFFERENTIAL/PLATELET - Abnormal; Notable for the following components:      Result Value   WBC 12.5 (*)    Hemoglobin 15.2 (*)    RDW 16.8 (*)    Neutro Abs 11.4 (*)    Abs Immature Granulocytes 0.10 (*)    All other components within normal limits  COMPREHENSIVE METABOLIC PANEL - Abnormal; Notable for the following components:   Sodium 130 (*)    CO2 17 (*)    BUN 37 (*)    Creatinine, Ser 2.21 (*)    Calcium 8.7 (*)    Albumin 2.6 (*)    AST 99 (*)    ALT 45 (*)    Alkaline Phosphatase 247 (*)    Total Bilirubin 2.6 (*)    GFR, Estimated 25 (*)    All other components within normal limits  BRAIN NATRIURETIC PEPTIDE - Abnormal; Notable for the following components:   B Natriuretic Peptide 1,037.9 (*)    All other components within normal limits  TSH - Abnormal; Notable for the following components:   TSH 5.684 (*)    All other components within normal limits  HEMOGLOBIN A1C - Abnormal; Notable for the following components:   Hgb A1c MFr Bld 7.4 (*)    All other components within normal limits  CBG MONITORING, ED - Abnormal; Notable for the following components:   Glucose-Capillary <10 (*)    All other components within normal limits  I-STAT CG4 LACTIC ACID, ED - Abnormal; Notable for the following components:   Lactic Acid, Venous 2.3 (*)    All other components within normal limits  I-STAT CG4 LACTIC ACID, ED - Abnormal; Notable for the following components:   Lactic Acid, Venous 4.5 (*)    All other components within normal limits  CBG MONITORING, ED - Abnormal; Notable for the following components:   Glucose-Capillary 39 (*)    All other components within normal limits  CBG MONITORING, ED - Abnormal; Notable for the following components:   Glucose-Capillary 37  (*)    All other components within normal limits  CBG MONITORING, ED - Abnormal; Notable for the following components:   Glucose-Capillary 174 (*)    All other components within normal limits  CBG MONITORING, ED - Abnormal; Notable for the following components:   Glucose-Capillary 147 (*)    All other components within normal limits  TROPONIN I (HIGH SENSITIVITY) - Abnormal; Notable for the following components:   Troponin I (High Sensitivity) 52 (*)    All other components within normal limits  TROPONIN I (HIGH SENSITIVITY) - Abnormal; Notable for the following components:   Troponin I (High Sensitivity) 49 (*)    All other components within normal  limits  RESP PANEL BY RT-PCR (RSV, FLU A&B, COVID)  RVPGX2  CULTURE, BLOOD (ROUTINE X 2)  CULTURE, BLOOD (ROUTINE X 2)  MAGNESIUM  HIV ANTIBODY (ROUTINE TESTING W REFLEX)  URINALYSIS, ROUTINE W REFLEX MICROSCOPIC  CBC  MAGNESIUM  COMPREHENSIVE METABOLIC PANEL  LACTIC ACID, PLASMA  LACTIC ACID, PLASMA  CBG MONITORING, ED  CBG MONITORING, ED    EKG EKG Interpretation Date/Time:  Tuesday April 19 2023 16:35:57 EDT Ventricular Rate:  104 PR Interval:  143 QRS Duration:  102 QT Interval:  324 QTC Calculation: 427 R Axis:   178  Text Interpretation: Sinus tachycardia Left atrial enlargement Low voltage, extremity and precordial leads Right ventricular hypertrophy Confirmed by Vivi Barrack [phone]) on 04/19/2023 4:52:16 PM  Radiology No results found.  Procedures .Critical Care  Performed by: Loetta Rough, MD Authorized by: Loetta Rough, MD   Critical care provider statement:    Critical care time (minutes):  35   Critical care was necessary to treat or prevent imminent or life-threatening deterioration of the following conditions:  Sepsis and endocrine crisis   Critical care was time spent personally by me on the following activities:  Development of treatment plan with patient or surrogate, discussions with consultants,  evaluation of patient's response to treatment, examination of patient, ordering and review of laboratory studies, ordering and review of radiographic studies, ordering and performing treatments and interventions, pulse oximetry, re-evaluation of patient's condition, review of old charts and obtaining history from patient or surrogate   Care discussed with: admitting provider       Medications Ordered in ED Medications  rosuvastatin (CRESTOR) tablet 40 mg (has no administration in time range)  DULoxetine (CYMBALTA) DR capsule 60 mg (has no administration in time range)  acetaminophen (TYLENOL) tablet 650 mg (has no administration in time range)    Or  acetaminophen (TYLENOL) suppository 650 mg (has no administration in time range)  enoxaparin (LOVENOX) injection 40 mg (has no administration in time range)  multivitamin with minerals tablet 1 tablet (has no administration in time range)  dextrose 5 %-0.9 % sodium chloride infusion ( Intravenous New Bag/Given 04/19/23 2057)  nystatin (MYCOSTATIN/NYSTOP) topical powder ( Topical Given 04/19/23 2128)  lactated ringers bolus 1,000 mL (0 mLs Intravenous Stopped 04/19/23 2001)  ceFEPIme (MAXIPIME) 2 g in sodium chloride 0.9 % 100 mL IVPB (0 g Intravenous Stopped 04/19/23 2001)  vancomycin (VANCOREADY) IVPB 1750 mg/350 mL (0 mg Intravenous Stopped 04/19/23 2308)  dextrose 50 % solution 50 mL (50 mLs Intravenous Given 04/19/23 2004)  dextrose 50 % solution 50 mL (50 mLs Intravenous Given 04/19/23 2108)    ED Course/ Medical Decision Making/ A&P                          Medical Decision Making Amount and/or Complexity of Data Reviewed Labs: ordered. Decision-making details documented in ED Course.  Risk Prescription drug management. Decision regarding hospitalization.    This patient presents to the ED for concern of gen weakness, leg wounds/weeping, hypoglycemia, this involves an extensive number of treatment options, and is a complaint that  carries with it a high risk of complications and morbidity.  I considered the following differential and admission for this acute, potentially life threatening condition.   MDM:    DDX for generalized weakness includes but is not limited to:  Infectious processes such as UTI, though she denies urinary sxs. Denies resp sxs to indicate PNA. Does have an AKI  noted, given some fluid resuscitation. No significant electrolyte derangements or metabolic abnormalities. No chest pain or EKG changes to indicate ischemia/ACS. She does have an elevated BNP which seems c/w few weeks ago. Low c/f intracranial/central processes. Noted to be hypoglycemic, which was reported to be 50 and then 500 after oral glucose with EMS, which seems to be an inconsistent response. Then was 70s here, which is decreased more than it should be, started D10 drip for hypoglycemia. Possibly due to oral antihyperglycemics with decreased PO intake vs critical illness such as sepsis. She is also tachypneic, tachycardic on arrival, and with leukocytosis and open leg wounds, started on IV abx and blood cultures drawn.    Clinical Course as of 04/19/23 2346  Tue Apr 19, 2023  1628 Glucose-Capillary(!!): <10 Drawn from finger stick, but concerned about accuracy. Rechecking from IV. [HN]  1651 Glucose-Capillary: 79 Significantly decreased from EMS reported glucose. She is on long-acting oral antihyperglycemics. Will start D10 drip and give patient OJ.  [HN]  1653 BP soft in 90s with mild tachycardia up to 110, will give 1L LR as well [HN]  1757 AST(!): 99 [HN]  1757 ALT(!): 45 Mildly elevated LFTs with no h/o similar. Does have h/o mildly elevated T bili but not AST/ALT. Denies tylenol use or alcohol use. [HN]  1758 Creatinine(!): 2.21 +AKI with BL seems to be <1.4 [HN]  1758 Troponin I (High Sensitivity)(!): 52 Mildly elevated, will trend [HN]  1758 Resp panel by RT-PCR (RSV, Flu A&B, Covid) Anterior Nasal Swab neg [HN]  1758 WBC(!):  12.5 +leukocytosis w/ tachycardia, possible source of infection as her legs, will get blood cultures and give broad spectrum abx inc staph coverage [HN]  1823 B Natriuretic Peptide(!): 1,037.9 Elevated similar to 2 weeks ago [HN]    Clinical Course User Index [HN] Loetta Rough, MD    Labs: I Ordered, and personally interpreted labs.  The pertinent results include:  those listed above  Additional history obtained from chart review.    Cardiac Monitoring: The patient was maintained on a cardiac monitor.  I personally viewed and interpreted the cardiac monitored which showed an underlying rhythm of: sinus tachycardia  Reevaluation: After the interventions noted above, I reevaluated the patient and found that they have :improved  Social Determinants of Health: Lives independently  Disposition:  Admitted to hospitalist  Co morbidities that complicate the patient evaluation  Past Medical History:  Diagnosis Date   Diabetes mellitus without complication (HCC)    DVT (deep venous thrombosis) (HCC)    Hyperlipidemia    Hypertension      Medicines Meds ordered this encounter  Medications   DISCONTD: dextrose 50 % solution    Toney Sang A: cabinet override   DISCONTD: dextrose 10 % infusion   lactated ringers bolus 1,000 mL   ceFEPIme (MAXIPIME) 2 g in sodium chloride 0.9 % 100 mL IVPB    Antibiotic Indication::   Sepsis   vancomycin (VANCOREADY) IVPB 1750 mg/350 mL    Indication::   Sepsis   dextrose 50 % solution 50 mL   DISCONTD: dextrose 50 % solution 50 mL   rosuvastatin (CRESTOR) tablet 40 mg   DISCONTD: multivitamin capsule 1 capsule   DULoxetine (CYMBALTA) DR capsule 60 mg   OR Linked Order Group    acetaminophen (TYLENOL) tablet 650 mg    acetaminophen (TYLENOL) suppository 650 mg   enoxaparin (LOVENOX) injection 40 mg   multivitamin with minerals tablet 1 tablet   dextrose 5 %-0.9 %  sodium chloride infusion   nystatin (MYCOSTATIN/NYSTOP) topical  powder   dextrose 50 % solution 50 mL    I have reviewed the patients home medicines and have made adjustments as needed  Problem List / ED Course: Problem List Items Addressed This Visit       Endocrine   Hypoglycemia     Genitourinary   AKI (acute kidney injury) (HCC)   Other Visit Diagnoses       Generalized weakness    -  Primary     Open wounds involving multiple regions of lower extremity                       This note was created using dictation software, which may contain spelling or grammatical errors.    Loetta Rough, MD 04/19/23 [phone]

## 2023-04-19 NOTE — Assessment & Plan Note (Signed)
 Large and poorly maintained ulcers of BLE with increased purulence and suspected cellulitis of LLE, though infection could be bilateral.  Most likely source of sepsis, will need to cover for MRSA.  Does have poor pulses, bilateral pitting edema, and history of DVT. -Antibiotics as above -Bilateral ABI given poorly palpable pulses, consult Vascular Surgery if poor perfusion -Bilateral vasc US DVT -Hold home Lasix 40 mg in setting of hypotension, sepsis -Wound cleaned and dressed per RN -Wound Care consult, follow up recommendations

## 2023-04-19 NOTE — Telephone Encounter (Signed)
 Called patient to discuss missed appointment.  She reports she did not have transportation.  She has not been able to change her bandages.  She denies fever and chills.  She does not want to go to the hospital.  I advised patient that my recommendation is for her to go to the hospital due to the severity of her wounds and her being unable to care for herself at home.  She declined and wanted me to set up transportation for her to come to the office.  Will place a social work consult to hopefully get this arranged.  Patient will need to be seen a soon as possible.  Glendale Chard, DO [facility], PGY-2 04/19/23 11:40 AM

## 2023-04-19 NOTE — Assessment & Plan Note (Signed)
 Meeting SIRS criteria on admission with elevated lactate to 2.3.  Most likely etiology are BLE wounds with increased purulence. -Admit to [facility] inpatient, progressive unit, attending Dr. Linwood Dibbles -s/p 1L LR bolus and on D5NS 75 mL/hr -MAP goal >65, monitor closely -s/p vancomycin and cefepime in ED, redose tomorrow -Follow up CXR, UA -Follow blood cultures -AM CMP, CBC

## 2023-04-19 NOTE — Assessment & Plan Note (Signed)
 Mild transaminase elevation, possibly shock liver in setting of sepsis. -AM CMP, trend transaminases

## 2023-04-19 NOTE — Plan of Care (Addendum)
 [facility]MTS Interim Progress Note  S: iStat lactic acid elevated to 4.5 from 2.3. Patient states she has some chills but otherwise reports nothing is currently bothering her.   O: BP 100/70   Pulse (!) 101   Temp 98.5 F (36.9 C) (Oral)   Resp (!) 24   Ht 5\' 6"  (1.676 m)   Wt 86.2 kg   SpO2 100%   BMI 30.67 kg/m   General: Tired, ill appearing, no acute distress Neuro: A&O Cardiovascular: RRR, no murmurs, no peripheral edema Respiratory: normal WOB on RA, mildly tachypnic, CTAB, no wheezes, ronchi or rales Extremities: Moving all 4 extremities equally   A/P: Sepsis  Hypotension Concern for worsening sepsis given rising lactic acid despite gentle fluid resuscitation and initiation of antibiotics. Blood pressures are low; however, MAPs have remained above 60. Given elevated BNP and unknown cardiac function, further fluid resuscitation should be pursued with caution. Reassuringly, patient clinically remains unchanged.  - At this time will continue to monitor closely - Will repeat plasm lactic acid - Low threshold for CCM consult - ECHO, CXR pending  Celine Mans, MD 04/19/2023, 11:28 PM PGY-2, [facility] pager [phone]    ADDENDUM Paged by RN for decreasing MAPs <60. CXR complete without evidence of new vascular congestion. Ordered repeat 1L LR bolus. Consulted PCCM. Discussed case with Dr. Sherryll Burger about concern for worsening septic shock and need for pressor support. Dr. Sherryll Burger will see the patient. Also recommended consulting Vascular Surgery for possible surgical intervention. Awaiting further recs. Will consult VVS. Dr. Sharion Dove to reevaluate patient.   ADDENDUM @ 1200 Patient evaluated at bedside by Dr. Sharion Dove.  Remains clinically unchanged from initial evaluation.  Alert and oriented, though mildly drowsy.  1L LR bolus starting at time of evaluation.  BPs still soft, borderline MAP 55-60.  Consulted VVS per CCM recommendation.  Appreciate VVS  recommendations:  Dr. Randie Heinz with VVS noted likely no surgical intervention would be indicated given acuity, additionally notes it is not clear that wounds are infected based on review of chart and past images of wounds; recommending wound care at this time.  Will see patient to evaluate for revascularization.  If debridement is needed, would recommend Ortho consult.    ADDENDUM @ 0250 Appreciate Ortho consult and recommendations: Preliminary read of CT imaging not showing air in tissue, no indication for immediate debridement pending official CT read.  Will see patient in AM.  Also recommended General Surgery consult if want to look at abdomen/pelvis or lower extremity soft tissues.  Messaged Dr. Sherryll Burger with PCCM to notify of developments.

## 2023-04-19 NOTE — Assessment & Plan Note (Signed)
 BNP 1,037.9.  TTE ordered October 2024, but never completed.  No documented history of heart failure.  Suspect undiagnosed CHF.  Difficulty with fluid balance given apparent third spaced fluid/edema while also intravascularly depleted in setting of sepsis. -Follow up STAT TTE -Consult Cardiology depending on read -AM CMP, Mag, optimize electrolytes

## 2023-04-19 NOTE — H&P (Cosign Needed Addendum)
 Hospital Admission History and Physical Service Pager: [phone]  Patient name: Sarah Macdonald Medical record number: [id number] Date of Birth: January 29, 1966 Age: 58 y.o. Gender: female  Primary Care Provider: Glendale Chard, DO Consultants: None Code Status: FULL which was confirmed with patient at bedside Preferred Emergency Contact:  Contact Information     Name Relation Home Work Mobile   Macdonald,Sarah Brother [phone]        Other Contacts   None on File    Chief Complaint: Bilateral lower extremity pain  Assessment and Plan: Sarah Macdonald is a 58 y.o. female presenting with increased drainage of BLE wounds and significant pain, hypoglycemic and meeting sepsis criteria in setting of likely LE wound infection.  Leading differential for etiology of sepsis includes lower extremity cellulitis, though pulmonary and urinary etiologies also considered.  Cellulitis is likely given increased purulence as well as increased edema, rubor, and calor (particularly of LLE).  No other concerning symptoms for infection of urinary or pulmonary origin on exam or history.  However, will pursue further sepsis workup and follow up CXR and UA as well as blood cultures over the next few days.  While murmur is noted, endocarditis is less likely given no major risk factors.  From volume resuscitation standpoint, note difficult balance between third spaced volume overload in setting of suspected CHF and intravascular depletion in setting of sepsis.  Will bolus with fluids if MAPs dropping, though stable for now and responding appropriately with volume resuscitation thus far.  Will follow blood glucose and continue D5NS for now.  Hypoglycemia is most likely secondary to poor PO intake of carbohydrates at home.  Also possible that capillary blood glucose is less accurate in setting of poor perfusion and prefer venous blood glucose at this time. Assessment & Plan Sepsis (HCC) Meeting SIRS  criteria on admission with elevated lactate to 2.3.  Most likely etiology are BLE wounds with increased purulence. -Admit to FMTS inpatient, progressive unit, attending Dr. Linwood Macdonald -s/p 1L LR bolus and on D5NS 75 mL/hr -MAP goal >65, monitor closely -s/p vancomycin and cefepime in ED, redose tomorrow -Follow up CXR, UA -Follow blood cultures -AM CMP, CBC Venous stasis ulcer with fat layer exposed (HCC) Large and poorly maintained ulcers of BLE with increased purulence and suspected cellulitis of LLE, though infection could be bilateral.  Most likely source of sepsis, will need to cover for MRSA.  Does have poor pulses, bilateral pitting edema, and history of DVT. -Antibiotics as above -Bilateral ABI given poorly palpable pulses, consult Vascular Surgery if poor perfusion -Bilateral vasc US DVT -Hold home Lasix 40 mg in setting of hypotension, sepsis -Wound cleaned and dressed per RN -Wound Care consult, follow up recommendations Hypoglycemia Remains hypoglycemic to 39 on recheck while on D10 fluids.  Reportedly low intake of carbohydrates at home, likely eating poorly in setting of sepsis. Likely multifactorial. If not improving with PO intake and sepsis treatment. -s/p 1 amp D50, recheck CBG in 30 minutes, redose as needed if hypoglycemic -D5NS @ 75 mL/hr -BGs Q1h until normalized x2-3, prefer venous BG over capillary Elevated brain natriuretic peptide (BNP) level BNP 1,037.9.  TTE ordered October 2024, but never completed.  No documented history of heart failure.  Suspect undiagnosed CHF.  Difficulty with fluid balance given apparent third spaced fluid/edema while also intravascularly depleted in setting of sepsis. -Follow up STAT TTE -Consult Cardiology depending on read -AM CMP, Mag, optimize electrolytes AKI (acute kidney injury) (HCC) Likely prerenal in setting  of sepsis. -IVF with D5NS as above -Strict I&Os -Caution with nephrotoxic medications -AM CMP, trend  creatinine Diabetes mellitus without complication (HCC) Last A1c 6.5 in March 2024, repeat 7.4 today.  On home metformin 1000 mg BID, Jardiance 25 mg daily -Hold metformin inpatient and hold Jardiance in setting of hypotension, sepsis -No SSI for now in setting of hypoglycemia, reassess when improved Transaminitis Mild transaminase elevation, possibly shock liver in setting of sepsis. -AM CMP, trend transaminases Intertrigo Noted beneath pannus and in perineal region. -Nystatin powder topically BID Systolic murmur Noted on exam, unclear etiology.  Documented in clinic notes on chart review. -Follow up STAT TTE Elevated troponin 52>49, flat.  Likely demand ischemia in setting of sepsis. Hypertension Hypotensive on admission. -Hold home losartan 100 mg in setting of hypotension, sepsis Chronic health problem HLD: Continue home rosuvastatin 40 mg daily Depression: Continue home duloxetine 60 mg daily  FEN/GI: Heart Healthy/Carb Modified, D5NS @ 75 mL/hr while hypoglycemic VTE Prophylaxis: Lovenox  Disposition: Progressive  History of Present Illness:  Sarah Macdonald is a 58 y.o. female presenting with BLE pain and hypoglycemia with extensive wounds to BLE noted.   Was called by Sarah Macdonald from [facility] earlier today and instructed to go to the ED due to lower leg wounds and inability to care for herself.  Patient did not immediately go to ED, but reports she did call EMS later because her legs were hurting more than normal.  In the ED, patient was hypotensive and tachycardic, so received 1L bolus.  EKG with sinus tach.  CBGs in 31s with EMS, 79  in ED, with one reading <10, unclear if accurate.  Started on D10W.  AKI and transaminitis noted.  Troponin mildly elevated but trended flat.  RPP negative.  Lactate elevated to 2.3.  On FMTS evaluation, patient reports that her BLE pain has been worse for the past 2 weeks.  Rates pain at an 8/10.  Has noticed some increased drainage recently.   Tries to change leg wound wrappings at home every 2 days.  Uses clean gauze and Vaseline for dressing changes at home.  Reports that she does walk at home with walker, lives with adult son and brother who do assist her.  Denies feeling increasingly weak leading up to this hospital presentation.  Reports normal oral intake, 3 meals per day.  Reports she has had some shortness of breath, ongoing for 2 months.  Uses two pillows at night.  No orthopnea.  No known fevers, no chills.  No dysuria or frequency.  No cough.  No nausea, vomiting, or diarrhea. Has 3 bowel movements per day at baseline, non-bloody.  Called collateral, patient's brother Sarah Macdonald: Reports that patient has been eating fairly well and that family brings her food at home.  Eats eggs, some breads.  Does not eat any sugary foods.  Eats some rice, but not many carbs.  Drinks lots of black coffee.  Brother has not noticed the patient with any shaking, nausea, vomiting, or increased weakness.  Confirms that patient has been complaining of increased BLE pain.  Review Of Systems: Per HPI.  Pertinent Past Medical History: T2DM Hx DVT Hyperlipidemia Hypertension  Remainder reviewed in history tab.   Pertinent Past Surgical History: None  Remainder reviewed in history tab.  Pertinent Social History: Tobacco use: Former, quit 2012 Alcohol use: No Other Substance use: No Lives with at home with brother and son who are able to help her out.  Pertinent Family History: Mother -  HTN Father - HTN  Remainder reviewed in history tab.   Important Outpatient Medications: Took her medicines this morning.  Duloxetine 60 mg daily Jardiance 25 mg daily Lasix 40 mg daily Losartan 100 mg daily  Metformin 1000 mg twice daily Rosuvastatin 40 mg daily  Remainder reviewed in medication history.   Objective: BP (!) 86/65 (BP Location: Right Arm)   Pulse (!) 101   Temp (!) 97.5 F (36.4 C)   Resp 20   Ht 5\' 6"  (1.676 m)    Wt 86.2 kg   SpO2 100%   BMI 30.67 kg/m   Exam: General: Chronically ill patient resting in bed in NAD.  Alert and oriented x4. Eyes: No scleral icterus or injections.  PERRLA. ENTM: Dry mucous membranes.  Poor dentition. Neck: Jugular venous distension 3 cm below angle of jaw. Cardiovascular: Tachycardic, regular rate.  Normal S1/S2.  3/6 systolic ejection murmur.  No rubs or gallops. Respiratory: Normal WOB on room air.  CTAB. Gastrointestinal: No TTP in all quadrants, no rebound or guarding.   MSK: Moving all extremities appropriately, though limited by BLE pain. Derm: Macerated skin with confluent macular and erythematous rash under pannus and in perineal region.  Photo attached below. Extremities: RLE and LLE venous stasis ulcers with notable malodorous purulent drainage and skin breakdown to fatty layer in places.  LLE with increased rubor and calor concerning for cellulitis.  Photos attached below.  Poorly palpable DP and PT pulses of BLE, limited by edema. Neuro: Slow responses but alert and oriented and able to answer questions appropriately and follow instructions. Psych: Flat affect, some possible psychomotor slowing.   RLE   LLE   Pannus and perineum, view partially obstructed by nystatin powder  Labs:  CBC BMET  Recent Labs  Lab 04/19/23 1618  WBC 12.5*  HGB 15.2*  HCT 45.4  PLT 392   Recent Labs  Lab 04/19/23 1618  NA 130*  K 5.1  CL 100  CO2 17*  BUN 37*  CREATININE 2.21*  GLUCOSE 70  CALCIUM 8.7*     BNP 1,037.9, less than previous one 2 weeks ago at 1,069.2 Magnesium 1.7 Troponin 52 CBG 79 Lactate 2.3 Leukocytes 12.5 with neutrophilia 11.4 Covid, flu, RSV negative BCx, UA pending  EKG: Artifact in inferior leads, low voltage lateral leads. Sinus tachycardia at 104 BPM. No acute ischemic changes. QTcB 427. Overall unchanged from prior EKG 03/2023.   Imaging Studies Performed: None  Shitarev, Dimitry, MD 04/19/2023, 8:09 PM PGY-1, [facility]  [facility] Family Medicine  FPTS Intern pager: [phone], text pages welcome Secure chat group [facility] Teaching Service   I have verified that the resident's  findings are accurately documented in the resident's note. I have made edits and changes where appropriate, and agree with plan.  Celine Mans, MD, PGY-2 [facility] 10:25 PM 04/19/2023

## 2023-04-19 NOTE — Assessment & Plan Note (Signed)
 Remains hypoglycemic to 39 on recheck while on D10 fluids.  Reportedly low intake of carbohydrates at home, likely eating poorly in setting of sepsis. Likely multifactorial. If not improving with PO intake and sepsis treatment. -s/p 1 amp D50, recheck CBG in 30 minutes, redose as needed if hypoglycemic -D5NS @ 75 mL/hr -BGs Q1h until normalized x2-3, prefer venous BG over capillary

## 2023-04-19 NOTE — Assessment & Plan Note (Signed)
 Last A1c 6.5 in March 2024, repeat 7.4 today.  On home metformin 1000 mg BID, Jardiance 25 mg daily -Hold metformin inpatient and hold Jardiance in setting of hypotension, sepsis -No SSI for now in setting of hypoglycemia, reassess when improved

## 2023-04-19 NOTE — Assessment & Plan Note (Signed)
 HLD: Continue home rosuvastatin 40 mg daily Depression: Continue home duloxetine 60 mg daily

## 2023-04-19 NOTE — Hospital Course (Signed)
 Generalized weakness, hypoglycemia.   SIRS criteria, and AKI with elevated BNP too (got 1 L in the ED)  She got put on D10 drip, had glu of 50 en route, now back to 70.   Also has transaminates

## 2023-04-19 NOTE — Assessment & Plan Note (Signed)
 Hypotensive on admission. -Hold home losartan 100 mg in setting of hypotension, sepsis

## 2023-04-19 NOTE — ED Notes (Signed)
 RN Victorino Dike and MD Jearld Fenton notified of critically low CBG

## 2023-04-20 ENCOUNTER — Inpatient Hospital Stay (HOSPITAL_COMMUNITY)

## 2023-04-20 ENCOUNTER — Encounter (HOSPITAL_COMMUNITY): Payer: Self-pay

## 2023-04-20 ENCOUNTER — Encounter (HOSPITAL_COMMUNITY): Admission: EM | Disposition: E | Payer: Self-pay | Source: Home / Self Care | Attending: Internal Medicine

## 2023-04-20 ENCOUNTER — Encounter (HOSPITAL_COMMUNITY)

## 2023-04-20 DIAGNOSIS — A419 Sepsis, unspecified organism: Secondary | ICD-10-CM

## 2023-04-20 DIAGNOSIS — N179 Acute kidney failure, unspecified: Secondary | ICD-10-CM | POA: Diagnosis not present

## 2023-04-20 DIAGNOSIS — I272 Pulmonary hypertension, unspecified: Secondary | ICD-10-CM | POA: Diagnosis not present

## 2023-04-20 DIAGNOSIS — M79661 Pain in right lower leg: Secondary | ICD-10-CM

## 2023-04-20 DIAGNOSIS — E8729 Other acidosis: Secondary | ICD-10-CM | POA: Diagnosis not present

## 2023-04-20 DIAGNOSIS — E162 Hypoglycemia, unspecified: Secondary | ICD-10-CM

## 2023-04-20 DIAGNOSIS — R6521 Severe sepsis with septic shock: Secondary | ICD-10-CM

## 2023-04-20 DIAGNOSIS — R011 Cardiac murmur, unspecified: Secondary | ICD-10-CM

## 2023-04-20 HISTORY — PX: RIGHT HEART CATH: CATH118263

## 2023-04-20 LAB — COOXEMETRY PANEL
Carboxyhemoglobin: 1.3 % (ref 0.5–1.5)
Methemoglobin: 1 % (ref 0.0–1.5)
O2 Saturation: 67.3 %
Total hemoglobin: 13.5 g/dL (ref 12.0–16.0)

## 2023-04-20 LAB — POCT I-STAT 7, (LYTES, BLD GAS, ICA,H+H)
Acid-base deficit: 10 mmol/L — ABNORMAL HIGH (ref 0.0–2.0)
Acid-base deficit: 9 mmol/L — ABNORMAL HIGH (ref 0.0–2.0)
Acid-base deficit: 9 mmol/L — ABNORMAL HIGH (ref 0.0–2.0)
Acid-base deficit: 10 mmol/L — ABNORMAL HIGH (ref 0.0–2.0)
Acid-base deficit: 8 mmol/L — ABNORMAL HIGH (ref 0.0–2.0)
Bicarbonate: 14.2 mmol/L — ABNORMAL LOW (ref 20.0–28.0)
Bicarbonate: 16.8 mmol/L — ABNORMAL LOW (ref 20.0–28.0)
Bicarbonate: 17.1 mmol/L — ABNORMAL LOW (ref 20.0–28.0)
Bicarbonate: 14.4 mmol/L — ABNORMAL LOW (ref 20.0–28.0)
Bicarbonate: 15.5 mmol/L — ABNORMAL LOW (ref 20.0–28.0)
Calcium, Ion: 1.11 mmol/L — ABNORMAL LOW (ref 1.15–1.40)
Calcium, Ion: 1.05 mmol/L — ABNORMAL LOW (ref 1.15–1.40)
Calcium, Ion: 1.09 mmol/L — ABNORMAL LOW (ref 1.15–1.40)
Calcium, Ion: 1.06 mmol/L — ABNORMAL LOW (ref 1.15–1.40)
Calcium, Ion: 1.08 mmol/L — ABNORMAL LOW (ref 1.15–1.40)
HCT: 42 % (ref 36.0–46.0)
HCT: 42 % (ref 36.0–46.0)
HCT: 42 % (ref 36.0–46.0)
HCT: 40 % (ref 36.0–46.0)
HCT: 41 % (ref 36.0–46.0)
Hemoglobin: 14.3 g/dL (ref 12.0–15.0)
Hemoglobin: 14.3 g/dL (ref 12.0–15.0)
Hemoglobin: 14.3 g/dL (ref 12.0–15.0)
Hemoglobin: 13.6 g/dL (ref 12.0–15.0)
Hemoglobin: 13.9 g/dL (ref 12.0–15.0)
O2 Saturation: 93 %
O2 Saturation: 56 %
O2 Saturation: 58 %
O2 Saturation: 94 %
O2 Saturation: 94 %
Patient temperature: 98.5
Patient temperature: 36.8
Patient temperature: 98.1
Potassium: 5.1 mmol/L (ref 3.5–5.1)
Potassium: 4.9 mmol/L (ref 3.5–5.1)
Potassium: 5 mmol/L (ref 3.5–5.1)
Potassium: 4.9 mmol/L (ref 3.5–5.1)
Potassium: 5.1 mmol/L (ref 3.5–5.1)
Sodium: 134 mmol/L — ABNORMAL LOW (ref 135–145)
Sodium: 136 mmol/L (ref 135–145)
Sodium: 136 mmol/L (ref 135–145)
Sodium: 134 mmol/L — ABNORMAL LOW (ref 135–145)
Sodium: 135 mmol/L (ref 135–145)
TCO2: 15 mmol/L — ABNORMAL LOW (ref 22–32)
TCO2: 18 mmol/L — ABNORMAL LOW (ref 22–32)
TCO2: 18 mmol/L — ABNORMAL LOW (ref 22–32)
TCO2: 15 mmol/L — ABNORMAL LOW (ref 22–32)
TCO2: 16 mmol/L — ABNORMAL LOW (ref 22–32)
pCO2 arterial: 27.4 mmHg — ABNORMAL LOW (ref 32–48)
pCO2 arterial: 37 mmHg (ref 32–48)
pCO2 arterial: 37.3 mmHg (ref 32–48)
pCO2 arterial: 27.2 mmHg — ABNORMAL LOW (ref 32–48)
pCO2 arterial: 28 mmHg — ABNORMAL LOW (ref 32–48)
pH, Arterial: 7.322 — ABNORMAL LOW (ref 7.35–7.45)
pH, Arterial: 7.263 — ABNORMAL LOW (ref 7.35–7.45)
pH, Arterial: 7.274 — ABNORMAL LOW (ref 7.35–7.45)
pH, Arterial: 7.318 — ABNORMAL LOW (ref 7.35–7.45)
pH, Arterial: 7.361 (ref 7.35–7.45)
pO2, Arterial: 72 mmHg — ABNORMAL LOW (ref 83–108)
pO2, Arterial: 33 mmHg — CL (ref 83–108)
pO2, Arterial: 34 mmHg — CL (ref 83–108)
pO2, Arterial: 72 mmHg — ABNORMAL LOW (ref 83–108)
pO2, Arterial: 74 mmHg — ABNORMAL LOW (ref 83–108)

## 2023-04-20 LAB — PHOSPHORUS: Phosphorus: 4.1 mg/dL (ref 2.5–4.6)

## 2023-04-20 LAB — COMPREHENSIVE METABOLIC PANEL
ALT: 75 U/L — ABNORMAL HIGH (ref 0–44)
AST: 238 U/L — ABNORMAL HIGH (ref 15–41)
Albumin: 2.3 g/dL — ABNORMAL LOW (ref 3.5–5.0)
Alkaline Phosphatase: 231 U/L — ABNORMAL HIGH (ref 38–126)
Anion gap: 9 (ref 5–15)
BUN: 36 mg/dL — ABNORMAL HIGH (ref 6–20)
CO2: 16 mmol/L — ABNORMAL LOW (ref 22–32)
Calcium: 8.1 mg/dL — ABNORMAL LOW (ref 8.9–10.3)
Chloride: 105 mmol/L (ref 98–111)
Creatinine, Ser: 2.43 mg/dL — ABNORMAL HIGH (ref 0.44–1.00)
GFR, Estimated: 23 mL/min — ABNORMAL LOW (ref >60)
Glucose, Bld: 216 mg/dL — ABNORMAL HIGH (ref 70–99)
Potassium: 4.8 mmol/L (ref 3.5–5.1)
Sodium: 130 mmol/L — ABNORMAL LOW (ref 135–145)
Total Bilirubin: 2.6 mg/dL — ABNORMAL HIGH (ref 0.0–1.2)
Total Protein: 6.2 g/dL — ABNORMAL LOW (ref 6.5–8.1)

## 2023-04-20 LAB — T4, FREE: Free T4: 1.24 ng/dL — ABNORMAL HIGH (ref 0.61–1.12)

## 2023-04-20 LAB — CBC
HCT: 35.4 % — ABNORMAL LOW (ref 36.0–46.0)
Hemoglobin: 12.1 g/dL (ref 12.0–15.0)
MCH: 32.5 pg (ref 26.0–34.0)
MCHC: 34.2 g/dL (ref 30.0–36.0)
MCV: 95.2 fL (ref 80.0–100.0)
Platelets: 358 10*3/uL (ref 150–400)
RBC: 3.72 MIL/uL — ABNORMAL LOW (ref 3.87–5.11)
RDW: 17.3 % — ABNORMAL HIGH (ref 11.5–15.5)
WBC: 13.4 10*3/uL — ABNORMAL HIGH (ref 4.0–10.5)
nRBC: 0 % (ref 0.0–0.2)

## 2023-04-20 LAB — URINALYSIS, ROUTINE W REFLEX MICROSCOPIC
Bilirubin Urine: NEGATIVE
Glucose, UA: >=500 mg/dL — AB
Ketones, ur: NEGATIVE mg/dL
Leukocytes,Ua: NEGATIVE
Nitrite: NEGATIVE
Protein, ur: 100 mg/dL — AB
Specific Gravity, Urine: 1.007 (ref 1.005–1.030)
pH: 6 (ref 5.0–8.0)

## 2023-04-20 LAB — ECHOCARDIOGRAM COMPLETE
Area-P 1/2: 4.17 cm2
Height: 66 in
S' Lateral: 1.7 cm
Weight: 2938.29 [oz_av]

## 2023-04-20 LAB — PROCALCITONIN: Procalcitonin: 6.33 ng/mL

## 2023-04-20 LAB — LACTIC ACID, PLASMA
Lactic Acid, Venous: 4.9 mmol/L (ref 0.5–1.9)
Lactic Acid, Venous: 3.5 mmol/L (ref 0.5–1.9)

## 2023-04-20 LAB — AMMONIA: Ammonia: 37 umol/L — ABNORMAL HIGH (ref 9–35)

## 2023-04-20 LAB — MRSA NEXT GEN BY PCR, NASAL: MRSA by PCR Next Gen: NOT DETECTED

## 2023-04-20 LAB — CBG MONITORING, ED
Glucose-Capillary: 131 mg/dL — ABNORMAL HIGH (ref 70–99)
Glucose-Capillary: 82 mg/dL (ref 70–99)
Glucose-Capillary: 90 mg/dL (ref 70–99)
Glucose-Capillary: 92 mg/dL (ref 70–99)

## 2023-04-20 LAB — CORTISOL: Cortisol, Plasma: 91.9 ug/dL

## 2023-04-20 LAB — GLUCOSE, CAPILLARY
Glucose-Capillary: 357 mg/dL — ABNORMAL HIGH (ref 70–99)
Glucose-Capillary: 115 mg/dL — ABNORMAL HIGH (ref 70–99)
Glucose-Capillary: 98 mg/dL (ref 70–99)
Glucose-Capillary: 85 mg/dL (ref 70–99)

## 2023-04-20 LAB — MAGNESIUM: Magnesium: 1.6 mg/dL — ABNORMAL LOW (ref 1.7–2.4)

## 2023-04-20 SURGERY — RIGHT HEART CATH
Anesthesia: LOCAL

## 2023-04-20 MED ORDER — ACETAMINOPHEN 325 MG PO TABS: 650.0000 mg | ORAL_TABLET | Freq: Four times a day (QID) | ORAL | Status: DC | PRN

## 2023-04-20 MED ORDER — GLYCOPYRROLATE 0.2 MG/ML IJ SOLN: 0.2000 mg | INTRAMUSCULAR | Status: DC | PRN

## 2023-04-20 MED ORDER — INSULIN ASPART 100 UNIT/ML IJ SOLN: 0.0000 [IU] | INTRAMUSCULAR | Status: DC

## 2023-04-20 MED ORDER — NOREPINEPHRINE 4 MG/250ML-% IV SOLN
0.0000 ug/min | INTRAVENOUS | Status: DC
  Administered 2023-04-20: 2 ug/min via INTRAVENOUS
  Filled 2023-04-20 (×2): qty 250

## 2023-04-20 MED ORDER — SODIUM CHLORIDE 0.9 % IV SOLN: INTRAVENOUS | Status: DC

## 2023-04-20 MED ORDER — TRAMADOL HCL 50 MG PO TABS: 50.0000 mg | ORAL_TABLET | Freq: Four times a day (QID) | ORAL | Status: DC | PRN

## 2023-04-20 MED ORDER — PIPERACILLIN-TAZOBACTAM 3.375 G IVPB
3.3750 g | Freq: Three times a day (TID) | INTRAVENOUS | Status: DC
  Administered 2023-04-20: 3.375 g via INTRAVENOUS
  Filled 2023-04-20 (×2): qty 50

## 2023-04-20 MED ORDER — HEPARIN SODIUM (PORCINE) 5000 UNIT/ML IJ SOLN
5000.0000 [IU] | Freq: Three times a day (TID) | INTRAMUSCULAR | Status: DC
  Administered 2023-04-20: 5000 [IU] via SUBCUTANEOUS
  Filled 2023-04-20: qty 1

## 2023-04-20 MED ORDER — ALBUMIN HUMAN 25 % IV SOLN
12.5000 g | Freq: Once | INTRAVENOUS | Status: AC
  Administered 2023-04-20: 12.5 g via INTRAVENOUS
  Filled 2023-04-20: qty 50

## 2023-04-20 MED ORDER — MAGNESIUM SULFATE 2 GM/50ML IV SOLN
2.0000 g | Freq: Once | INTRAVENOUS | Status: AC
  Administered 2023-04-20: 2 g via INTRAVENOUS
  Filled 2023-04-20: qty 50

## 2023-04-20 MED ORDER — DEXMEDETOMIDINE HCL IN NACL 400 MCG/100ML IV SOLN
INTRAVENOUS | Status: AC
Start: 2023-04-20 — End: 2023-04-20
  Administered 2023-04-20: 0.2 ug/kg/h via INTRAVENOUS
  Filled 2023-04-20: qty 100

## 2023-04-20 MED ORDER — FUROSEMIDE 10 MG/ML IJ SOLN
80.0000 mg | Freq: Once | INTRAMUSCULAR | Status: AC
  Administered 2023-04-20: 80 mg via INTRAVENOUS
  Filled 2023-04-20: qty 8

## 2023-04-20 MED ORDER — HYDROMORPHONE HCL 1 MG/ML IJ SOLN: 0.5000 mg | INTRAMUSCULAR | Status: DC | PRN

## 2023-04-20 MED ORDER — STERILE WATER FOR INJECTION IJ SOLN
50.0000 ng/kg/min | INTRAVENOUS | Status: DC
  Administered 2023-04-20: 50 ng/kg/min via RESPIRATORY_TRACT
  Filled 2023-04-20 (×2): qty 5

## 2023-04-20 MED ORDER — HYDROMORPHONE HCL 1 MG/ML IJ SOLN
1.0000 mg | INTRAMUSCULAR | Status: DC | PRN
  Administered 2023-04-20: 1 mg via INTRAVENOUS
  Filled 2023-04-20: qty 1

## 2023-04-20 MED ORDER — GLYCOPYRROLATE 1 MG PO TABS: 1.0000 mg | ORAL_TABLET | ORAL | Status: DC | PRN

## 2023-04-20 MED ORDER — VASOPRESSIN 20 UNITS/100 ML INFUSION FOR SHOCK
0.0000 [IU]/min | INTRAVENOUS | Status: DC
  Administered 2023-04-20: 0.03 [IU]/min via INTRAVENOUS
  Filled 2023-04-20 (×2): qty 100

## 2023-04-20 MED ORDER — MEDIHONEY WOUND/BURN DRESSING EX PSTE
1.0000 | PASTE | Freq: Every day | CUTANEOUS | Status: DC
  Administered 2023-04-20: 1 via TOPICAL
  Filled 2023-04-20: qty 44

## 2023-04-20 MED ORDER — CHLORHEXIDINE GLUCONATE CLOTH 2 % EX PADS
6.0000 | MEDICATED_PAD | Freq: Every day | CUTANEOUS | Status: DC
  Administered 2023-04-20: 6 via TOPICAL

## 2023-04-20 MED ORDER — MILRINONE LACTATE IN DEXTROSE 20-5 MG/100ML-% IV SOLN
0.5000 ug/kg/min | INTRAVENOUS | Status: DC
  Administered 2023-04-20: 0.125 ug/kg/min via INTRAVENOUS
  Filled 2023-04-20: qty 100

## 2023-04-20 MED ORDER — MORPHINE 100MG IN NS 100ML (1MG/ML) PREMIX INFUSION
0.0000 mg/h | INTRAVENOUS | Status: DC
  Administered 2023-04-20: 5 mg/h via INTRAVENOUS
  Filled 2023-04-20: qty 100

## 2023-04-20 MED ORDER — LIDOCAINE HCL (PF) 1 % IJ SOLN
INTRAMUSCULAR | Status: AC
  Filled 2023-04-20: qty 30

## 2023-04-20 MED ORDER — SODIUM BICARBONATE 8.4 % IV SOLN
50.0000 meq | Freq: Once | INTRAVENOUS | Status: AC
  Administered 2023-04-20: 50 meq via INTRAVENOUS
  Filled 2023-04-20: qty 50

## 2023-04-20 MED ORDER — LINEZOLID 600 MG/300ML IV SOLN
600.0000 mg | Freq: Two times a day (BID) | INTRAVENOUS | Status: DC
  Administered 2023-04-20: 600 mg via INTRAVENOUS
  Filled 2023-04-20 (×2): qty 300

## 2023-04-20 MED ORDER — POLYETHYLENE GLYCOL 3350 17 G PO PACK: 17.0000 g | PACK | Freq: Every day | ORAL | Status: DC | PRN

## 2023-04-20 MED ORDER — DEXMEDETOMIDINE HCL IN NACL 400 MCG/100ML IV SOLN
0.0000 ug/kg/h | INTRAVENOUS | Status: DC
Start: 2023-04-20 — End: 2023-04-20

## 2023-04-20 MED ORDER — ACETAMINOPHEN 650 MG RE SUPP: 650.0000 mg | Freq: Four times a day (QID) | RECTAL | Status: DC | PRN

## 2023-04-20 MED ORDER — ZINC OXIDE 40 % EX OINT
TOPICAL_OINTMENT | Freq: Two times a day (BID) | CUTANEOUS | Status: DC
  Filled 2023-04-20: qty 57

## 2023-04-20 MED ORDER — LIDOCAINE HCL (PF) 1 % IJ SOLN
INTRAMUSCULAR | Status: DC | PRN
  Administered 2023-04-20: 5 mL

## 2023-04-20 MED ORDER — METRONIDAZOLE 500 MG/100ML IV SOLN
500.0000 mg | Freq: Two times a day (BID) | INTRAVENOUS | Status: DC
  Administered 2023-04-20: 500 mg via INTRAVENOUS
  Filled 2023-04-20: qty 100

## 2023-04-20 MED ORDER — HEPARIN BOLUS VIA INFUSION
4000.0000 [IU] | Freq: Once | INTRAVENOUS | Status: DC
  Filled 2023-04-20: qty 4000

## 2023-04-20 MED ORDER — VANCOMYCIN VARIABLE DOSE PER UNSTABLE RENAL FUNCTION (PHARMACIST DOSING): Status: DC

## 2023-04-20 MED ORDER — DOCUSATE SODIUM 100 MG PO CAPS: 100.0000 mg | ORAL_CAPSULE | Freq: Two times a day (BID) | ORAL | Status: DC | PRN

## 2023-04-20 MED ORDER — MORPHINE BOLUS VIA INFUSION
5.0000 mg | INTRAVENOUS | Status: DC | PRN
  Administered 2023-04-20: 5 mg via INTRAVENOUS

## 2023-04-20 MED ORDER — NOREPINEPHRINE 16 MG/250ML-% IV SOLN
0.0000 ug/min | INTRAVENOUS | Status: DC
  Administered 2023-04-20: 16 ug/min via INTRAVENOUS
  Filled 2023-04-20 (×2): qty 250

## 2023-04-20 MED ORDER — POLYVINYL ALCOHOL 1.4 % OP SOLN: 1.0000 [drp] | Freq: Four times a day (QID) | OPHTHALMIC | Status: DC | PRN

## 2023-04-20 MED ORDER — HALOPERIDOL LACTATE 5 MG/ML IJ SOLN
5.0000 mg | Freq: Four times a day (QID) | INTRAMUSCULAR | Status: DC | PRN
Start: 2023-04-20 — End: 2023-04-21
  Administered 2023-04-20: 5 mg via INTRAVENOUS
  Filled 2023-04-20: qty 1

## 2023-04-20 MED ORDER — HEPARIN (PORCINE) 25000 UT/250ML-% IV SOLN
1250.0000 [IU]/h | INTRAVENOUS | Status: DC
  Administered 2023-04-20: 1250 [IU]/h via INTRAVENOUS
  Filled 2023-04-20: qty 250

## 2023-04-20 SURGICAL SUPPLY — 5 items
CATH SWAN GANZ VIP 7.5F (CATHETERS) IMPLANT
PACK CARDIAC CATHETERIZATION (CUSTOM PROCEDURE TRAY) ×1 IMPLANT
SHEATH PINNACLE 8F 10CM (SHEATH) IMPLANT
WIRE EMERALD 3MM-J .025X260CM (WIRE) IMPLANT
WIRE EMERALD 3MM-J .035X150CM (WIRE) IMPLANT

## 2023-04-21 ENCOUNTER — Ambulatory Visit: Admitting: Family Medicine

## 2023-04-21 ENCOUNTER — Encounter (HOSPITAL_COMMUNITY): Payer: Self-pay | Admitting: Cardiology

## 2023-04-21 LAB — ENA+DNA/DS+ANTICH+CENTRO+JO...
Anti JO-1: <0.2 AI (ref 0.0–0.9)
Centromere Ab Screen: <0.2 AI (ref 0.0–0.9)
Chromatin Ab SerPl-aCnc: 1.8 AI — ABNORMAL HIGH (ref 0.0–0.9)
ENA SM Ab Ser-aCnc: <0.2 AI (ref 0.0–0.9)
Ribonucleic Protein: <0.2 AI (ref 0.0–0.9)
SSA (Ro) (ENA) Antibody, IgG: <0.2 AI (ref 0.0–0.9)
SSB (La) (ENA) Antibody, IgG: <0.2 AI (ref 0.0–0.9)
Scleroderma (Scl-70) (ENA) Antibody, IgG: <0.2 AI (ref 0.0–0.9)
ds DNA Ab: 36 [IU]/mL — ABNORMAL HIGH (ref 0–9)

## 2023-04-21 LAB — CENTROMERE ANTIBODIES: Centromere Ab Screen: <0.2 AI (ref 0.0–0.9)

## 2023-04-21 LAB — ANA W/REFLEX IF POSITIVE: Anti Nuclear Antibody (ANA): POSITIVE — AB

## 2023-04-21 LAB — ANTI-SCLERODERMA ANTIBODY: Scleroderma (Scl-70) (ENA) Antibody, IgG: <0.2 AI (ref 0.0–0.9)

## 2023-04-22 LAB — RHEUMATOID FACTOR: Rheumatoid fact SerPl-aCnc: 32.2 [IU]/mL — ABNORMAL HIGH (ref <14.0)

## 2023-04-24 LAB — CULTURE, BLOOD (ROUTINE X 2)
Culture: NO GROWTH
Culture: NO GROWTH

## 2023-04-25 LAB — GLUCOSE, CAPILLARY: Glucose-Capillary: 172 mg/dL — ABNORMAL HIGH (ref 70–99)

## 2023-05-10 NOTE — Consult Note (Addendum)
 Advanced Heart Failure Team Consult Note  Primary Physician: Glendale Chard, DO Cardiologist:  None  Reason for Consultation: RV Failure  HPI:    Sarah Macdonald is seen today for evaluation of RV Failure at the request of Dr. Katrinka Blazing.   Sarah Macdonald is a 58 yo with PMH of DVT, HLD, HTN, T2DM, chronic LE edema, PVD, and nonhealing wounds.   No cardiac history. Has been scheduled numerous times over the last 6 years for echocardiogram by PCP, however has no showed to all. Has followed closely with PCP for treatment of wound, cellutlitis, and T2DM. Was scheduled to see [facility] on 04/15/23, however cancelled appointment.   Presented to [facility] by EMS for profound weakness and b/l nonhealing wounds. Vitals notable for hypotension with SBP in 90s and tachycardia in the 100s and hypoglycemia. Initial labs notable for blood glucose <10, K 5.1, Na 130, BUN/Cr 37/2.21, CO2 37, BNP 1037, LA 2.3, WBC 12.5. EKG with ST 104 bpm with low voltage. CXR with cardiomegaly and pulm congestion. Sepsis work up initiated and she was started on antibiotics. In ED, blood glucose remained intermittently low with rising lactic acid, up to 4.9. She was given 1L IVF and place on D5LR gtt. Continued to develop worsening hypotension, despite volume resuscitation and CCM consulted. She was transferred to ICU for further management. Echo performed (results below). Orthopedics and Vascular surgery consulted.   Echo today: EF 60-65%, n RWMA, G1DD, D shaped septum with mod reduced RV and RVSP 903 mmHg. Mod TR. Dilated IVC. Sm pericardial effusion.  Somnolent on exam today. Co-ox 67%. CVP 14-15. On NE 16. Extensive wounds to BLE, wraps in place. Cr up with IVF 1.08>2.2>2.43. WBC 13.4. LA 3.5 this am.   Home Medications Prior to Admission medications   Medication Sig Start Date End Date Taking? Authorizing Provider  acetaminophen (TYLENOL) 500 MG tablet Take 2 tablets (1,000 mg total) by mouth every 8 (eight) hours as  needed. 09/21/19  Yes Brimage, Vondra, DO  doxycycline (VIBRA-TABS) 100 MG tablet Take 1 tablet (100 mg total) by mouth 2 (two) times daily for 10 days. 04/12/23 04/22/23 Yes Glendale Chard, DO  DULoxetine (CYMBALTA) 60 MG capsule Take 1 capsule (60 mg total) by mouth daily. 04/12/23  Yes Glendale Chard, DO  empagliflozin (JARDIANCE) 25 MG TABS tablet Take 1 tablet (25 mg total) by mouth daily. 04/12/23  Yes Glendale Chard, DO  furosemide (LASIX) 40 MG tablet TAKE 1 TABLET(40 MG) BY MOUTH EVERY MORNING 11/19/22  Yes Lockie Mola, MD  losartan (COZAAR) 100 MG tablet Take 1 tablet (100 mg total) by mouth daily. 04/12/23  Yes Glendale Chard, DO  metFORMIN (GLUCOPHAGE-XR) 500 MG 24 hr tablet Take 2 tablets (1,000 mg total) by mouth 2 (two) times daily with a meal. 04/12/23  Yes Glendale Chard, DO  Multiple Vitamin (MULTIVITAMIN) capsule Take 1 capsule by mouth daily. 01/02/19  Yes Mirian Mo, MD  rosuvastatin (CRESTOR) 40 MG tablet Take 1 tablet (40 mg total) by mouth daily. 04/12/23  Yes Glendale Chard, DO  triamcinolone (KENALOG) 0.025 % cream Apply 1 Application topically daily as needed. 01/04/23  Yes Lincoln Brigham, MD  Blood Pressure Monitoring (BLOOD PRESSURE MONITOR AUTOMAT) DEVI 1 kit by Does not apply route daily. Please call the office if you have any issues obtaining this monitor. 06/18/19   Melene Plan, MD   Past Medical History: Past Medical History:  Diagnosis Date   Diabetes mellitus without complication (HCC)    DVT (deep  venous thrombosis) (HCC)    Hyperlipidemia    Hypertension    Past Surgical History: History reviewed. No pertinent surgical history.  Family History: Family History  Problem Relation Age of Onset   Hypertension Mother    Hypertension Father    Hyperlipidemia Sister    Social History: Social History   Socioeconomic History   Marital status: Single    Spouse name: Not on file   Number of children: Not on file   Years of education: Not on file   Highest education  level: Not on file  Occupational History   Not on file  Tobacco Use   Smoking status: Former    Current packs/day: 0.00    Types: Cigarettes    Quit date: 01/25/2012    Years since quitting: 11.2   Smokeless tobacco: Never  Vaping Use   Vaping status: Never Used  Substance and Sexual Activity   Alcohol use: No   Drug use: No   Sexual activity: Not Currently  Other Topics Concern   Not on file  Social History Narrative   Not on file   Social Drivers of Health   Financial Resource Strain: Not on file  Food Insecurity: Not on file  Transportation Needs: Not on file  Physical Activity: Not on file  Stress: Not on file  Social Connections: Not on file   Allergies:  Allergies  Allergen Reactions   Cortisone Swelling   Objective:    Vital Signs:   Temp:  [96.7 F (35.9 C)-98.6 F (37 C)] 97.3 F (36.3 C) (03/12 0700) Pulse Rate:  [79-126] 106 (03/12 1100) Resp:  [6-35] 20 (03/12 1100) BP: (68-128)/(42-99) 107/80 (03/12 1100) SpO2:  [62 %-100 %] 89 % (03/12 1100) Weight:  [83.3 kg-86.2 kg] 83.3 kg (03/12 0520) Last BM Date :  (pta) Weight change: Filed Weights   04/19/23 1544 04/22/2023 0520  Weight: 86.2 kg 83.3 kg   Intake/Output:  Intake/Output Summary (Last 24 hours) at 05/01/2023 1123 Last data filed at 05/01/2023 1000 Gross per 24 hour  Intake 2141.02 ml  Output 450 ml  Net 1691.02 ml    Physical Exam    CVP 14 General: Appears much older than age. Uninteractive Cardiac: JVP ~10cm. S1 and S2 present. No murmurs or rub. Abdomen: Soft, non-tender, non-distended. Extremities: Warm and dry. BLE numerous wounds. 2+ BLE edema Neuro: Somnolent Lines/Devices:  RIJ CVL  Telemetry   SR in 100s (personally reviewed)  EKG    As above (personally reviewed)  Labs   Basic Metabolic Panel: Recent Labs  Lab 04/19/23 1618 04/14/2023 0458 04/21/2023 0616  NA 130* 130* 134*  K 5.1 4.8 5.1  CL 100 105  --   CO2 17* 16*  --   GLUCOSE 70 216*  --   BUN 37*  36*  --   CREATININE 2.21* 2.43*  --   CALCIUM 8.7* 8.1*  --   MG 1.7 1.6*  --   PHOS  --  4.1  --    Liver Function Tests: Recent Labs  Lab 04/19/23 1618 04/26/2023 0458  AST 99* 238*  ALT 45* 75*  ALKPHOS 247* 231*  BILITOT 2.6* 2.6*  PROT 7.0 6.2*  ALBUMIN 2.6* 2.3*   CBC: Recent Labs  Lab 04/19/23 1618 04/25/2023 0429 04/12/2023 0616  WBC 12.5* 13.4*  --   NEUTROABS 11.4*  --   --   HGB 15.2* 12.1 14.3  HCT 45.4 35.4* 42.0  MCV 95.0 95.2  --   PLT 392  358  --    BNP: BNP (last 3 results) Recent Labs    03/18/23 1559 03/31/23 1705 04/19/23 1603  BNP 929.3* 1,069.2* 1,037.9*   CBG: Recent Labs  Lab 04/11/2023 0104 04/26/2023 0159 04/22/2023 0314 04/23/2023 0558 05/04/2023 0748  GLUCAP 131* 82 92 357* 115*   Coagulation Studies: No results for input(s): "LABPROT", "INR" in the last 72 hours.  Imaging   DG CHEST PORT 1 VIEW Result Date: 04/27/2023 CLINICAL DATA:  [id number] Migration of central venous catheter (CVC) (HCC) [id number] EXAM: PORTABLE CHEST 1 VIEW COMPARISON:  04/19/2023 FINDINGS: Interval placement of right internal jugular approach central venous catheter with distal tip terminating at the superior cavoatrial junction. Stable cardiomegaly. Aortic atherosclerosis. Mild pulmonary vascular congestion. No new airspace consolidation. No large pleural fluid collection. No pneumothorax. IMPRESSION: Interval placement of right internal jugular approach central venous catheter with distal tip terminating at the superior cavoatrial junction. No pneumothorax. Electronically Signed   By: Duanne Guess D.O.   On: 04/17/2023 11:01   ECHOCARDIOGRAM COMPLETE Result Date: 05/03/2023    ECHOCARDIOGRAM REPORT   Patient Name:   COURTNI BALASH Date of Exam: 04/29/2023 Medical Rec #:  [id number]           Height:       66.0 in Accession #:    [id number]          Weight:       183.6 lb Date of Birth:  1965/10/31           BSA:          1.929 m Patient Age:    57 years            BP:            97/63 mmHg Patient Gender: F                   HR:           106 bpm. Exam Location:  Inpatient Procedure: 2D Echo, Color Doppler and Cardiac Doppler (Both Spectral and Color            Flow Doppler were utilized during procedure). Indications:    Murmur R01.1  History:        Patient has no prior history of Echocardiogram examinations.                 Risk Factors:Dyslipidemia and Hypertension.  Sonographer:    Harriette Bouillon RDCS Referring Phys: [id number] Darl Householder RUMBALL IMPRESSIONS  1. Left ventricular ejection fraction, by estimation, is 60 to 65%. The left ventricle has moderately decreased function. The left ventricle has no regional wall motion abnormalities. Left ventricular diastolic parameters are consistent with Grade I diastolic dysfunction (impaired relaxation).  2. D-shaped septum suggestive of RV pressure/volume overload. Right ventricular systolic function is moderately reduced. The right ventricular size is severely enlarged. There is severely elevated pulmonary artery systolic pressure. The estimated right ventricular systolic pressure is 90.3 mmHg.  3. Right atrial size was severely dilated.  4. A small pericardial effusion is present. The pericardial effusion is circumferential.  5. The mitral valve is normal in structure. Trivial mitral valve regurgitation. No evidence of mitral stenosis.  6. The tricuspid valve is abnormal. Tricuspid valve regurgitation is moderate.  7. The aortic valve is tricuspid. Aortic valve regurgitation is not visualized. No aortic stenosis is present.  8. The inferior vena cava is dilated in size with <50% respiratory variability, suggesting right atrial pressure of 15  mmHg. FINDINGS  Left Ventricle: Left ventricular ejection fraction, by estimation, is 60 to 65%. The left ventricle has moderately decreased function. The left ventricle has no regional wall motion abnormalities. The left ventricular internal cavity size was normal in size. There is no left  ventricular hypertrophy. Left ventricular diastolic parameters are consistent with Grade I diastolic dysfunction (impaired relaxation). Right Ventricle: D-shaped septum suggestive of RV pressure/volume overload. The right ventricular size is severely enlarged. No increase in right ventricular wall thickness. Right ventricular systolic function is moderately reduced. There is severely elevated pulmonary artery systolic pressure. The tricuspid regurgitant velocity is 4.34 m/s, and with an assumed right atrial pressure of 15 mmHg, the estimated right ventricular systolic pressure is 90.3 mmHg. Left Atrium: Left atrial size was normal in size. Right Atrium: Right atrial size was severely dilated. Pericardium: A small pericardial effusion is present. The pericardial effusion is circumferential. Mitral Valve: The mitral valve is normal in structure. Trivial mitral valve regurgitation. No evidence of mitral valve stenosis. Tricuspid Valve: The tricuspid valve is abnormal. Tricuspid valve regurgitation is moderate. Aortic Valve: The aortic valve is tricuspid. Aortic valve regurgitation is not visualized. No aortic stenosis is present. Pulmonic Valve: The pulmonic valve was normal in structure. Pulmonic valve regurgitation is trivial. Aorta: The aortic root is normal in size and structure. Venous: The inferior vena cava is dilated in size with less than 50% respiratory variability, suggesting right atrial pressure of 15 mmHg. IAS/Shunts: No atrial level shunt detected by color flow Doppler.  LEFT VENTRICLE PLAX 2D LVIDd:         3.60 cm   Diastology LVIDs:         1.70 cm   LV e' medial:    5.11 cm/s LV PW:         1.10 cm   LV E/e' medial:  10.1 LV IVS:        1.00 cm   LV e' lateral:   10.40 cm/s LVOT diam:     1.80 cm   LV E/e' lateral: 4.9 LV SV:         25 LV SV Index:   13 LVOT Area:     2.54 cm  RIGHT VENTRICLE             IVC RV S prime:     11.60 cm/s  IVC diam: 2.80 cm TAPSE (M-mode): 1.2 cm LEFT ATRIUM            Index        RIGHT ATRIUM           Index LA diam:      3.10 cm 1.61 cm/m   RA Area:     44.60 cm LA Vol (A4C): 34.4 ml 17.84 ml/m  RA Volume:   201.00 ml 104.22 ml/m  AORTIC VALVE LVOT Vmax:   73.80 cm/s LVOT Vmean:  51.200 cm/s LVOT VTI:    0.100 m  AORTA Ao Root diam: 3.20 cm Ao Asc diam:  3.40 cm MITRAL VALVE               TRICUSPID VALVE MV Area (PHT): 4.17 cm    TR Peak grad:   75.3 mmHg MV Decel Time: 182 msec    TR Vmax:        434.00 cm/s MV E velocity: 51.40 cm/s MV A velocity: 60.00 cm/s  SHUNTS MV E/A ratio:  0.86        Systemic VTI:  0.10 m  Systemic Diam: 1.80 cm Kylia Grajales McleanMD Electronically signed by Wilfred Lacy Signature Date/Time: 04/28/2023/9:16:52 AM    Final    CT FEMUR LEFT WO CONTRAST Result Date: 04/11/2023 CLINICAL DATA:  Necrotizing fasciitis suspected, lower leg, xray done EXAM: CT OF THE FEMURS BILATERALLY WITHOUT CONTRAST. CT OF THE LOWER EXTREMITIES BILATERALLY WITHOUT CONTRAST CT OF THE FEET BILATERALLY WITHOUT CONTRAST TECHNIQUE: Multidetector CT imaging of the lower left extremity was performed according to the standard protocol. RADIATION DOSE REDUCTION: This exam was performed according to the departmental dose-optimization program which includes automated exposure control, adjustment of the mA and/or kV according to patient size and/or use of iterative reconstruction technique. COMPARISON:  None Available. FINDINGS: Bones/Joint/Cartilage Mild motion artifact limits evaluation of the distal right femur. Normal alignment of the hips bilaterally. No acute fracture or dislocation. Mild bilateral degenerative hip arthritis. No lytic or blastic bone lesion identified. No osseous erosions are identified. Normal alignment of the knees bilaterally. Mild to moderate bicompartmental degenerative arthritis involving the lateral medial compartments, most severe within the medial compartment bilateral no acute fracture or dislocation. Mixed lytic and  sclerotic lesion demonstrating arc like calcifications within the proximal left femur most in keeping with an enchondroma. No osseous erosions. Normal alignment of the bones of the feet bilaterally. No acute fracture or dislocation. Joint spaces are preserved. No osseous erosions identified. Small bilateral superior and plantar calcaneal spurs. Ligaments Suboptimally assessed by CT. Muscles and Tendons Extensive intrafascial edema within the thighs bilaterally. The musculotendinous structures of the lower extremities bilaterally are otherwise unremarkable. Soft tissues Extensive subcutaneous edema within the lower extremities bilaterally more severe centrally, particularly within the hips and thighs. Moderate pedal edema bilaterally. No loculated subcutaneous fluid collections are identified. No subcutaneous gas identified. No retained radiopaque foreign body identified. IMPRESSION: 1. Extensive subcutaneous edema within the lower extremities bilaterally more severe centrally, particularly within the hips and thighs. Moderate pedal edema bilaterally. No loculated subcutaneous fluid collections are identified. No subcutaneous gas identified. No retained radiopaque foreign body identified. 2. No acute fracture or dislocation. No osseous erosions identified. 3. 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 4. Small bilateral superior and plantar calcaneal spurs. Electronically Signed   By: Helyn Numbers M.D.   On: 04/27/2023 03:20   CT FEMUR RIGHT WO CONTRAST Result Date: 04/14/2023 CLINICAL DATA:  Necrotizing fasciitis suspected, lower leg, xray done EXAM: CT OF THE FEMURS BILATERALLY WITHOUT CONTRAST. CT OF THE LOWER EXTREMITIES BILATERALLY WITHOUT CONTRAST CT OF THE FEET BILATERALLY WITHOUT CONTRAST TECHNIQUE: Multidetector CT imaging of the lower left extremity was performed  according to the standard protocol. RADIATION DOSE REDUCTION: This exam was performed according to the departmental dose-optimization program which includes automated exposure control, adjustment of the mA and/or kV according to patient size and/or use of iterative reconstruction technique. COMPARISON:  None Available. FINDINGS: Bones/Joint/Cartilage Mild motion artifact limits evaluation of the distal right femur. Normal alignment of the hips bilaterally. No acute fracture or dislocation. Mild bilateral degenerative hip arthritis. No lytic or blastic bone lesion identified. No osseous erosions are identified. Normal alignment of the knees bilaterally. Mild to moderate bicompartmental degenerative arthritis involving the lateral medial compartments, most severe within the medial compartment bilateral no acute fracture or dislocation. Mixed lytic and sclerotic lesion demonstrating arc like calcifications within the proximal left femur most in keeping with an enchondroma. No osseous erosions. Normal alignment of the bones of the feet bilaterally. No  acute fracture or dislocation. Joint spaces are preserved. No osseous erosions identified. Small bilateral superior and plantar calcaneal spurs. Ligaments Suboptimally assessed by CT. Muscles and Tendons Extensive intrafascial edema within the thighs bilaterally. The musculotendinous structures of the lower extremities bilaterally are otherwise unremarkable. Soft tissues Extensive subcutaneous edema within the lower extremities bilaterally more severe centrally, particularly within the hips and thighs. Moderate pedal edema bilaterally. No loculated subcutaneous fluid collections are identified. No subcutaneous gas identified. No retained radiopaque foreign body identified. IMPRESSION: 1. Extensive subcutaneous edema within the lower extremities bilaterally more severe centrally, particularly within the hips and thighs. Moderate pedal edema bilaterally. No loculated  subcutaneous fluid collections are identified. No subcutaneous gas identified. No retained radiopaque foreign body identified. 2. No acute fracture or dislocation. No osseous erosions identified. 3. 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 4. Small bilateral superior and plantar calcaneal spurs. Electronically Signed   By: Helyn Numbers M.D.   On: 04/23/2023 03:20   CT TIBIA FIBULA LEFT WO CONTRAST Result Date: 05/04/2023 CLINICAL DATA:  Necrotizing fasciitis suspected, lower leg, xray done EXAM: CT OF THE FEMURS BILATERALLY WITHOUT CONTRAST. CT OF THE LOWER EXTREMITIES BILATERALLY WITHOUT CONTRAST CT OF THE FEET BILATERALLY WITHOUT CONTRAST TECHNIQUE: Multidetector CT imaging of the lower left extremity was performed according to the standard protocol. RADIATION DOSE REDUCTION: This exam was performed according to the departmental dose-optimization program which includes automated exposure control, adjustment of the mA and/or kV according to patient size and/or use of iterative reconstruction technique. COMPARISON:  None Available. FINDINGS: Bones/Joint/Cartilage Mild motion artifact limits evaluation of the distal right femur. Normal alignment of the hips bilaterally. No acute fracture or dislocation. Mild bilateral degenerative hip arthritis. No lytic or blastic bone lesion identified. No osseous erosions are identified. Normal alignment of the knees bilaterally. Mild to moderate bicompartmental degenerative arthritis involving the lateral medial compartments, most severe within the medial compartment bilateral no acute fracture or dislocation. Mixed lytic and sclerotic lesion demonstrating arc like calcifications within the proximal left femur most in keeping with an enchondroma. No osseous erosions. Normal alignment of the bones of the feet bilaterally. No acute fracture  or dislocation. Joint spaces are preserved. No osseous erosions identified. Small bilateral superior and plantar calcaneal spurs. Ligaments Suboptimally assessed by CT. Muscles and Tendons Extensive intrafascial edema within the thighs bilaterally. The musculotendinous structures of the lower extremities bilaterally are otherwise unremarkable. Soft tissues Extensive subcutaneous edema within the lower extremities bilaterally more severe centrally, particularly within the hips and thighs. Moderate pedal edema bilaterally. No loculated subcutaneous fluid collections are identified. No subcutaneous gas identified. No retained radiopaque foreign body identified. IMPRESSION: 1. Extensive subcutaneous edema within the lower extremities bilaterally more severe centrally, particularly within the hips and thighs. Moderate pedal edema bilaterally. No loculated subcutaneous fluid collections are identified. No subcutaneous gas identified. No retained radiopaque foreign body identified. 2. No acute fracture or dislocation. No osseous erosions identified. 3. 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 4. Small bilateral superior and plantar calcaneal spurs. Electronically Signed   By: Helyn Numbers M.D.   On: 05/07/2023 03:20   CT TIBIA FIBULA RIGHT WO CONTRAST Result Date: 04/17/2023 CLINICAL DATA:  Necrotizing fasciitis suspected, lower leg, xray done EXAM: CT OF THE FEMURS BILATERALLY WITHOUT CONTRAST. CT OF THE LOWER EXTREMITIES BILATERALLY WITHOUT CONTRAST CT  OF THE FEET BILATERALLY WITHOUT CONTRAST TECHNIQUE: Multidetector CT imaging of the lower left extremity was performed according to the standard protocol. RADIATION DOSE REDUCTION: This exam was performed according to the departmental dose-optimization program which includes automated exposure control, adjustment of the mA and/or kV  according to patient size and/or use of iterative reconstruction technique. COMPARISON:  None Available. FINDINGS: Bones/Joint/Cartilage Mild motion artifact limits evaluation of the distal right femur. Normal alignment of the hips bilaterally. No acute fracture or dislocation. Mild bilateral degenerative hip arthritis. No lytic or blastic bone lesion identified. No osseous erosions are identified. Normal alignment of the knees bilaterally. Mild to moderate bicompartmental degenerative arthritis involving the lateral medial compartments, most severe within the medial compartment bilateral no acute fracture or dislocation. Mixed lytic and sclerotic lesion demonstrating arc like calcifications within the proximal left femur most in keeping with an enchondroma. No osseous erosions. Normal alignment of the bones of the feet bilaterally. No acute fracture or dislocation. Joint spaces are preserved. No osseous erosions identified. Small bilateral superior and plantar calcaneal spurs. Ligaments Suboptimally assessed by CT. Muscles and Tendons Extensive intrafascial edema within the thighs bilaterally. The musculotendinous structures of the lower extremities bilaterally are otherwise unremarkable. Soft tissues Extensive subcutaneous edema within the lower extremities bilaterally more severe centrally, particularly within the hips and thighs. Moderate pedal edema bilaterally. No loculated subcutaneous fluid collections are identified. No subcutaneous gas identified. No retained radiopaque foreign body identified. IMPRESSION: 1. Extensive subcutaneous edema within the lower extremities bilaterally more severe centrally, particularly within the hips and thighs. Moderate pedal edema bilaterally. No loculated subcutaneous fluid collections are identified. No subcutaneous gas identified. No retained radiopaque foreign body identified. 2. No acute fracture or dislocation. No osseous erosions identified. 3. Mild bilateral  degenerative hip arthritis. 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 4. Small bilateral superior and plantar calcaneal spurs. Electronically Signed   By: Helyn Numbers M.D.   On: 04/18/2023 03:20   CT FOOT LEFT WO CONTRAST Result Date: 04/23/2023 CLINICAL DATA:  Necrotizing fasciitis suspected, lower leg, xray done EXAM: CT OF THE FEMURS BILATERALLY WITHOUT CONTRAST. CT OF THE LOWER EXTREMITIES BILATERALLY WITHOUT CONTRAST CT OF THE FEET BILATERALLY WITHOUT CONTRAST TECHNIQUE: Multidetector CT imaging of the lower left extremity was performed according to the standard protocol. RADIATION DOSE REDUCTION: This exam was performed according to the departmental dose-optimization program which includes automated exposure control, adjustment of the mA and/or kV according to patient size and/or use of iterative reconstruction technique. COMPARISON:  None Available. FINDINGS: Bones/Joint/Cartilage Mild motion artifact limits evaluation of the distal right femur. Normal alignment of the hips bilaterally. No acute fracture or dislocation. Mild bilateral degenerative hip arthritis. No lytic or blastic bone lesion identified. No osseous erosions are identified. Normal alignment of the knees bilaterally. Mild to moderate bicompartmental degenerative arthritis involving the lateral medial compartments, most severe within the medial compartment bilateral no acute fracture or dislocation. Mixed lytic and sclerotic lesion demonstrating arc like calcifications within the proximal left femur most in keeping with an enchondroma. No osseous erosions. Normal alignment of the bones of the feet bilaterally. No acute fracture or dislocation. Joint spaces are preserved. No osseous erosions identified. Small bilateral superior and plantar calcaneal spurs. Ligaments Suboptimally assessed by CT. Muscles and Tendons Extensive intrafascial edema  within the thighs bilaterally. The musculotendinous structures of the lower extremities bilaterally are otherwise unremarkable. Soft tissues Extensive subcutaneous edema within the lower extremities bilaterally more  severe centrally, particularly within the hips and thighs. Moderate pedal edema bilaterally. No loculated subcutaneous fluid collections are identified. No subcutaneous gas identified. No retained radiopaque foreign body identified. IMPRESSION: 1. Extensive subcutaneous edema within the lower extremities bilaterally more severe centrally, particularly within the hips and thighs. Moderate pedal edema bilaterally. No loculated subcutaneous fluid collections are identified. No subcutaneous gas identified. No retained radiopaque foreign body identified. 2. No acute fracture or dislocation. No osseous erosions identified. 3. 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 4. Small bilateral superior and plantar calcaneal spurs. Electronically Signed   By: Helyn Numbers M.D.   On: 04/28/2023 03:20   CT FOOT RIGHT WO CONTRAST Result Date: 04/17/2023 CLINICAL DATA:  Necrotizing fasciitis suspected, lower leg, xray done EXAM: CT OF THE FEMURS BILATERALLY WITHOUT CONTRAST. CT OF THE LOWER EXTREMITIES BILATERALLY WITHOUT CONTRAST CT OF THE FEET BILATERALLY WITHOUT CONTRAST TECHNIQUE: Multidetector CT imaging of the lower left extremity was performed according to the standard protocol. RADIATION DOSE REDUCTION: This exam was performed according to the departmental dose-optimization program which includes automated exposure control, adjustment of the mA and/or kV according to patient size and/or use of iterative reconstruction technique. COMPARISON:  None Available. FINDINGS: Bones/Joint/Cartilage Mild motion artifact limits evaluation of the distal right femur. Normal alignment of the  hips bilaterally. No acute fracture or dislocation. Mild bilateral degenerative hip arthritis. No lytic or blastic bone lesion identified. No osseous erosions are identified. Normal alignment of the knees bilaterally. Mild to moderate bicompartmental degenerative arthritis involving the lateral medial compartments, most severe within the medial compartment bilateral no acute fracture or dislocation. Mixed lytic and sclerotic lesion demonstrating arc like calcifications within the proximal left femur most in keeping with an enchondroma. No osseous erosions. Normal alignment of the bones of the feet bilaterally. No acute fracture or dislocation. Joint spaces are preserved. No osseous erosions identified. Small bilateral superior and plantar calcaneal spurs. Ligaments Suboptimally assessed by CT. Muscles and Tendons Extensive intrafascial edema within the thighs bilaterally. The musculotendinous structures of the lower extremities bilaterally are otherwise unremarkable. Soft tissues Extensive subcutaneous edema within the lower extremities bilaterally more severe centrally, particularly within the hips and thighs. Moderate pedal edema bilaterally. No loculated subcutaneous fluid collections are identified. No subcutaneous gas identified. No retained radiopaque foreign body identified. IMPRESSION: 1. Extensive subcutaneous edema within the lower extremities bilaterally more severe centrally, particularly within the hips and thighs. Moderate pedal edema bilaterally. No loculated subcutaneous fluid collections are identified. No subcutaneous gas identified. No retained radiopaque foreign body identified. 2. No acute fracture or dislocation. No osseous erosions identified. 3. 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 4. Small bilateral superior and plantar calcaneal spurs. Electronically  Signed   By: Helyn Numbers M.D.   On: 04/23/2023 03:20   CT ABDOMEN PELVIS WO CONTRAST Result Date: 05/03/2023 CLINICAL DATA:  Acute kidney failure, necrotizing fasciitis EXAM: CT ABDOMEN AND PELVIS WITHOUT CONTRAST TECHNIQUE: Multidetector CT imaging of the abdomen and pelvis was performed following the standard protocol without IV contrast. RADIATION DOSE REDUCTION: This exam was performed according to the departmental dose-optimization program which includes automated exposure control, adjustment of the mA and/or kV according to patient size and/or use of iterative reconstruction technique. COMPARISON:  None Available. FINDINGS: Lower chest: Small bilateral pleural effusions with associated bibasilar compressive atelectasis. Mild cardiomegaly.  Extensive subcutaneous body wall edema within the thorax. Hepatobiliary: Mild hepatomegaly. No focal liver abnormality is seen. No gallstones, gallbladder wall thickening, or biliary dilatation. Pancreas: Unremarkable Spleen: Unremarkable Adrenals/Urinary Tract: Adrenal glands are unremarkable. Kidneys are normal, without renal calculi, focal lesion, or hydronephrosis. Bladder is unremarkable. Stomach/Bowel: Stomach, small bowel, and large bowel are unremarkable. The appendix is not identified and is likely absent. Mild ascites. No free intraperitoneal gas. Vascular/Lymphatic: Aortic atherosclerosis. Pathologically enlarged, enhancing bilateral inguinal lymph nodes present, possibly reactive, inflammatory, or lymphoproliferative in nature. Reproductive: Uterus and bilateral adnexa are unremarkable. Other: Extensive diffuse subcutaneous body wall edema. Retroperitoneal edema. Extensive subcutaneous edema within the visualized lower extremities bilaterally. No subcutaneous gas identified. Musculoskeletal: No acute or significant osseous findings. IMPRESSION: 1. Extensive diffuse subcutaneous body wall edema within the thorax, abdomen, and pelvis. No subcutaneous gas  identified. 2. Small bilateral pleural effusions, retroperitoneal edema, and mild ascites in keeping with changes of anasarca. 3. Mild cardiomegaly. 4. Mild hepatomegaly. 5. Pathologically enlarged, enhancing bilateral inguinal lymph nodes, possibly reactive, inflammatory, or lymphoproliferative in nature. Aortic Atherosclerosis (ICD10-I70.0). Electronically Signed   By: Helyn Numbers M.D.   On: 05/02/2023 03:05   DG Ankle Left Port Result Date: 05/08/2023 CLINICAL DATA:  Left ankle swelling, wound EXAM: PORTABLE LEFT ANKLE - 2 VIEW COMPARISON:  None Available. FINDINGS: Normal alignment. No acute fracture or dislocation. Ankle mortise is aligned. No osseous erosion. Small plantar calcaneal spur. Diffuse extensive soft tissue swelling of the visualized left ankle. Shallow wound noted within the subcutaneous soft tissues of the posterior left lower extremity at the level of the distal diaphysis of the tibia IMPRESSION: 1. Diffuse extensive soft tissue swelling. Shallow wound within the subcutaneous soft tissues of the posterior left lower extremity at the level of the distal diaphysis of the tibia. No osseous erosion. Electronically Signed   By: Helyn Numbers M.D.   On: 04/16/2023 02:03   DG CHEST PORT 1 VIEW Result Date: 04/13/2023 CLINICAL DATA:  Tachypnea. EXAM: PORTABLE CHEST 1 VIEW COMPARISON:  03/31/2023 FINDINGS: The heart is enlarged. Stable mediastinal contours. Aortic atherosclerosis. Suspected small bilateral pleural effusions. Vascular congestion. No pneumothorax. No confluent airspace disease IMPRESSION: Cardiomegaly with vascular congestion and suspected small bilateral pleural effusions. Electronically Signed   By: Narda Rutherford M.D.   On: 04/18/2023 00:16   Medications:    Current Medications:  [MAR Hold] Chlorhexidine Gluconate Cloth  6 each Topical Daily   [MAR Hold] heparin  4,000 Units Intravenous Once   [MAR Hold] insulin aspart  0-9 Units Subcutaneous Q4H   [MAR Hold]  multivitamin with minerals  1 tablet Oral Daily   [MAR Hold] nystatin   Topical BID   [MAR Hold] rosuvastatin  40 mg Oral Daily   Infusions:  heparin     [MAR Hold] linezolid (ZYVOX) IV 600 mg (04/27/2023 1010)   [MAR Hold] norepinephrine (LEVOPHED) Adult infusion 16 mcg/min (05/06/2023 1000)   [MAR Hold] piperacillin-tazobactam (ZOSYN)  IV 3.375 g (04/29/2023 1005)   vasopressin 0.03 Units/min (05/09/2023 1000)   Patient Profile   Catie Chiao is a 58 yo with PMH of DVT, HLD, HTN, T2DM, chronic LE edema, PVD, and nonhealing wounds.  Presenting with septic cellulitis and RV failure.   Assessment/Plan   RV Failure pHTN H/o DVT - Echo: EF 60-65, G1DD, D-shaped septum, mod reduced RV, RVSP 90.3 mmHg, mod TR, dilated IVC - likely WHO Group IV. Concern for CTPEH as well as ?WHO group 2. 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  -  no DVT noted on prior doppler studies. Pending new studies today. - COOX 67 - CVP 15. Received 2L IVF + IV D5LR. Expect she will need diuresis after cath. Cr worse with IVF.  - may need inotropic support for RV and renal function, pending RHC - given her baseline functional status and comorbidities, she is not currently a candidate for MCS or advanced therapies. - RHC today with leave in swan  Informed Consent   Shared Decision Making/Informed Consent The risks [stroke (1 in 1000), death (1 in 1000), kidney failure [usually temporary] (1 in 500), bleeding (1 in 200), allergic reaction [possibly serious] (1 in 200)], benefits (diagnostic support and management of coronary artery disease) and alternatives of a cardiac catheterization and leave-in swan were discussed in detail with Ms. Nicholls and she is willing to proceed.    Septic Shock BLE Cellulitis/Chronic wounds PVD - admitted with draining, unhealing wounds. LA 2.2>4.9>3.5 - Bcx - NGTD, UA negative, RVP negative. WBC 13.4 - given IVF and started on linezolid and zosyn per CCM - continue NE for BP  support, wean as able - orthopedic consulted, negative imaging for necrotizing fasc. Signed off - prior DVT studies 2/25 w/o DVT, repeat today - vascular consulted, unable to perform ABIs d/t drainage. Needs CTA BLE (however limited by renal function). May need surgical debridement.   AKI: baseline Cr 1.0-1.1 - on admit 2.2 - up to 2.43 today with IVF - start diuresis post cath  T2DM Hypoglycemia - hgb A1c 7.4 - hold Jardiance (would not restart with wounds and baseline functional status) - hold Metformin - per CCM  Medication concerns reviewed with patient and pharmacy team. Barriers identified: Noncompliance. Does not have transportation to appointments.   Length of Stay: 1  CRITICAL CARE Performed by: [place] Lee  Total critical care time: 18 minutes  Critical care time was exclusive of separately billable procedures and treating other patients.  Critical care was necessary to treat or prevent imminent or life-threatening deterioration.  Critical care was time spent personally by me on the following activities: development of treatment plan with patient and/or surrogate as well as nursing, discussions with consultants, evaluation of patient's response to treatment, examination of patient, obtaining history from patient or surrogate, ordering and performing treatments and interventions, ordering and review of laboratory studies, ordering and review of radiographic studies, pulse oximetry and re-evaluation of patient's condition.  [place] Lee, NP  05/05/2023, 11:23 AM  Advanced Heart Failure Team Pager [phone] (M-F; 7a - 5p)  Please contact [facility] for night-coverage after hours (4p -7a ) and weekends on [email or web address]  Patient seen with NP.  I formulated plan and agree with the above note.   58 y.o. chronically ill patient with severe venous stasis ulcerations, DM2, prior DVT was admitted with nonhealing wounds and hypotension.  She was tachycardic and hypotension on  admission, treated for sepsis with antibiotics and started on pressors.   Echo today showed LV EF 60-65%, D-shaped septum with severe RV enlargement and moderate RV dysfunction, PASP 90 mmHg, moderate TR, dilated IVC.   RHC was done showing: RHC Procedural Findings (Norepinephrine 16): Hemodynamics (mmHg) RA mean 15 RV 83/16 PA 86/37, mean 52 PCWP mean 13 Oxygen saturations: PA 57% AO Unable to obtain noninvasively Cardiac Output (Thermo) 3.41  Cardiac Index (Thermo) 1.77 PVR 11 WU SVR 1525 PAPi 3.2   Swan was left in place.   General: NAD Neck: JVP 14-16 cm, no thyromegaly or thyroid nodule.  Lungs: Clear to auscultation bilaterally  with normal respiratory effort. CV: Nondisplaced PMI.  Heart regular S1/S2, no S3/S4, no murmur.  2+ edema to knees.  No carotid bruit.  Unable to palpate pedal pulses.  Abdomen: Soft, nontender, no hepatosplenomegaly, no distention.  Skin: Intact without lesions or rashes.  Neurologic: Alert and oriented x 3.  Psych: Normal affect. Extremities: Venous stasis ulcerations bilateral lower legs.  HEENT: Normal.   1. Shock: Suspect primarily cardiogenic shock from RV failure but septic shock may play a role (elevated PCT).  RHC today with severe pulmonary artery hypertension and low cardiac output, SVR was high on NE at 1525.  - Continue NE 16.  - Start milrinone 0.125 and increase to 0.25 as long as she tolerates.  2. 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  Echo today showed LV EF 60-65%, D-shaped septum with severe RV enlargement and moderate RV dysfunction, PASP 90 mmHg, moderate TR, dilated IVC.  I suspect that RV changes are long-standing given the size of the RV.  She has severe pulmonary arterial hypertension with elevated RA pressure, normal PCWP and low CO.   - Support MAP with NE.  - Starting milrinone as above.  - Will start Veletri inhaled for PAH.  - Will give IV Lasix later  today once she is on milrinone.  - With baseline debility/chronic illness and open wounds, I do not think she would be a good candidate for any form of mechanical support.  3. Pulmonary arterial hypertension: Severe. She has history of DVT and has not been anticoagulated.  She is sedentary, spends most of day in bed.  Chronic thromboembolic pulmonary hypertension is certainly a possibility.  No known rheumatologic disease and she does not smoke.  - Check legs for DVT, had negative study in 2/25.  - Send serologic workup.  - Eventual V/Q scan would be ideal.  - Milrinone and Veletri as above.  4. ID: PCT 6.33, WBCs 13.  Concern for infection from lower extremity ulcerations.  - Continue Zosyn/linezolid.  5. Lower leg ulcerations: Thought to be venous stasis ulcers, have been chronic.  - Involve wound care.  - Unable to get peripheral arterial dopplers/ABIs due to drainage from ulcerations.  6. AKI: Suspect due to shock.  Baseline creatinine 1.0, up to 2.43 today.  - Maintain MAP and CO.   CRITICAL CARE Performed by: Marca Ancona  Total critical care time: 50 minutes  Critical care time was exclusive of separately billable procedures and treating other patients.  Critical care was necessary to treat or prevent imminent or life-threatening deterioration.  Critical care was time spent personally by me on the following activities: development of treatment plan with patient and/or surrogate as well as nursing, discussions with consultants, evaluation of patient's response to treatment, examination of patient, obtaining history from patient or surrogate, ordering and performing treatments and interventions, ordering and review of laboratory studies, ordering and review of radiographic studies, pulse oximetry and re-evaluation of patient's condition.  Marca Ancona 04/27/2023 2:12 PM

## 2023-05-10 NOTE — Progress Notes (Addendum)
 Pharmacy Antibiotic Note  Sarah Macdonald is a 58 y.o. female admitted on 04/19/2023 with  wound infection .  Pharmacy has been consulted for zosyn and linezolid dosing.  Wound with increased purulent drainage of BLE wounds on presentation. CT showing extensive subQ edema - no fluid collections or gas identified. WBC increased to 13, afebrile. LA 4.5>3.5. Scr increasing to 2.4 (baseline looks ~1).  Plan: Start linezolid 600 mg IV every 12 hours  Start zosyn 3.375g IV every 8 hours  Monitor renal fx, cx results, clinical pic, and levels as needed  Height: 5\' 6"  (167.6 cm) Weight: 83.3 kg (183 lb 10.3 oz) IBW/kg (Calculated) : 59.3  Temp (24hrs), Avg:97.8 F (36.6 C), Min:96.7 F (35.9 C), Max:98.6 F (37 C)  Recent Labs  Lab 04/19/23 1618 04/19/23 1857 04/19/23 2252 04/24/2023 0100 05/07/2023 0429 04/16/2023 0430 04/14/2023 0458  WBC 12.5*  --   --   --  13.4*  --   --   CREATININE 2.21*  --   --   --   --   --  2.43*  LATICACIDVEN  --  2.3* 4.5* 4.9*  --  3.5*  --     Estimated Creatinine Clearance: 27.4 mL/min (A) (by C-G formula based on SCr of 2.43 mg/dL (H)).    Allergies  Allergen Reactions   Cortisone Swelling    Antimicrobials this admission: Cefepime/vanc 3/11 x1 Metronidazole 3/12 x1 Linezolid 3/12>> Zosyn 3/12>>  Dose adjustments this admission: N/A  Microbiology results: MRSA PCR 3/12: neg  Bcx 3/11: ngtd   Thank you for allowing pharmacy to participate in this patient's care,  Sherron Monday, PharmD, BCCCP Clinical Pharmacist  Phone: [phone] 04/15/2023 9:14 AM  Please check AMION for all [facility] phone numbers After 10:00 PM, call Main Pharmacy [phone]

## 2023-05-10 NOTE — Death Summary Note (Signed)
 DEATH SUMMARY   Patient Details  Name: Sarah Macdonald MRN: [id number] DOB: 1965/12/25  Admission/Discharge Information   Admit Date:  2023-04-23  Date of Death: Date of Death: 2023/04/24  Time of Death: Time of Death: 1954  Length of Stay: 1  Referring Physician: Glendale Chard, DO    Diagnoses  Preliminary cause of death: Right heart failure x 10 years Secondary Diagnoses (including complications and co-morbidities):  Acute on chronic cor pulmonale Cardiogenic shock Cardiorenal syndrome  Brief Hospital Course (including significant findings, care, treatment, and services provided and events leading to death)  58yo chronically ill female who was admitted to ICU early AM for FTT, septic and cardiogenic shock on vasopressors. Initially presented to ED with fatigue, BLE wounds, hypotension, hypoglycemia. She received judicious IVF resuscitation and was started on vasopressors. CCM was consulted for admission due to ongoing hypotension, increasing lactic acid.   Patient's echo and heart cath consistent with end stage right heart failure thought to be some combination groups 1/4.  Started on iNO and inotropes.  Despite this support she continued to deteriorate.  After discussions with family we transitioned to comfort and she passed peacefully.  Pertinent Labs and Studies  Significant Diagnostic Studies VAS [place] LOWER EXTREMITY VENOUS (DVT) Result Date: 04/24/2023  Lower Venous DVT Study Patient Name:  HENLEY BLYTH  Date of Exam:   Apr 24, 2023 Medical Rec #: [id number]            Accession #:    [id number] Date of Birth: 30-Oct-1965            Patient Gender: F Patient Age:   58 years Exam Location:  [facility] Procedure:      VAS [place] LOWER EXTREMITY VENOUS (DVT) Referring Phys: NAVEED [facility] --------------------------------------------------------------------------------  Indications: Edema.  Limitations: Open wounds and bandages, poor ultrasound/tissue interface. Comparison Study:  Previous exam on 01/08/2019 was positive for DVT in FV & PopV Performing Technologist: Ernestene Mention RVT, RDMS  Examination Guidelines: A complete evaluation includes B-mode imaging, spectral Doppler, color Doppler, and power Doppler as needed of all accessible portions of each vessel. Bilateral testing is considered an integral part of a complete examination. Limited examinations for reoccurring indications may be performed as noted. The reflux portion of the exam is performed with the patient in reverse Trendelenburg.  +--------+---------------+---------+-----------+----------+--------------------+ RIGHT   CompressibilityPhasicitySpontaneityPropertiesThrombus Aging       +--------+---------------+---------+-----------+----------+--------------------+ CFV     Full           No       Yes        pulsatile                      +--------+---------------+---------+-----------+----------+--------------------+ SFJ     Full                                                              +--------+---------------+---------+-----------+----------+--------------------+ FV Prox Full           Yes      Yes                                       +--------+---------------+---------+-----------+----------+--------------------+ FV Mid  Full  Yes      Yes                                       +--------+---------------+---------+-----------+----------+--------------------+ FV      Full           Yes      Yes                                       Distal                                                                    +--------+---------------+---------+-----------+----------+--------------------+ PFV                    No       Yes        pulsatile patent by                                                                 color/doppler        +--------+---------------+---------+-----------+----------+--------------------+ POP     Full                                                               +--------+---------------+---------+-----------+----------+--------------------+ PTV                                                  not visualized       +--------+---------------+---------+-----------+----------+--------------------+ PERO                                                 not visualized       +--------+---------------+---------+-----------+----------+--------------------+   Right Technical Findings: Not visualized segments include Peroneal and posterior tibial veins.  +---------+---------------+---------+-----------+----------+--------------+ LEFT     CompressibilityPhasicitySpontaneityPropertiesThrombus Aging +---------+---------------+---------+-----------+----------+--------------+ CFV      Full           No       Yes        pulsatile                +---------+---------------+---------+-----------+----------+--------------+ SFJ      Full                                                        +---------+---------------+---------+-----------+----------+--------------+  FV Prox  Full           No       Yes        pulsatile                +---------+---------------+---------+-----------+----------+--------------+ FV Mid   Full           Yes      Yes                                 +---------+---------------+---------+-----------+----------+--------------+ FV DistalFull           Yes      Yes                                 +---------+---------------+---------+-----------+----------+--------------+ POP      Full           Yes      Yes                                 +---------+---------------+---------+-----------+----------+--------------+ PTV                                                   not visualized +---------+---------------+---------+-----------+----------+--------------+ PERO                                                  not visualized  +---------+---------------+---------+-----------+----------+--------------+   Left Technical Findings: Not visualized segments include Peroneal and posterior tibial veins.   Summary: BILATERAL: - No evidence of deep vein thrombosis seen in the lower extremities bilaterally, however portions of this exam were limited. -No evidence of popliteal cyst, bilaterally. - Ultrasound characteristics of enlarged lymph nodes noted in the both groins. -Diffuse subcutaneous edema bilaterally, L>R.  *See table(s) above for measurements and observations. Electronically signed by Coral Else MD on  at 8:41:37 PM.    Final    US Abdomen Limited RUQ (LIVER/GB) Result Date:  CLINICAL DATA:  Abdominal pain EXAM: ULTRASOUND ABDOMEN LIMITED RIGHT UPPER QUADRANT COMPARISON:  CT  FINDINGS: Gallbladder: Gallbladder is mildly distended. There is wall thickening diffusely of up to 6 mm. Some sludge identified. No obvious stones. Some adjacent free fluid as well. Common bile duct: Diameter: 2 mm Liver: Slightly heterogeneous liver parenchyma. Portal vein is patent on color Doppler imaging with normal direction of blood flow towards the liver. Other: Small pleural effusion. IMPRESSION: Slightly heterogeneous liver. Mild ascites. Right pleural effusion. Distended gallbladder with wall thickening and sludge. No shadowing stones. The wall thickening is nonspecific with the other findings. Please correlate with clinical history and additional workup as clinically appropriate. Electronically Signed   By: Karen Kays M.D.   On:  18:04   CARDIAC CATHETERIZATION Result Date:  1. Severe pulmonary arterial hypertension. 2. Normal PCWP, elevated RA pressure. 3. Low cardiac output though PAPi is preserved. 4. PVR and SVR both high. Adding milrinone 0.125, can increase to 0.25 if tolerated.  Will initiate Veletri inhaled.  Lasix bolus later today.   ECHOCARDIOGRAM COMPLETE Result Date:   ECHOCARDIOGRAM REPORT   Patient Name:   NIKI PAYMENT Date of Exam:  Medical Rec #:  [id number]           Height:       66.0 in Accession #:    [id number]          Weight:       183.6 lb Date of Birth:  [birth year]           BSA:          1.929 m Patient Age:    57 years            BP:           97/63 mmHg Patient Gender: F                   HR:           106 bpm. Exam Location:  Inpatient Procedure: 2D Echo, Color Doppler and Cardiac Doppler (Both Spectral and Color            Flow Doppler were utilized during procedure). Indications:     Murmur R01.1  History:         Patient has no prior history of Echocardiogram examinations.                  Risk Factors:Dyslipidemia and Hypertension.  Sonographer:     Harriette Bouillon RDCS Referring Phys:  [id number] Darl Householder RUMBALL Diagnosing Phys: Wilfred Lacy IMPRESSIONS  1. Left ventricular ejection fraction, by estimation, is 60 to 65%. The left ventricle has normal function. The left ventricle has no regional wall motion abnormalities. Left ventricular diastolic parameters are consistent with Grade I diastolic dysfunction (impaired relaxation).  2. D-shaped septum suggestive of RV pressure/volume overload. Right ventricular systolic function is moderately reduced. The right ventricular size is severely enlarged. There is severely elevated pulmonary artery systolic pressure. The estimated right ventricular systolic pressure is 90.3 mmHg.  3. Right atrial size was severely dilated.  4. The mitral valve is normal in structure. Trivial mitral valve regurgitation. No evidence of mitral stenosis.  5. The tricuspid valve is abnormal. Tricuspid valve regurgitation is moderate.  6. The aortic valve is tricuspid. Aortic valve regurgitation is not visualized. No aortic stenosis is present.  7. The inferior vena cava is dilated in size with <50% respiratory variability, suggesting right atrial pressure of 15 mmHg.  8. A small pericardial effusion is present. The  pericardial effusion is circumferential. FINDINGS  Left Ventricle: Left ventricular ejection fraction, by estimation, is 60 to 65%. The left ventricle has normal function. The left ventricle has no regional wall motion abnormalities. The left ventricular internal cavity size was normal in size. There is  no left ventricular hypertrophy. Left ventricular diastolic parameters are consistent with Grade I diastolic dysfunction (impaired relaxation). Right Ventricle: D-shaped septum suggestive of RV pressure/volume overload. The right ventricular size is severely enlarged. No increase in right ventricular wall thickness. Right ventricular systolic function is moderately reduced. There is severely elevated pulmonary artery systolic pressure. The tricuspid regurgitant velocity is 4.34 m/s, and with an assumed right atrial pressure of 15 mmHg, the estimated right ventricular systolic pressure is 90.3 mmHg. Left Atrium: Left atrial size was normal in size. Right Atrium: Right atrial size was severely dilated. Pericardium: A small pericardial effusion is present. The pericardial effusion is circumferential. Mitral Valve: The mitral valve is normal in structure. Trivial mitral valve regurgitation. No evidence of mitral valve stenosis. Tricuspid Valve: The  tricuspid valve is abnormal. Tricuspid valve regurgitation is moderate. Aortic Valve: The aortic valve is tricuspid. Aortic valve regurgitation is not visualized. No aortic stenosis is present. Pulmonic Valve: The pulmonic valve was normal in structure. Pulmonic valve regurgitation is trivial. Aorta: The aortic root is normal in size and structure. Venous: The inferior vena cava is dilated in size with less than 50% respiratory variability, suggesting right atrial pressure of 15 mmHg. IAS/Shunts: No atrial level shunt detected by color flow Doppler.  LEFT VENTRICLE PLAX 2D LVIDd:         3.60 cm   Diastology LVIDs:         1.70 cm   LV e' medial:    5.11 cm/s LV PW:          1.10 cm   LV E/e' medial:  10.1 LV IVS:        1.00 cm   LV e' lateral:   10.40 cm/s LVOT diam:     1.80 cm   LV E/e' lateral: 4.9 LV SV:         25 LV SV Index:   13 LVOT Area:     2.54 cm  RIGHT VENTRICLE             IVC RV S prime:     11.60 cm/s  IVC diam: 2.80 cm TAPSE (M-mode): 1.2 cm LEFT ATRIUM           Index        RIGHT ATRIUM           Index LA diam:      3.10 cm 1.61 cm/m   RA Area:     44.60 cm LA Vol (A4C): 34.4 ml 17.84 ml/m  RA Volume:   201.00 ml 104.22 ml/m  AORTIC VALVE LVOT Vmax:   73.80 cm/s LVOT Vmean:  51.200 cm/s LVOT VTI:    0.100 m  AORTA Ao Root diam: 3.20 cm Ao Asc diam:  3.40 cm MITRAL VALVE               TRICUSPID VALVE MV Area (PHT): 4.17 cm    TR Peak grad:   75.3 mmHg MV Decel Time: 182 msec    TR Vmax:        434.00 cm/s MV E velocity: 51.40 cm/s MV A velocity: 60.00 cm/s  SHUNTS MV E/A ratio:  0.86        Systemic VTI:  0.10 m                            Systemic Diam: 1.80 cm Dalton McleanMD Electronically signed by Wilfred Lacy Signature Date/Time: /9:16:52 AM    Final (Updated)    DG CHEST PORT 1 VIEW Result Date:  CLINICAL DATA:  [id number] Migration of central venous catheter (CVC) (HCC) [id number] EXAM: PORTABLE CHEST 1 VIEW COMPARISON:  04/19/2023 FINDINGS: Interval placement of right internal jugular approach central venous catheter with distal tip terminating at the superior cavoatrial junction. Stable cardiomegaly. Aortic atherosclerosis. Mild pulmonary vascular congestion. No new airspace consolidation. No large pleural fluid collection. No pneumothorax. IMPRESSION: Interval placement of right internal jugular approach central venous catheter with distal tip terminating at the superior cavoatrial junction. No pneumothorax. Electronically Signed   By: Duanne Guess D.O.   On:  11:01   CT FEMUR LEFT WO CONTRAST Result Date:  CLINICAL DATA:  Necrotizing fasciitis suspected, lower leg, xray done EXAM: CT OF THE FEMURS  BILATERALLY WITHOUT CONTRAST. CT OF THE LOWER EXTREMITIES BILATERALLY WITHOUT CONTRAST CT OF THE FEET BILATERALLY WITHOUT CONTRAST TECHNIQUE: Multidetector CT imaging of the lower left extremity was performed according to the standard protocol. RADIATION DOSE REDUCTION: This exam was performed according to the departmental dose-optimization program which includes automated exposure control, adjustment of the mA and/or kV according to patient size and/or use of iterative reconstruction technique. COMPARISON:  None Available. FINDINGS: Bones/Joint/Cartilage Mild motion artifact limits evaluation of the distal right femur. Normal alignment of the hips bilaterally. No acute fracture or dislocation. Mild bilateral degenerative hip arthritis. No lytic or blastic bone lesion identified. No osseous erosions are identified. Normal alignment of the knees bilaterally. Mild to moderate bicompartmental degenerative arthritis involving the lateral medial compartments, most severe within the medial compartment bilateral no acute fracture or dislocation. Mixed lytic and sclerotic lesion demonstrating arc like calcifications within the proximal left femur most in keeping with an enchondroma. No osseous erosions. Normal alignment of the bones of the feet bilaterally. No acute fracture or dislocation. Joint spaces are preserved. No osseous erosions identified. Small bilateral superior and plantar calcaneal spurs. Ligaments Suboptimally assessed by CT. Muscles and Tendons Extensive intrafascial edema within the thighs bilaterally. The musculotendinous structures of the lower extremities bilaterally are otherwise unremarkable. Soft tissues Extensive subcutaneous edema within the lower extremities bilaterally more severe centrally, particularly within the hips and thighs. Moderate pedal edema bilaterally. No loculated subcutaneous fluid collections are identified. No subcutaneous gas identified. No retained radiopaque foreign body  identified. IMPRESSION: 1. Extensive subcutaneous edema within the lower extremities bilaterally more severe centrally, particularly within the hips and thighs. Moderate pedal edema bilaterally. No loculated subcutaneous fluid collections are identified. No subcutaneous gas identified. No retained radiopaque foreign body identified. 2. No acute fracture or dislocation. No osseous erosions identified. 3. 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 4. Small bilateral superior and plantar calcaneal spurs. Electronically Signed   By: Helyn Numbers M.D.   On:  03:20   CT FEMUR RIGHT WO CONTRAST Result Date:  CLINICAL DATA:  Necrotizing fasciitis suspected, lower leg, xray done EXAM: CT OF THE FEMURS BILATERALLY WITHOUT CONTRAST. CT OF THE LOWER EXTREMITIES BILATERALLY WITHOUT CONTRAST CT OF THE FEET BILATERALLY WITHOUT CONTRAST TECHNIQUE: Multidetector CT imaging of the lower left extremity was performed according to the standard protocol. RADIATION DOSE REDUCTION: This exam was performed according to the departmental dose-optimization program which includes automated exposure control, adjustment of the mA and/or kV according to patient size and/or use of iterative reconstruction technique. COMPARISON:  None Available. FINDINGS: Bones/Joint/Cartilage Mild motion artifact limits evaluation of the distal right femur. Normal alignment of the hips bilaterally. No acute fracture or dislocation. Mild bilateral degenerative hip arthritis. No lytic or blastic bone lesion identified. No osseous erosions are identified. Normal alignment of the knees bilaterally. Mild to moderate bicompartmental degenerative arthritis involving the lateral medial compartments, most severe within the medial compartment bilateral no acute fracture or dislocation. Mixed lytic and sclerotic lesion  demonstrating arc like calcifications within the proximal left femur most in keeping with an enchondroma. No osseous erosions. Normal alignment of the bones of the feet bilaterally. No acute fracture or dislocation. Joint spaces are preserved. No osseous erosions identified. Small bilateral superior and plantar calcaneal spurs. Ligaments Suboptimally assessed by CT. Muscles and Tendons Extensive intrafascial edema within the thighs bilaterally. The musculotendinous structures of the lower extremities bilaterally are otherwise unremarkable.  Soft tissues Extensive subcutaneous edema within the lower extremities bilaterally more severe centrally, particularly within the hips and thighs. Moderate pedal edema bilaterally. No loculated subcutaneous fluid collections are identified. No subcutaneous gas identified. No retained radiopaque foreign body identified. IMPRESSION: 1. Extensive subcutaneous edema within the lower extremities bilaterally more severe centrally, particularly within the hips and thighs. Moderate pedal edema bilaterally. No loculated subcutaneous fluid collections are identified. No subcutaneous gas identified. No retained radiopaque foreign body identified. 2. No acute fracture or dislocation. No osseous erosions identified. 3. 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 4. Small bilateral superior and plantar calcaneal spurs. Electronically Signed   By: Helyn Numbers M.D.   On:  03:20   CT TIBIA FIBULA LEFT WO CONTRAST Result Date:  CLINICAL DATA:  Necrotizing fasciitis suspected, lower leg, xray done EXAM: CT OF THE FEMURS BILATERALLY WITHOUT CONTRAST. CT OF THE LOWER EXTREMITIES BILATERALLY WITHOUT CONTRAST CT OF THE FEET BILATERALLY WITHOUT CONTRAST TECHNIQUE: Multidetector CT imaging of the lower left extremity was performed according to the  standard protocol. RADIATION DOSE REDUCTION: This exam was performed according to the departmental dose-optimization program which includes automated exposure control, adjustment of the mA and/or kV according to patient size and/or use of iterative reconstruction technique. COMPARISON:  None Available. FINDINGS: Bones/Joint/Cartilage Mild motion artifact limits evaluation of the distal right femur. Normal alignment of the hips bilaterally. No acute fracture or dislocation. Mild bilateral degenerative hip arthritis. No lytic or blastic bone lesion identified. No osseous erosions are identified. Normal alignment of the knees bilaterally. Mild to moderate bicompartmental degenerative arthritis involving the lateral medial compartments, most severe within the medial compartment bilateral no acute fracture or dislocation. Mixed lytic and sclerotic lesion demonstrating arc like calcifications within the proximal left femur most in keeping with an enchondroma. No osseous erosions. Normal alignment of the bones of the feet bilaterally. No acute fracture or dislocation. Joint spaces are preserved. No osseous erosions identified. Small bilateral superior and plantar calcaneal spurs. Ligaments Suboptimally assessed by CT. Muscles and Tendons Extensive intrafascial edema within the thighs bilaterally. The musculotendinous structures of the lower extremities bilaterally are otherwise unremarkable. Soft tissues Extensive subcutaneous edema within the lower extremities bilaterally more severe centrally, particularly within the hips and thighs. Moderate pedal edema bilaterally. No loculated subcutaneous fluid collections are identified. No subcutaneous gas identified. No retained radiopaque foreign body identified. IMPRESSION: 1. Extensive subcutaneous edema within the lower extremities bilaterally more severe centrally, particularly within the hips and thighs. Moderate pedal edema bilaterally. No loculated subcutaneous fluid  collections are identified. No subcutaneous gas identified. No retained radiopaque foreign body identified. 2. No acute fracture or dislocation. No osseous erosions identified. 3. 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 4. Small bilateral superior and plantar calcaneal spurs. Electronically Signed   By: Helyn Numbers M.D.   On:  03:20   CT TIBIA FIBULA RIGHT WO CONTRAST Result Date:  CLINICAL DATA:  Necrotizing fasciitis suspected, lower leg, xray done EXAM: CT OF THE FEMURS BILATERALLY WITHOUT CONTRAST. CT OF THE LOWER EXTREMITIES BILATERALLY WITHOUT CONTRAST CT OF THE FEET BILATERALLY WITHOUT CONTRAST TECHNIQUE: Multidetector CT imaging of the lower left extremity was performed according to the standard protocol. RADIATION DOSE REDUCTION: This exam was performed according to the departmental dose-optimization program which includes automated exposure control, adjustment of the mA and/or  kV according to patient size and/or use of iterative reconstruction technique. COMPARISON:  None Available. FINDINGS: Bones/Joint/Cartilage Mild motion artifact limits evaluation of the distal right femur. Normal alignment of the hips bilaterally. No acute fracture or dislocation. Mild bilateral degenerative hip arthritis. No lytic or blastic bone lesion identified. No osseous erosions are identified. Normal alignment of the knees bilaterally. Mild to moderate bicompartmental degenerative arthritis involving the lateral medial compartments, most severe within the medial compartment bilateral no acute fracture or dislocation. Mixed lytic and sclerotic lesion demonstrating arc like calcifications within the proximal left femur most in keeping with an enchondroma. No osseous erosions. Normal alignment of the bones of the feet bilaterally. No acute fracture or dislocation.  Joint spaces are preserved. No osseous erosions identified. Small bilateral superior and plantar calcaneal spurs. Ligaments Suboptimally assessed by CT. Muscles and Tendons Extensive intrafascial edema within the thighs bilaterally. The musculotendinous structures of the lower extremities bilaterally are otherwise unremarkable. Soft tissues Extensive subcutaneous edema within the lower extremities bilaterally more severe centrally, particularly within the hips and thighs. Moderate pedal edema bilaterally. No loculated subcutaneous fluid collections are identified. No subcutaneous gas identified. No retained radiopaque foreign body identified. IMPRESSION: 1. Extensive subcutaneous edema within the lower extremities bilaterally more severe centrally, particularly within the hips and thighs. Moderate pedal edema bilaterally. No loculated subcutaneous fluid collections are identified. No subcutaneous gas identified. No retained radiopaque foreign body identified. 2. No acute fracture or dislocation. No osseous erosions identified. 3. 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 4. Small bilateral superior and plantar calcaneal spurs. Electronically Signed   By: Helyn Numbers M.D.   On:  03:20   CT FOOT LEFT WO CONTRAST Result Date:  CLINICAL DATA:  Necrotizing fasciitis suspected, lower leg, xray done EXAM: CT OF THE FEMURS BILATERALLY WITHOUT CONTRAST. CT OF THE LOWER EXTREMITIES BILATERALLY WITHOUT CONTRAST CT OF THE FEET BILATERALLY WITHOUT CONTRAST TECHNIQUE: Multidetector CT imaging of the lower left extremity was performed according to the standard protocol. RADIATION DOSE REDUCTION: This exam was performed according to the departmental dose-optimization program which includes automated exposure control, adjustment of the mA and/or kV according to patient size  and/or use of iterative reconstruction technique. COMPARISON:  None Available. FINDINGS: Bones/Joint/Cartilage Mild motion artifact limits evaluation of the distal right femur. Normal alignment of the hips bilaterally. No acute fracture or dislocation. Mild bilateral degenerative hip arthritis. No lytic or blastic bone lesion identified. No osseous erosions are identified. Normal alignment of the knees bilaterally. Mild to moderate bicompartmental degenerative arthritis involving the lateral medial compartments, most severe within the medial compartment bilateral no acute fracture or dislocation. Mixed lytic and sclerotic lesion demonstrating arc like calcifications within the proximal left femur most in keeping with an enchondroma. No osseous erosions. Normal alignment of the bones of the feet bilaterally. No acute fracture or dislocation. Joint spaces are preserved. No osseous erosions identified. Small bilateral superior and plantar calcaneal spurs. Ligaments Suboptimally assessed by CT. Muscles and Tendons Extensive intrafascial edema within the thighs bilaterally. The musculotendinous structures of the lower extremities bilaterally are otherwise unremarkable. Soft tissues Extensive subcutaneous edema within the lower extremities bilaterally more severe centrally, particularly within the hips and thighs. Moderate pedal edema bilaterally. No loculated subcutaneous fluid collections are identified. No subcutaneous gas identified. No retained radiopaque foreign body identified. IMPRESSION: 1. Extensive subcutaneous edema within the lower extremities bilaterally more severe centrally, particularly within the hips  and thighs. Moderate pedal edema bilaterally. No loculated subcutaneous fluid collections are identified. No subcutaneous gas identified. No retained radiopaque foreign body identified. 2. No acute fracture or dislocation. No osseous erosions identified. 3. Mild bilateral degenerative hip arthritis. Mild to  moderate bicompartmental degenerative arthritis of the knees bilaterally, most severe within the medial compartment bilateral. 4. Small bilateral superior and plantar calcaneal spurs. Electronically Signed   By: Helyn Numbers M.D.   On:  03:20   CT FOOT RIGHT WO CONTRAST Result Date:  CLINICAL DATA:  Necrotizing fasciitis suspected, lower leg, xray done EXAM: CT OF THE FEMURS BILATERALLY WITHOUT CONTRAST. CT OF THE LOWER EXTREMITIES BILATERALLY WITHOUT CONTRAST CT OF THE FEET BILATERALLY WITHOUT CONTRAST TECHNIQUE: Multidetector CT imaging of the lower left extremity was performed according to the standard protocol. RADIATION DOSE REDUCTION: This exam was performed according to the departmental dose-optimization program which includes automated exposure control, adjustment of the mA and/or kV according to patient size and/or use of iterative reconstruction technique. COMPARISON:  None Available. FINDINGS: Bones/Joint/Cartilage Mild motion artifact limits evaluation of the distal right femur. Normal alignment of the hips bilaterally. No acute fracture or dislocation. Mild bilateral degenerative hip arthritis. No lytic or blastic bone lesion identified. No osseous erosions are identified. Normal alignment of the knees bilaterally. Mild to moderate bicompartmental degenerative arthritis involving the lateral medial compartments, most severe within the medial compartment bilateral no acute fracture or dislocation. Mixed lytic and sclerotic lesion demonstrating arc like calcifications within the proximal left femur most in keeping with an enchondroma. No osseous erosions. Normal alignment of the bones of the feet bilaterally. No acute fracture or dislocation. Joint spaces are preserved. No osseous erosions identified. Small bilateral superior and plantar calcaneal spurs. Ligaments Suboptimally assessed by CT. Muscles and Tendons Extensive intrafascial edema within the thighs bilaterally. The  musculotendinous structures of the lower extremities bilaterally are otherwise unremarkable. Soft tissues Extensive subcutaneous edema within the lower extremities bilaterally more severe centrally, particularly within the hips and thighs. Moderate pedal edema bilaterally. No loculated subcutaneous fluid collections are identified. No subcutaneous gas identified. No retained radiopaque foreign body identified. IMPRESSION: 1. Extensive subcutaneous edema within the lower extremities bilaterally more severe centrally, particularly within the hips and thighs. Moderate pedal edema bilaterally. No loculated subcutaneous fluid collections are identified. No subcutaneous gas identified. No retained radiopaque foreign body identified. 2. No acute fracture or dislocation. No osseous erosions identified. 3. 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 4. Small bilateral superior and plantar calcaneal spurs. Electronically Signed   By: Helyn Numbers M.D.   On:  03:20   CT ABDOMEN PELVIS WO CONTRAST Result Date:  CLINICAL DATA:  Acute kidney failure, necrotizing fasciitis EXAM: CT ABDOMEN AND PELVIS WITHOUT CONTRAST TECHNIQUE: Multidetector CT imaging of the abdomen and pelvis was performed following the standard protocol without IV contrast. RADIATION DOSE REDUCTION: This exam was performed according to the departmental dose-optimization program which includes automated exposure control, adjustment of the mA and/or kV according to patient size and/or use of iterative reconstruction technique. COMPARISON:  None Available. FINDINGS: Lower chest: Small bilateral pleural effusions with associated bibasilar compressive atelectasis. Mild cardiomegaly. Extensive subcutaneous body wall edema within the thorax. Hepatobiliary: Mild hepatomegaly. No focal liver abnormality is seen.  No gallstones, gallbladder wall thickening, or biliary dilatation. Pancreas: Unremarkable Spleen: Unremarkable Adrenals/Urinary Tract: Adrenal glands are unremarkable. Kidneys are normal, without renal calculi, focal lesion, or hydronephrosis.  Bladder is unremarkable. Stomach/Bowel: Stomach, small bowel, and large bowel are unremarkable. The appendix is not identified and is likely absent. Mild ascites. No free intraperitoneal gas. Vascular/Lymphatic: Aortic atherosclerosis. Pathologically enlarged, enhancing bilateral inguinal lymph nodes present, possibly reactive, inflammatory, or lymphoproliferative in nature. Reproductive: Uterus and bilateral adnexa are unremarkable. Other: Extensive diffuse subcutaneous body wall edema. Retroperitoneal edema. Extensive subcutaneous edema within the visualized lower extremities bilaterally. No subcutaneous gas identified. Musculoskeletal: No acute or significant osseous findings. IMPRESSION: 1. Extensive diffuse subcutaneous body wall edema within the thorax, abdomen, and pelvis. No subcutaneous gas identified. 2. Small bilateral pleural effusions, retroperitoneal edema, and mild ascites in keeping with changes of anasarca. 3. Mild cardiomegaly. 4. Mild hepatomegaly. 5. Pathologically enlarged, enhancing bilateral inguinal lymph nodes, possibly reactive, inflammatory, or lymphoproliferative in nature. Aortic Atherosclerosis (ICD10-I70.0). Electronically Signed   By: Helyn Numbers M.D.   On:  03:05   DG Ankle Left Port Result Date:  CLINICAL DATA:  Left ankle swelling, wound EXAM: PORTABLE LEFT ANKLE - 2 VIEW COMPARISON:  None Available. FINDINGS: Normal alignment. No acute fracture or dislocation. Ankle mortise is aligned. No osseous erosion. Small plantar calcaneal spur. Diffuse extensive soft tissue swelling of the visualized left ankle. Shallow wound noted within the subcutaneous soft tissues of the posterior left lower extremity at the level of the  distal diaphysis of the tibia IMPRESSION: 1. Diffuse extensive soft tissue swelling. Shallow wound within the subcutaneous soft tissues of the posterior left lower extremity at the level of the distal diaphysis of the tibia. No osseous erosion. Electronically Signed   By: Helyn Numbers M.D.   On:  02:03   DG CHEST PORT 1 VIEW Result Date:  CLINICAL DATA:  Tachypnea. EXAM: PORTABLE CHEST 1 VIEW COMPARISON:  03/31/2023 FINDINGS: The heart is enlarged. Stable mediastinal contours. Aortic atherosclerosis. Suspected small bilateral pleural effusions. Vascular congestion. No pneumothorax. No confluent airspace disease IMPRESSION: Cardiomegaly with vascular congestion and suspected small bilateral pleural effusions. Electronically Signed   By: Narda Rutherford M.D.   On:  00:16   DG Chest Portable 1 View Result Date: 03/31/2023 CLINICAL DATA:  piror concern for PNA.  Bilateral leg swelling. EXAM: PORTABLE CHEST 1 VIEW COMPARISON:  03/18/2023. FINDINGS: Redemonstration of mild central pulmonary vascular congestion however, improved since the prior study. Findings are also exacerbated by low lung volume. No frank pulmonary edema. There are probable atelectatic changes/scarring at the left lung base however, improved since the prior study. Bilateral lung fields are otherwise clear. No acute consolidation or lung collapse. Bilateral costophrenic angles are clear. Stable mildly enlarged cardio-mediastinal silhouette. No acute osseous abnormalities. The soft tissues are within normal limits. IMPRESSION: *Mild central pulmonary vascular congestion, improved since the prior study. No frank pulmonary edema. No acute consolidation or lung collapse. Electronically Signed   By: Jules Schick M.D.   On: 03/31/2023 16:04    Microbiology Recent Results (from the past 240 hours)  Resp panel by RT-PCR (RSV, Flu A&B, Covid) Anterior Nasal Swab     Status: None   Collection Time: 04/19/23  4:32 PM    Specimen: Anterior Nasal Swab  Result Value Ref Range Status   SARS Coronavirus 2 by RT PCR NEGATIVE NEGATIVE Final   Influenza A by PCR NEGATIVE NEGATIVE Final   Influenza B by PCR NEGATIVE NEGATIVE Final    Comment: (NOTE) The Xpert Xpress SARS-CoV-2/FLU/RSV plus assay is intended as an aid in the diagnosis of influenza from Nasopharyngeal swab specimens and should not be used as  a sole basis for treatment. Nasal washings and aspirates are unacceptable for Xpert Xpress SARS-CoV-2/FLU/RSV testing.  Fact Sheet for Patients: BloggerCourse.com  Fact Sheet for Healthcare Providers: SeriousBroker.it  This test is not yet approved or cleared by the [place] FDA and has been authorized for detection and/or diagnosis of SARS-CoV-2 by FDA under an Emergency Use Authorization (EUA). This EUA will remain in effect (meaning this test can be used) for the duration of the COVID-19 declaration under Section 564(b)(1) of the Act, 21 U.S.C. section 360bbb-3(b)(1), unless the authorization is terminated or revoked.     Resp Syncytial Virus by PCR NEGATIVE NEGATIVE Final    Comment: (NOTE) Fact Sheet for Patients: BloggerCourse.com  Fact Sheet for Healthcare Providers: SeriousBroker.it  This test is not yet approved or cleared by the [place] FDA and has been authorized for detection and/or diagnosis of SARS-CoV-2 by FDA under an Emergency Use Authorization (EUA). This EUA will remain in effect (meaning this test can be used) for the duration of the COVID-19 declaration under Section 564(b)(1) of the Act, 21 U.S.C. section 360bbb-3(b)(1), unless the authorization is terminated or revoked.  Performed at [facility], [address], [place], [place] [address]   Blood culture (routine x 2)     Status: None (Preliminary result)   Collection Time: 04/19/23  6:29 PM    Specimen: BLOOD  Result Value Ref Range Status   Specimen Description BLOOD RIGHT ANTECUBITAL  Final   Special Requests   Final    BOTTLES DRAWN AEROBIC AND ANAEROBIC Blood Culture results may not be optimal due to an inadequate volume of blood received in culture bottles   Culture   Final    NO GROWTH 4 DAYS Performed at [facility], [address] [place], [place] [address]    Report Status PENDING  Incomplete  Blood culture (routine x 2)     Status: None (Preliminary result)   Collection Time: 04/19/23  6:48 PM   Specimen: BLOOD RIGHT ARM  Result Value Ref Range Status   Specimen Description BLOOD RIGHT ARM  Final   Special Requests   Final    BOTTLES DRAWN AEROBIC AND ANAEROBIC Blood Culture results may not be optimal due to an inadequate volume of blood received in culture bottles   Culture   Final    NO GROWTH 4 DAYS Performed at [facility], [address], [place], [place] [address]    Report Status PENDING  Incomplete  MRSA Next Gen by PCR, Nasal     Status: None   Collection Time:   5:41 AM   Specimen: Nasal Mucosa; Nasal Swab  Result Value Ref Range Status   MRSA by PCR Next Gen NOT DETECTED NOT DETECTED Final    Comment: (NOTE) The GeneXpert MRSA Assay (FDA approved for NASAL specimens only), is one component of a comprehensive MRSA colonization surveillance program. It is not intended to diagnose MRSA infection nor to guide or monitor treatment for MRSA infections. Test performance is not FDA approved in patients less than 46 years old. Performed at [facility], [address], [place], [place] [address]     Lab Basic Metabolic Panel: Recent Labs  Lab 04/19/23 1618  0458  0616  1203  1351  1423  NA 130* 130* 134* 136  136 135 134*  K 5.1 4.8 5.1 4.9  5.0 4.9 5.1  CL 100 105  --   --   --   --  CO2 17* 16*  --   --   --   --   GLUCOSE 70 216*  --   --   --   --   BUN 37* 36*  --    --   --   --   CREATININE 2.21* 2.43*  --   --   --   --   CALCIUM 8.7* 8.1*  --   --   --   --   MG 1.7 1.6*  --   --   --   --   PHOS  --  4.1  --   --   --   --    Liver Function Tests: Recent Labs  Lab 04/19/23 1618  0458  AST 99* 238*  ALT 45* 75*  ALKPHOS 247* 231*  BILITOT 2.6* 2.6*  PROT 7.0 6.2*  ALBUMIN 2.6* 2.3*   No results for input(s): "LIPASE", "AMYLASE" in the last 168 hours. Recent Labs  Lab  0944  AMMONIA 37*   CBC: Recent Labs  Lab 04/19/23 1618  0429  0616  1203  1351  1423  WBC 12.5* 13.4*  --   --   --   --   NEUTROABS 11.4*  --   --   --   --   --   HGB 15.2* 12.1 14.3 14.3  14.3 13.9 13.6  HCT 45.4 35.4* 42.0 42.0  42.0 41.0 40.0  MCV 95.0 95.2  --   --   --   --   PLT 392 358  --   --   --   --    Cardiac Enzymes: No results for input(s): "CKTOTAL", "CKMB", "CKMBINDEX", "TROPONINI" in the last 168 hours. Sepsis Labs: Recent Labs  Lab 04/19/23 1618 04/19/23 1857 04/19/23 2252  0100  0429  0430  0944  PROCALCITON  --   --   --   --   --   --  6.33  WBC 12.5*  --   --   --  13.4*  --   --   LATICACIDVEN  --  2.3* 4.5* 4.9*  --  3.5*  --       Lorin Glass 04/23/2023, 5:26 PM

## 2023-05-10 NOTE — Progress Notes (Signed)
 RT attempted to retrieve abg from pt. But was unsuccessful. RN aware.

## 2023-05-10 NOTE — Consult Note (Addendum)
 WOC Nurse Consult Note: WOC team consulted previously for B leg wounds; reconsulted for buttocks wound Reason for Consult: Pressure Injury R buttocks  Wound type: unstageable Pressure Injury R buttock  Pressure Injury POA: Yes Measurement: see nursing flowsheet Wound bed: 100% yellow necrotic  Drainage (amount, consistency, odor)  see nursing flowsheet  Periwound: dark discoloration noted to sacrum  Dressing procedure/placement/frequency: Cleanse R buttock wound with Vashe wound cleanser, apply Medihoney to wound bed daily, cover with dry gauze and silicone foam.   Apply a thin layer of Desitin to sacrum and buttocks 2 times daily and prn soiling.   POC discussed with bedside nurse. WOC team will not follow.  Patient orthopedics and vascular consulted for B lower legs.   Thank you,    Priscella Mann MSN, RN-BC, [employer] [phone]

## 2023-05-10 NOTE — Inpatient Diabetes Management (Signed)
 Inpatient Diabetes Program Recommendations  AACE/ADA: New Consensus Statement on Inpatient Glycemic Control (2015)  Target Ranges:  Prepandial:   less than 140 mg/dL      Peak postprandial:   less than 180 mg/dL (1-2 hours)      Critically ill patients:  140 - 180 mg/dL   Lab Results  Component Value Date   GLUCAP 115 (H) 04/21/2023   HGBA1C 7.4 (H) 04/19/2023    Review of Glycemic Control  Latest Reference Range & Units 04/23/2023 05:58 04/30/2023 07:48  Glucose-Capillary 70 - 99 mg/dL 161 (H) 096 (H)  (H): Data is abnormally high Diabetes history: Type 2 DM Outpatient Diabetes medications: Metformin 1000 mg BID, Jardiance 25 mg QD Current orders for Inpatient glycemic control: Novolog 0-9  units Q4H  Inpatient Diabetes Program Recommendations:   Noted hypoglycemia on presentation.  Given clinical status, consider reducing Novolog 0-6 units Q4H.  Thanks, Lujean Rave, MSN, RNC-OB Diabetes Coordinator [phone] (8a-5p)

## 2023-05-10 NOTE — Consult Note (Addendum)
 [facility] NOTE   MRN : [id number]  Reason for Consult: B LE wounds Referring Physician: ED  History of Present Illness: 58 y/o female who has no family in the room and is a poor historian.  B leg/ foot wounds.  The skin has the appearence of long standing venous insufficiency.  Admitted with sepsis.   Past medical history:venous insufficiency with chronic LE wounds, HTN, T2DM, HLD      Current Facility-Administered Medications  Medication Dose Route Frequency Provider Last Rate Last Admin   acetaminophen (TYLENOL) tablet 650 mg  650 mg Oral Q6H PRN Shitarev, Dimitry, MD       Or   acetaminophen (TYLENOL) suppository 650 mg  650 mg Rectal Q6H PRN Shitarev, Dimitry, MD       Chlorhexidine Gluconate Cloth 2 % PADS 6 each  6 each Topical Daily Rozann Lesches, MD       docusate sodium (COLACE) capsule 100 mg  100 mg Oral BID PRN Rozann Lesches, MD       DULoxetine (CYMBALTA) DR capsule 60 mg  60 mg Oral Daily Shitarev, Dimitry, MD       heparin injection 5,000 Units  5,000 Units Subcutaneous Q8H Rozann Lesches, MD   5,000 Units at 04/13/2023 0548   insulin aspart (novoLOG) injection 0-9 Units  0-9 Units Subcutaneous Q4H Rozann Lesches, MD       metroNIDAZOLE (FLAGYL) IVPB 500 mg  500 mg Intravenous BID Rozann Lesches, MD   Stopped at 05/06/2023 0340   multivitamin with minerals tablet 1 tablet  1 tablet Oral Daily Francena Hanly, RPH       norepinephrine (LEVOPHED) 16 mg in (0.064 mg/mL) premix infusion  0-40 mcg/min Intravenous Titrated Rozann Lesches, MD       nystatin (MYCOSTATIN/NYSTOP) topical powder   Topical BID Shitarev, Dimitry, MD   Given at 04/19/23 2128   polyethylene glycol (MIRALAX / GLYCOLAX) packet 17 g  17 g Oral Daily PRN Rozann Lesches, MD       rosuvastatin (CRESTOR) tablet 40 mg  40 mg Oral Daily Shitarev, Dimitry, MD        Pt meds include: Statin :Yes Betablocker: No ASA: No Other anticoagulants/antiplatelets: none  Past Medical  History:  Diagnosis Date   Diabetes mellitus without complication (HCC)    DVT (deep venous thrombosis) (HCC)    Hyperlipidemia    Hypertension     History reviewed. No pertinent surgical history.  Social History Social History   Tobacco Use   Smoking status: Former    Current packs/day: 0.00    Types: Cigarettes    Quit date: 01/25/2012    Years since quitting: 11.2   Smokeless tobacco: Never  Vaping Use   Vaping status: Never Used  Substance Use Topics   Alcohol use: No   Drug use: No    Family History Family History  Problem Relation Age of Onset   Hypertension Mother    Hypertension Father    Hyperlipidemia Sister     Allergies  Allergen Reactions   Cortisone Swelling     REVIEW OF SYSTEMS  General: [ ]  Weight loss, [ ]  Fever, [ ]  chills Neurologic: [ ]  Dizziness, [ ]  Blackouts, [ ]  Seizure [ ]  Stroke, [ ]  "Mini stroke", [ ]  Slurred speech, [ ]  Temporary blindness; [ ]  weakness in arms or legs, [ ]  Hoarseness [ ]  Dysphagia Cardiac: [ ]  Chest pain/pressure, [ ]  Shortness of breath at rest [ ]   Shortness of breath with exertion, [ ]  Atrial fibrillation or irregular heartbeat  Vascular: [ ]  Pain in legs with walking, [ ]  Pain in legs at rest, [ ]  Pain in legs at night,  [ x] Non-healing ulcer, [ ]  Blood clot in vein/DVT,   Pulmonary: [ ]  Home oxygen, [ ]  Productive cough, [ ]  Coughing up blood, [ ]  Asthma,  [ ]  Wheezing [ ]  COPD Musculoskeletal:  [ ]  Arthritis, [ ]  Low back pain, [ ]  Joint pain Hematologic: [ ]  Easy Bruising, [ ]  Anemia; [ ]  Hepatitis Gastrointestinal: [ ]  Blood in stool, [ ]  Gastroesophageal Reflux/heartburn, Urinary: [ ]  chronic Kidney disease, [ ]  on HD - [ ]  MWF or [ ]  TTHS, [ ]  Burning with urination, [ ]  Difficulty urinating Skin: [ ]  Rashes, [ ]  Wounds Psychological: [ ]  Anxiety, [ ]  Depression  Physical Examination Vitals:   04/30/2023 0615 04/30/2023 0630 04/24/2023 0645 04/13/2023 0700  BP: 93/67 93/64 95/65  94/63  Pulse: (!) 106 (!)  103 (!) 105 (!) 110  Resp: 19 18 20  (!) 22  Temp:    (!) 97.3 F (36.3 C)  TempSrc:    Axillary  SpO2: 95% 94% 95% 95%  Weight:      Height:       Body mass index is 29.64 kg/m.  General:  WDWN in NAD Gait: Normal HENT: WNL Eyes: Pupils equal Pulmonary: normal non-labored breathing , without Rales, rhonchi,  wheezing Cardiac: RRR, without  Murmurs, rubs or gallops; No carotid bruits Abdomen: soft, NT, no masses Skin:          Vascular Exam/Pulses:B DP signals intact   Musculoskeletal: no muscle wasting or atrophy; minimal  edema  Non verbal except to pain response    Significant Diagnostic Studies: CBC Lab Results  Component Value Date   WBC 13.4 (H) 05/02/2023   HGB 14.3 05/01/2023   HCT 42.0 04/09/2023   MCV 95.2 04/09/2023   PLT 358 04/13/2023    BMET    Component Value Date/Time   NA 134 (L) 05/03/2023 0616   NA 139 11/19/2022 1209   K 5.1 04/21/2023 0616   CL 105 05/01/2023 0458   CO2 16 (L) 04/09/2023 0458   GLUCOSE 216 (H) 04/11/2023 0458   BUN 36 (H) 04/17/2023 0458   BUN 14 11/19/2022 1209   CREATININE 2.43 (H) 04/23/2023 0458   CREATININE 0.87 05/03/2013 0957   CALCIUM 8.1 (L) 04/13/2023 0458   GFRNONAA 23 (L) 05/09/2023 0458   GFRNONAA 73 02/20/2013 1453   GFRAA 82 03/18/2020 1040   GFRAA 84 02/20/2013 1453   Estimated Creatinine Clearance: 27.4 mL/min (A) (by C-G formula based on SCr of 2.43 mg/dL (H)).  COAG No results found for: "INR", "PROTIME"   Non-Invasive Vascular Imaging:    +---------+---------------+---------+-----------+----------+---------------  ----+  RIGHT   CompressibilityPhasicitySpontaneityPropertiesThrombus Aging        +---------+---------------+---------+-----------+----------+---------------  ----+  CFV     Full           No       Yes                                        +---------+---------------+---------+-----------+----------+---------------  ----+  SFJ     Full           No        No                                         +---------+---------------+---------+-----------+----------+---------------  ----+  FV Prox  Full                                                               +---------+---------------+---------+-----------+----------+---------------  ----+  FV Mid                  Yes      Yes                  No well  visualized,                                                       patent by  color and                                                        doppler.              +---------+---------------+---------+-----------+----------+---------------  ----+  FV DistalFull           Yes      Yes                                        +---------+---------------+---------+-----------+----------+---------------  ----+  PFV                                                  Not well  visualized  +---------+---------------+---------+-----------+----------+---------------  ----+  POP     Full           No       Yes                                        +---------+---------------+---------+-----------+----------+---------------  ----+  PTV     Full                                                               +---------+---------------+---------+-----------+----------+---------------  ----+  PERO    Full                                                               +---------+---------------+---------+-----------+----------+---------------  ----+         +---------+---------------+---------+-----------+----------+--------------+   LEFT    CompressibilityPhasicitySpontaneityPropertiesThrombus  Aging  +---------+---------------+---------+-----------+----------+--------------+  CFV     Full           Yes      Yes                                   +---------+---------------+---------+-----------+----------+--------------+   SFJ     Full           Yes       Yes                                   +---------+---------------+---------+-----------+----------+--------------+   FV Prox  Full                                                          +---------+---------------+---------+-----------+----------+--------------+   FV Mid   Full           Yes      Yes                                   +---------+---------------+---------+-----------+----------+--------------+   FV DistalFull           Yes      Yes                                   +---------+---------------+---------+-----------+----------+--------------+   PFV     Full                                                          +---------+---------------+---------+-----------+----------+--------------+   POP     Full           Yes      Yes                                   +---------+---------------+---------+-----------+----------+--------------+   PTV     Full                                                          +---------+---------------+---------+-----------+----------+--------------+   PERO    Full                                                          +---------+---------------+---------+-----------+----------+--------------+       Summary:  RIGHT:   - There is no evidence of deep vein thrombosis in the lower extremity.  However, portions of this examination were limited- see technologist  comments above.    - No cystic structure found  in the popliteal fossa.    LEFT:    - There is no evidence of deep vein thrombosis in the lower extremity.    - No cystic structure found in the popliteal fossa.     ASSESSMENT/PLAN:  Mixed venous and arterial disease.  The leg wounds have evidence of purulence drainage on the dressing right > left.  An ABI was ordered but I feel she will not tolerate this study.  Cr 2.43. Urine OP note currently documented.  She would benefit from CTA with contrast verse angiogram to  better observe her arterial flow.  The wounds may need to be debrided in the OR pending medical stability.    Leukocytosis 13.4 IV Zosyn and Vancomycin started.  BP support Levo and vasopressin.     Pending medical stabilization     Sarah Macdonald 04/25/2023 7:59 AM  I have independently interviewed and examined patient and agree with PA assessment and plan above.  She has mixed venous and arterial wounds of the bilateral lower extremities and has been evaluated by orthopedics without diagnosis of necrotizing fasciitis.  Unfortunately this is likely a limb threatening and possibly life-threatening process.  We can evaluate her arteries and consider angiography if her condition improves.  Venous evaluation currently scheduled for outpatient.  Sachin Ferencz C. Randie Heinz, MD [facility] Office: [phone] Pager: [phone]

## 2023-05-10 NOTE — ED Notes (Signed)
Unable to obtain pulse ox reading

## 2023-05-10 NOTE — Interval H&P Note (Signed)
 History and Physical Interval Note:  04/11/2023 11:38 AM  Sarah Macdonald  has presented today for surgery, with the diagnosis of heart failure.  The various methods of treatment have been discussed with the patient and family. After consideration of risks, benefits and other options for treatment, the patient has consented to  Procedure(s): RIGHT HEART CATH (N/A) as a surgical intervention.  The patient's history has been reviewed, patient examined, no change in status, stable for surgery.  I have reviewed the patient's chart and labs.  Questions were answered to the patient's satisfaction.     Belladonna Lubinski [employer]

## 2023-05-10 NOTE — Progress Notes (Signed)
 PT Cancellation Note  Patient Details Name: Sarah Macdonald MRN: [id number] DOB: Jul 18, 1965   Cancelled Treatment:    Reason Eval/Treat Not Completed: Patient at procedure or test/unavailable. Pt off the unit in cath lab. PT to return as able to complete PT eval.  Lewis Shock, PT, DPT [facility] Services Secure chat preferred Office #: [phone]    [name] 05/01/2023, 11:21 AM

## 2023-05-10 NOTE — Progress Notes (Signed)
 05/01/2023 Patient DNR Family on way to hospital Treat any symptoms of distress Patient starting to brady down Told them death is imminent

## 2023-05-10 NOTE — Procedures (Signed)
 Central Venous Catheter Insertion Procedure Note  Sarah Macdonald  [id number]  [birth year]  Date:04/21/2023  Time:7:07 AM   Provider Performing:Orren Pietsch Salena Saner Katrinka Blazing     Procedure: Insertion of Non-tunneled Central Venous [phone]) with US guidance ([id number])   Indication(s) Medication administration  Consent Risks of the procedure as well as the alternatives and risks of each were explained to the patient and/or caregiver.  Consent for the procedure was obtained and is signed in the bedside chart Discussed with patient's Brother -Siri Cole (Emergency Contact) and consent was given via phone by Brother.   Anesthesia Topical only with 1% lidocaine   Timeout Verified patient identification, verified procedure, site/side was marked, verified correct patient position, special equipment/implants available, medications/allergies/relevant history reviewed, required imaging and test results available.  Sterile Technique Maximal sterile technique including full sterile barrier drape, hand hygiene, sterile gown, sterile gloves, mask, hair covering, sterile ultrasound probe cover (if used).  Procedure Description Area of catheter insertion was cleaned with chlorhexidine and draped in sterile fashion.  With real-time ultrasound guidance a central venous catheter was placed into the right internal jugular vein. Nonpulsatile blood flow and easy flushing noted in all ports.  The catheter was sutured in place and sterile dressing applied.  Complications/Tolerance None; patient tolerated the procedure well. Chest X-ray is ordered to verify placement for internal jugular or subclavian cannulation.   Chest x-ray is not ordered for femoral cannulation.  EBL Minimal  Specimen(s) None   Hazel Sams AGACNP-BC   [facility] 04/27/2023, 7:09 AM  Please see [email or web address] for pager details.  From 7A-7P if no response, please call [phone]. After hours, please call ELink  [phone].

## 2023-05-10 NOTE — Consult Note (Signed)
 Reason for Consult:BLE cellulitis Referring Physician: Levon Hedger Time called: [id number] Time at bedside: 0913   Sarah Macdonald is an 58 y.o. female.  HPI: Sarah Macdonald was admitted yesterday with sepsis. She had been having increased pain in her legs and increased discharge from a long-standing venous stasis ulcer on her left calf. Orthopedic surgery was consulted to r/o necrotizing fasciitis. She is somnolent this am and cannot contribute much to history.  Past Medical History:  Diagnosis Date   Diabetes mellitus without complication (HCC)    DVT (deep venous thrombosis) (HCC)    Hyperlipidemia    Hypertension     History reviewed. No pertinent surgical history.  Family History  Problem Relation Age of Onset   Hypertension Mother    Hypertension Father    Hyperlipidemia Sister     Social History:  reports that she quit smoking about 11 years ago. Her smoking use included cigarettes. She has never used smokeless tobacco. She reports that she does not drink alcohol and does not use drugs.  Allergies:  Allergies  Allergen Reactions   Cortisone Swelling    Medications: I have reviewed the patient's current medications.  Results for orders placed or performed during the hospital encounter of 04/19/23 (from the past 48 hours)  Brain natriuretic peptide     Status: Abnormal   Collection Time: 04/19/23  4:03 PM  Result Value Ref Range   B Natriuretic Peptide 1,037.9 (H) 0.0 - 100.0 pg/mL    Comment: Performed at [facility], [address], [place], [place] [address]  CBC with Differential     Status: Abnormal   Collection Time: 04/19/23  4:18 PM  Result Value Ref Range   WBC 12.5 (H) 4.0 - 10.5 K/uL   RBC 4.78 3.87 - 5.11 MIL/uL   Hemoglobin 15.2 (H) 12.0 - 15.0 g/dL   HCT 78.2 95.6 - 21.3 %   MCV 95.0 80.0 - 100.0 fL   MCH 31.8 26.0 - 34.0 pg   MCHC 33.5 30.0 - 36.0 g/dL   RDW 08.6 (H) 57.8 - 46.9 %   Platelets 392 150 - 400 K/uL   nRBC 0.0 0.0 - 0.2 %    Neutrophils Relative % 50 %   Neutro Abs 11.4 (H) 1.7 - 7.7 K/uL   Band Neutrophils 41 %   Lymphocytes Relative 6 %   Lymphs Abs 0.8 0.7 - 4.0 K/uL   Monocytes Relative 2 %   Monocytes Absolute 0.3 0.1 - 1.0 K/uL   Eosinophils Relative 0 %   Eosinophils Absolute 0.0 0.0 - 0.5 K/uL   Basophils Relative 0 %   Basophils Absolute 0.0 0.0 - 0.1 K/uL   WBC Morphology INCREASED BANDS (>20% BANDS)    RBC Morphology MORPHOLOGY UNREMARKABLE    Smear Review Normal platelet morphology    Myelocytes 1 %   Abs Immature Granulocytes 0.10 (H) 0.00 - 0.07 K/uL    Comment: Performed at [facility], [address] [place], [place] [address]  Comprehensive metabolic panel     Status: Abnormal   Collection Time: 04/19/23  4:18 PM  Result Value Ref Range   Sodium 130 (L) 135 - 145 mmol/L   Potassium 5.1 3.5 - 5.1 mmol/L   Chloride 100 98 - 111 mmol/L   CO2 17 (L) 22 - 32 mmol/L   Glucose, Bld 70 70 - 99 mg/dL    Comment: Glucose reference range applies only to samples taken after fasting for at least 8 hours.  BUN 37 (H) 6 - 20 mg/dL   Creatinine, Ser 5.62 (H) 0.44 - 1.00 mg/dL   Calcium 8.7 (L) 8.9 - 10.3 mg/dL   Total Protein 7.0 6.5 - 8.1 g/dL   Albumin 2.6 (L) 3.5 - 5.0 g/dL   AST 99 (H) 15 - 41 U/L   ALT 45 (H) 0 - 44 U/L   Alkaline Phosphatase 247 (H) 38 - 126 U/L   Total Bilirubin 2.6 (H) 0.0 - 1.2 mg/dL   GFR, Estimated 25 (L) >60 mL/min    Comment: (NOTE) Calculated using the CKD-EPI Creatinine Equation (2021)    Anion gap 13 5 - 15    Comment: Performed at [facility], [address] [place], [place] [address]  Magnesium     Status: None   Collection Time: 04/19/23  4:18 PM  Result Value Ref Range   Magnesium 1.7 1.7 - 2.4 mg/dL    Comment: Performed at [facility], [address] [place], [place] [address]  Troponin I (High Sensitivity)     Status: Abnormal   Collection Time: 04/19/23  4:18 PM  Result Value Ref Range   Troponin I (High Sensitivity) 52  (H) <18 ng/L    Comment: (NOTE) Elevated high sensitivity troponin I (hsTnI) values and significant  changes across serial measurements may suggest ACS but many other  chronic and acute conditions are known to elevate hsTnI results.  Refer to the "Links" section for chest pain algorithms and additional  guidance. Performed at [facility], [address], [place], [place] [address]   POC CBG, ED     Status: Abnormal   Collection Time: 04/19/23  4:22 PM  Result Value Ref Range   Glucose-Capillary <10 (LL) 70 - 99 mg/dL    Comment: Glucose reference range applies only to samples taken after fasting for at least 8 hours.  CBG monitoring, ED     Status: None   Collection Time: 04/19/23  4:28 PM  Result Value Ref Range   Glucose-Capillary 79 70 - 99 mg/dL    Comment: Glucose reference range applies only to samples taken after fasting for at least 8 hours.  Resp panel by RT-PCR (RSV, Flu A&B, Covid) Anterior Nasal Swab     Status: None   Collection Time: 04/19/23  4:32 PM   Specimen: Anterior Nasal Swab  Result Value Ref Range   SARS Coronavirus 2 by RT PCR NEGATIVE NEGATIVE   Influenza A by PCR NEGATIVE NEGATIVE   Influenza B by PCR NEGATIVE NEGATIVE    Comment: (NOTE) The Xpert Xpress SARS-CoV-2/FLU/RSV plus assay is intended as an aid in the diagnosis of influenza from Nasopharyngeal swab specimens and should not be used as a sole basis for treatment. Nasal washings and aspirates are unacceptable for Xpert Xpress SARS-CoV-2/FLU/RSV testing.  Fact Sheet for Patients: BloggerCourse.com  Fact Sheet for Healthcare Providers: SeriousBroker.it  This test is not yet approved or cleared by the [place] FDA and has been authorized for detection and/or diagnosis of SARS-CoV-2 by FDA under an Emergency Use Authorization (EUA). This EUA will remain in effect (meaning this test can be used) for the duration of the COVID-19  declaration under Section 564(b)(1) of the Act, 21 U.S.C. section 360bbb-3(b)(1), unless the authorization is terminated or revoked.     Resp Syncytial Virus by PCR NEGATIVE NEGATIVE    Comment: (NOTE) Fact Sheet for Patients: BloggerCourse.com  Fact Sheet for Healthcare Providers: SeriousBroker.it  This test is not yet  approved or cleared by the [place] and has been authorized for detection and/or diagnosis of SARS-CoV-2 by FDA under an Emergency Use Authorization (EUA). This EUA will remain in effect (meaning this test can be used) for the duration of the COVID-19 declaration under Section 564(b)(1) of the Act, 21 U.S.C. section 360bbb-3(b)(1), unless the authorization is terminated or revoked.  Performed at [facility], [address] [place], [place] [address]   Troponin I (High Sensitivity)     Status: Abnormal   Collection Time: 04/19/23  6:29 PM  Result Value Ref Range   Troponin I (High Sensitivity) 49 (H) <18 ng/L    Comment: (NOTE) Elevated high sensitivity troponin I (hsTnI) values and significant  changes across serial measurements may suggest ACS but many other  chronic and acute conditions are known to elevate hsTnI results.  Refer to the "Links" section for chest pain algorithms and additional  guidance. Performed at [facility], [address] [place], [place] [address]   Blood culture (routine x 2)     Status: None (Preliminary result)   Collection Time: 04/19/23  6:29 PM   Specimen: BLOOD  Result Value Ref Range   Specimen Description BLOOD RIGHT ANTECUBITAL    Special Requests      BOTTLES DRAWN AEROBIC AND ANAEROBIC Blood Culture results may not be optimal due to an inadequate volume of blood received in culture bottles   Culture      NO GROWTH < 12 HOURS Performed at [facility], [address], [place], [place] [address]    Report Status PENDING   Blood culture  (routine x 2)     Status: None (Preliminary result)   Collection Time: 04/19/23  6:48 PM   Specimen: BLOOD RIGHT ARM  Result Value Ref Range   Specimen Description BLOOD RIGHT ARM    Special Requests      BOTTLES DRAWN AEROBIC AND ANAEROBIC Blood Culture results may not be optimal due to an inadequate volume of blood received in culture bottles   Culture      NO GROWTH < 12 HOURS Performed at [facility], [address] [place], [place] [address]    Report Status PENDING   I-Stat CG4 Lactic Acid     Status: Abnormal   Collection Time: 04/19/23  6:57 PM  Result Value Ref Range   Lactic Acid, Venous 2.3 (HH) 0.5 - 1.9 mmol/L   Comment NOTIFIED PHYSICIAN   CBG monitoring, ED     Status: Abnormal   Collection Time: 04/19/23  8:00 PM  Result Value Ref Range   Glucose-Capillary 39 (LL) 70 - 99 mg/dL    Comment: Glucose reference range applies only to samples taken after fasting for at least 8 hours.   Comment 1 Notify RN   HIV Antibody (routine testing w rflx)     Status: None   Collection Time: 04/19/23  8:04 PM  Result Value Ref Range   HIV Screen 4th Generation wRfx Non Reactive Non Reactive    Comment: Performed at [facility], [address] [place], [place] [address]  TSH     Status: Abnormal   Collection Time: 04/19/23  8:04 PM  Result Value Ref Range   TSH 5.684 (H) 0.350 - 4.500 uIU/mL    Comment: Performed by a 3rd Generation assay with a functional sensitivity of <=0.01 uIU/mL. Performed at [facility], [address] [place], [place] [address]  Hemoglobin A1c     Status: Abnormal   Collection Time: 04/19/23  8:04 PM  Result Value Ref Range   Hgb A1c MFr Bld 7.4 (H) 4.8 - 5.6 %    Comment: (NOTE) Pre diabetes:          5.7%-6.4%  Diabetes:              >6.4%  Glycemic control for   <7.0% adults with diabetes    Mean Plasma Glucose 165.68 mg/dL    Comment: Performed at [facility], [address], [place], [place] [address]  CBG  monitoring, ED     Status: None   Collection Time: 04/19/23  8:31 PM  Result Value Ref Range   Glucose-Capillary 74 70 - 99 mg/dL    Comment: Glucose reference range applies only to samples taken after fasting for at least 8 hours.  CBG monitoring, ED     Status: Abnormal   Collection Time: 04/19/23  9:03 PM  Result Value Ref Range   Glucose-Capillary 37 (LL) 70 - 99 mg/dL    Comment: Glucose reference range applies only to samples taken after fasting for at least 8 hours.  CBG monitoring, ED     Status: Abnormal   Collection Time: 04/19/23  9:37 PM  Result Value Ref Range   Glucose-Capillary 174 (H) 70 - 99 mg/dL    Comment: Glucose reference range applies only to samples taken after fasting for at least 8 hours.  CBG monitoring, ED     Status: Abnormal   Collection Time: 04/19/23 10:45 PM  Result Value Ref Range   Glucose-Capillary 147 (H) 70 - 99 mg/dL    Comment: Glucose reference range applies only to samples taken after fasting for at least 8 hours.  I-Stat CG4 Lactic Acid     Status: Abnormal   Collection Time: 04/19/23 10:52 PM  Result Value Ref Range   Lactic Acid, Venous 4.5 (HH) 0.5 - 1.9 mmol/L   Comment NOTIFIED PHYSICIAN   CBG monitoring, ED     Status: None   Collection Time: 04/30/2023 12:07 AM  Result Value Ref Range   Glucose-Capillary 90 70 - 99 mg/dL    Comment: Glucose reference range applies only to samples taken after fasting for at least 8 hours.   Comment 1 Notify RN    Comment 2 Document in Chart   Lactic acid, plasma     Status: Abnormal   Collection Time: 04/12/2023  1:00 AM  Result Value Ref Range   Lactic Acid, Venous 4.9 (HH) 0.5 - 1.9 mmol/L    Comment: CRITICAL RESULT CALLED TO, READ BACK BY AND VERIFIED WITH S.SIMS RN 0150 04/28/2023 BY G.GANADEN Performed at [facility], [address], [place], [place] [address]   CBG monitoring, ED     Status: Abnormal   Collection Time: 04/30/2023  1:04 AM  Result Value Ref Range   Glucose-Capillary 131  (H) 70 - 99 mg/dL    Comment: Glucose reference range applies only to samples taken after fasting for at least 8 hours.   Comment 1 Notify RN    Comment 2 Document in Chart   CBG monitoring, ED     Status: None   Collection Time: 04/11/2023  1:59 AM  Result Value Ref Range   Glucose-Capillary 82 70 - 99 mg/dL    Comment: Glucose reference range applies only to samples taken after fasting for at least 8 hours.  CBG monitoring, ED  Status: None   Collection Time: 04/28/2023  3:14 AM  Result Value Ref Range   Glucose-Capillary 92 70 - 99 mg/dL    Comment: Glucose reference range applies only to samples taken after fasting for at least 8 hours.  CBC     Status: Abnormal   Collection Time: 05/06/2023  4:29 AM  Result Value Ref Range   WBC 13.4 (H) 4.0 - 10.5 K/uL   RBC 3.72 (L) 3.87 - 5.11 MIL/uL   Hemoglobin 12.1 12.0 - 15.0 g/dL   HCT 91.4 (L) 78.2 - 95.6 %   MCV 95.2 80.0 - 100.0 fL   MCH 32.5 26.0 - 34.0 pg   MCHC 34.2 30.0 - 36.0 g/dL   RDW 21.3 (H) 08.6 - 57.8 %   Platelets 358 150 - 400 K/uL   nRBC 0.0 0.0 - 0.2 %    Comment: Performed at [facility], [address], [place], [place] [address]  Lactic acid, plasma     Status: Abnormal   Collection Time: 04/23/2023  4:30 AM  Result Value Ref Range   Lactic Acid, Venous 3.5 (HH) 0.5 - 1.9 mmol/L    Comment: CRITICAL VALUE NOTED. VALUE IS CONSISTENT WITH PREVIOUSLY REPORTED/CALLED VALUE Performed at [facility], [address] [place], [place] [address]   Comprehensive metabolic panel     Status: Abnormal   Collection Time: 04/18/2023  4:58 AM  Result Value Ref Range   Sodium 130 (L) 135 - 145 mmol/L   Potassium 4.8 3.5 - 5.1 mmol/L   Chloride 105 98 - 111 mmol/L   CO2 16 (L) 22 - 32 mmol/L   Glucose, Bld 216 (H) 70 - 99 mg/dL    Comment: Glucose reference range applies only to samples taken after fasting for at least 8 hours.   BUN 36 (H) 6 - 20 mg/dL   Creatinine, Ser 1.32 (H) 0.44 - 1.00 mg/dL   Calcium 8.1 (L)  8.9 - 10.3 mg/dL   Total Protein 6.2 (L) 6.5 - 8.1 g/dL   Albumin 2.3 (L) 3.5 - 5.0 g/dL   AST 440 (H) 15 - 41 U/L   ALT 75 (H) 0 - 44 U/L   Alkaline Phosphatase 231 (H) 38 - 126 U/L   Total Bilirubin 2.6 (H) 0.0 - 1.2 mg/dL   GFR, Estimated 23 (L) >60 mL/min    Comment: (NOTE) Calculated using the CKD-EPI Creatinine Equation (2021)    Anion gap 9 5 - 15    Comment: Performed at [facility], [address], [place], [place] [address]  Magnesium     Status: Abnormal   Collection Time: 05/02/2023  4:58 AM  Result Value Ref Range   Magnesium 1.6 (L) 1.7 - 2.4 mg/dL    Comment: Performed at [facility], [address] [place], [place] [address]  Phosphorus     Status: None   Collection Time: 04/23/2023  4:58 AM  Result Value Ref Range   Phosphorus 4.1 2.5 - 4.6 mg/dL    Comment: Performed at [facility], [address] [place], [place] [address]  MRSA Next Gen by PCR, Nasal     Status: None   Collection Time: 04/24/2023  5:41 AM   Specimen: Nasal Mucosa; Nasal Swab  Result Value Ref Range   MRSA by PCR Next Gen NOT DETECTED NOT DETECTED    Comment: (NOTE) The GeneXpert MRSA Assay (FDA approved for NASAL specimens only), is one component of  a comprehensive MRSA colonization surveillance program. It is not intended to diagnose MRSA infection nor to guide or monitor treatment for MRSA infections. Test performance is not FDA approved in patients less than 29 years old. Performed at [facility], [address], [place], [place] [address]   Glucose, capillary     Status: Abnormal   Collection Time: 04/22/2023  5:58 AM  Result Value Ref Range   Glucose-Capillary 357 (H) 70 - 99 mg/dL    Comment: Glucose reference range applies only to samples taken after fasting for at least 8 hours.  I-STAT 7, (LYTES, BLD GAS, ICA, H+H)     Status: Abnormal   Collection Time: 04/28/2023  6:16 AM  Result Value Ref Range   pH, Arterial 7.322 (L) 7.35 - 7.45   pCO2 arterial 27.4  (L) 32 - 48 mmHg   pO2, Arterial 72 (L) 83 - 108 mmHg   Bicarbonate 14.2 (L) 20.0 - 28.0 mmol/L   TCO2 15 (L) 22 - 32 mmol/L   O2 Saturation 93 %   Acid-base deficit 10.0 (H) 0.0 - 2.0 mmol/L   Sodium 134 (L) 135 - 145 mmol/L   Potassium 5.1 3.5 - 5.1 mmol/L   Calcium, Ion 1.11 (L) 1.15 - 1.40 mmol/L   HCT 42.0 36.0 - 46.0 %   Hemoglobin 14.3 12.0 - 15.0 g/dL   Patient temperature 60.4 F    Sample type ARTERIAL   Glucose, capillary     Status: Abnormal   Collection Time: 04/15/2023  7:48 AM  Result Value Ref Range   Glucose-Capillary 115 (H) 70 - 99 mg/dL    Comment: Glucose reference range applies only to samples taken after fasting for at least 8 hours.    ECHOCARDIOGRAM COMPLETE Result Date: 04/16/2023    ECHOCARDIOGRAM REPORT   Patient Name:   Sarah Macdonald Date of Exam: 05/05/2023 Medical Rec #:  [id number]           Height:       66.0 in Accession #:    [id number]          Weight:       183.6 lb Date of Birth:  03/24/1965           BSA:          1.929 m Patient Age:    57 years            BP:           97/63 mmHg Patient Gender: F                   HR:           106 bpm. Exam Location:  Inpatient Procedure: 2D Echo, Color Doppler and Cardiac Doppler (Both Spectral and Color            Flow Doppler were utilized during procedure). Indications:    Murmur R01.1  History:        Patient has no prior history of Echocardiogram examinations.                 Risk Factors:Dyslipidemia and Hypertension.  Sonographer:    Harriette Bouillon RDCS Referring Phys: [id number] Darl Householder RUMBALL IMPRESSIONS  1. Left ventricular ejection fraction, by estimation, is 60 to 65%. The left ventricle has moderately decreased function. The left ventricle has no regional wall motion abnormalities. Left ventricular diastolic parameters are consistent with Grade I diastolic dysfunction (impaired relaxation).  2. D-shaped septum suggestive  of RV pressure/volume overload. Right ventricular systolic function is moderately  reduced. The right ventricular size is severely enlarged. There is severely elevated pulmonary artery systolic pressure. The estimated right ventricular systolic pressure is 90.3 mmHg.  3. Right atrial size was severely dilated.  4. A small pericardial effusion is present. The pericardial effusion is circumferential.  5. The mitral valve is normal in structure. Trivial mitral valve regurgitation. No evidence of mitral stenosis.  6. The tricuspid valve is abnormal. Tricuspid valve regurgitation is moderate.  7. The aortic valve is tricuspid. Aortic valve regurgitation is not visualized. No aortic stenosis is present.  8. The inferior vena cava is dilated in size with <50% respiratory variability, suggesting right atrial pressure of 15 mmHg. FINDINGS  Left Ventricle: Left ventricular ejection fraction, by estimation, is 60 to 65%. The left ventricle has moderately decreased function. The left ventricle has no regional wall motion abnormalities. The left ventricular internal cavity size was normal in size. There is no left ventricular hypertrophy. Left ventricular diastolic parameters are consistent with Grade I diastolic dysfunction (impaired relaxation). Right Ventricle: D-shaped septum suggestive of RV pressure/volume overload. The right ventricular size is severely enlarged. No increase in right ventricular wall thickness. Right ventricular systolic function is moderately reduced. There is severely elevated pulmonary artery systolic pressure. The tricuspid regurgitant velocity is 4.34 m/s, and with an assumed right atrial pressure of 15 mmHg, the estimated right ventricular systolic pressure is 90.3 mmHg. Left Atrium: Left atrial size was normal in size. Right Atrium: Right atrial size was severely dilated. Pericardium: A small pericardial effusion is present. The pericardial effusion is circumferential. Mitral Valve: The mitral valve is normal in structure. Trivial mitral valve regurgitation. No evidence of mitral  valve stenosis. Tricuspid Valve: The tricuspid valve is abnormal. Tricuspid valve regurgitation is moderate. Aortic Valve: The aortic valve is tricuspid. Aortic valve regurgitation is not visualized. No aortic stenosis is present. Pulmonic Valve: The pulmonic valve was normal in structure. Pulmonic valve regurgitation is trivial. Aorta: The aortic root is normal in size and structure. Venous: The inferior vena cava is dilated in size with less than 50% respiratory variability, suggesting right atrial pressure of 15 mmHg. IAS/Shunts: No atrial level shunt detected by color flow Doppler.  LEFT VENTRICLE PLAX 2D LVIDd:         3.60 cm   Diastology LVIDs:         1.70 cm   LV e' medial:    5.11 cm/s LV PW:         1.10 cm   LV E/e' medial:  10.1 LV IVS:        1.00 cm   LV e' lateral:   10.40 cm/s LVOT diam:     1.80 cm   LV E/e' lateral: 4.9 LV SV:         25 LV SV Index:   13 LVOT Area:     2.54 cm  RIGHT VENTRICLE             IVC RV S prime:     11.60 cm/s  IVC diam: 2.80 cm TAPSE (M-mode): 1.2 cm LEFT ATRIUM           Index        RIGHT ATRIUM           Index LA diam:      3.10 cm 1.61 cm/m   RA Area:     44.60 cm LA Vol (A4C): 34.4 ml 17.84 ml/m  RA Volume:  201.00 ml 104.22 ml/m  AORTIC VALVE LVOT Vmax:   73.80 cm/s LVOT Vmean:  51.200 cm/s LVOT VTI:    0.100 m  AORTA Ao Root diam: 3.20 cm Ao Asc diam:  3.40 cm MITRAL VALVE               TRICUSPID VALVE MV Area (PHT): 4.17 cm    TR Peak grad:   75.3 mmHg MV Decel Time: 182 msec    TR Vmax:        434.00 cm/s MV E velocity: 51.40 cm/s MV A velocity: 60.00 cm/s  SHUNTS MV E/A ratio:  0.86        Systemic VTI:  0.10 m                            Systemic Diam: 1.80 cm Dalton McleanMD Electronically signed by Wilfred Lacy Signature Date/Time: 04/15/2023/9:16:52 AM    Final    CT FEMUR LEFT WO CONTRAST Result Date: 05/03/2023 CLINICAL DATA:  Necrotizing fasciitis suspected, lower leg, xray done EXAM: CT OF THE FEMURS BILATERALLY WITHOUT CONTRAST. CT OF THE  LOWER EXTREMITIES BILATERALLY WITHOUT CONTRAST CT OF THE FEET BILATERALLY WITHOUT CONTRAST TECHNIQUE: Multidetector CT imaging of the lower left extremity was performed according to the standard protocol. RADIATION DOSE REDUCTION: This exam was performed according to the departmental dose-optimization program which includes automated exposure control, adjustment of the mA and/or kV according to patient size and/or use of iterative reconstruction technique. COMPARISON:  None Available. FINDINGS: Bones/Joint/Cartilage Mild motion artifact limits evaluation of the distal right femur. Normal alignment of the hips bilaterally. No acute fracture or dislocation. Mild bilateral degenerative hip arthritis. No lytic or blastic bone lesion identified. No osseous erosions are identified. Normal alignment of the knees bilaterally. Mild to moderate bicompartmental degenerative arthritis involving the lateral medial compartments, most severe within the medial compartment bilateral no acute fracture or dislocation. Mixed lytic and sclerotic lesion demonstrating arc like calcifications within the proximal left femur most in keeping with an enchondroma. No osseous erosions. Normal alignment of the bones of the feet bilaterally. No acute fracture or dislocation. Joint spaces are preserved. No osseous erosions identified. Small bilateral superior and plantar calcaneal spurs. Ligaments Suboptimally assessed by CT. Muscles and Tendons Extensive intrafascial edema within the thighs bilaterally. The musculotendinous structures of the lower extremities bilaterally are otherwise unremarkable. Soft tissues Extensive subcutaneous edema within the lower extremities bilaterally more severe centrally, particularly within the hips and thighs. Moderate pedal edema bilaterally. No loculated subcutaneous fluid collections are identified. No subcutaneous gas identified. No retained radiopaque foreign body identified. IMPRESSION: 1. Extensive  subcutaneous edema within the lower extremities bilaterally more severe centrally, particularly within the hips and thighs. Moderate pedal edema bilaterally. No loculated subcutaneous fluid collections are identified. No subcutaneous gas identified. No retained radiopaque foreign body identified. 2. No acute fracture or dislocation. No osseous erosions identified. 3. 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 4. Small bilateral superior and plantar calcaneal spurs. Electronically Signed   By: Helyn Numbers M.D.   On: 04/30/2023 03:20   CT FEMUR RIGHT WO CONTRAST Result Date: 05/01/2023 CLINICAL DATA:  Necrotizing fasciitis suspected, lower leg, xray done EXAM: CT OF THE FEMURS BILATERALLY WITHOUT CONTRAST. CT OF THE LOWER EXTREMITIES BILATERALLY WITHOUT CONTRAST CT OF THE FEET BILATERALLY WITHOUT CONTRAST TECHNIQUE: Multidetector CT imaging of the lower left extremity was  performed according to the standard protocol. RADIATION DOSE REDUCTION: This exam was performed according to the departmental dose-optimization program which includes automated exposure control, adjustment of the mA and/or kV according to patient size and/or use of iterative reconstruction technique. COMPARISON:  None Available. FINDINGS: Bones/Joint/Cartilage Mild motion artifact limits evaluation of the distal right femur. Normal alignment of the hips bilaterally. No acute fracture or dislocation. Mild bilateral degenerative hip arthritis. No lytic or blastic bone lesion identified. No osseous erosions are identified. Normal alignment of the knees bilaterally. Mild to moderate bicompartmental degenerative arthritis involving the lateral medial compartments, most severe within the medial compartment bilateral no acute fracture or dislocation. Mixed lytic and sclerotic lesion demonstrating arc like  calcifications within the proximal left femur most in keeping with an enchondroma. No osseous erosions. Normal alignment of the bones of the feet bilaterally. No acute fracture or dislocation. Joint spaces are preserved. No osseous erosions identified. Small bilateral superior and plantar calcaneal spurs. Ligaments Suboptimally assessed by CT. Muscles and Tendons Extensive intrafascial edema within the thighs bilaterally. The musculotendinous structures of the lower extremities bilaterally are otherwise unremarkable. Soft tissues Extensive subcutaneous edema within the lower extremities bilaterally more severe centrally, particularly within the hips and thighs. Moderate pedal edema bilaterally. No loculated subcutaneous fluid collections are identified. No subcutaneous gas identified. No retained radiopaque foreign body identified. IMPRESSION: 1. Extensive subcutaneous edema within the lower extremities bilaterally more severe centrally, particularly within the hips and thighs. Moderate pedal edema bilaterally. No loculated subcutaneous fluid collections are identified. No subcutaneous gas identified. No retained radiopaque foreign body identified. 2. No acute fracture or dislocation. No osseous erosions identified. 3. 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 4. Small bilateral superior and plantar calcaneal spurs. Electronically Signed   By: Helyn Numbers M.D.   On: 05/02/2023 03:20   CT TIBIA FIBULA LEFT WO CONTRAST Result Date: 04/17/2023 CLINICAL DATA:  Necrotizing fasciitis suspected, lower leg, xray done EXAM: CT OF THE FEMURS BILATERALLY WITHOUT CONTRAST. CT OF THE LOWER EXTREMITIES BILATERALLY WITHOUT CONTRAST CT OF THE FEET BILATERALLY WITHOUT CONTRAST TECHNIQUE: Multidetector CT imaging of the lower left extremity was performed according to the standard protocol.  RADIATION DOSE REDUCTION: This exam was performed according to the departmental dose-optimization program which includes automated exposure control, adjustment of the mA and/or kV according to patient size and/or use of iterative reconstruction technique. COMPARISON:  None Available. FINDINGS: Bones/Joint/Cartilage Mild motion artifact limits evaluation of the distal right femur. Normal alignment of the hips bilaterally. No acute fracture or dislocation. Mild bilateral degenerative hip arthritis. No lytic or blastic bone lesion identified. No osseous erosions are identified. Normal alignment of the knees bilaterally. Mild to moderate bicompartmental degenerative arthritis involving the lateral medial compartments, most severe within the medial compartment bilateral no acute fracture or dislocation. Mixed lytic and sclerotic lesion demonstrating arc like calcifications within the proximal left femur most in keeping with an enchondroma. No osseous erosions. Normal alignment of the bones of the feet bilaterally. No acute fracture or dislocation. Joint spaces are preserved. No osseous erosions identified. Small bilateral superior and plantar calcaneal spurs. Ligaments Suboptimally assessed by CT. Muscles and Tendons Extensive intrafascial edema within the thighs bilaterally. The musculotendinous structures of the lower extremities bilaterally are otherwise unremarkable. Soft tissues Extensive subcutaneous edema within the lower extremities bilaterally more severe centrally, particularly within the hips and thighs. Moderate pedal edema bilaterally. No loculated subcutaneous fluid  collections are identified. No subcutaneous gas identified. No retained radiopaque foreign body identified. IMPRESSION: 1. Extensive subcutaneous edema within the lower extremities bilaterally more severe centrally, particularly within the hips and thighs. Moderate pedal edema bilaterally. No loculated subcutaneous fluid collections are  identified. No subcutaneous gas identified. No retained radiopaque foreign body identified. 2. No acute fracture or dislocation. No osseous erosions identified. 3. 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 4. Small bilateral superior and plantar calcaneal spurs. Electronically Signed   By: Helyn Numbers M.D.   On: 04/21/2023 03:20   CT TIBIA FIBULA RIGHT WO CONTRAST Result Date: 04/17/2023 CLINICAL DATA:  Necrotizing fasciitis suspected, lower leg, xray done EXAM: CT OF THE FEMURS BILATERALLY WITHOUT CONTRAST. CT OF THE LOWER EXTREMITIES BILATERALLY WITHOUT CONTRAST CT OF THE FEET BILATERALLY WITHOUT CONTRAST TECHNIQUE: Multidetector CT imaging of the lower left extremity was performed according to the standard protocol. RADIATION DOSE REDUCTION: This exam was performed according to the departmental dose-optimization program which includes automated exposure control, adjustment of the mA and/or kV according to patient size and/or use of iterative reconstruction technique. COMPARISON:  None Available. FINDINGS: Bones/Joint/Cartilage Mild motion artifact limits evaluation of the distal right femur. Normal alignment of the hips bilaterally. No acute fracture or dislocation. Mild bilateral degenerative hip arthritis. No lytic or blastic bone lesion identified. No osseous erosions are identified. Normal alignment of the knees bilaterally. Mild to moderate bicompartmental degenerative arthritis involving the lateral medial compartments, most severe within the medial compartment bilateral no acute fracture or dislocation. Mixed lytic and sclerotic lesion demonstrating arc like calcifications within the proximal left femur most in keeping with an enchondroma. No osseous erosions. Normal alignment of the bones of the feet bilaterally. No acute fracture or dislocation. Joint spaces are  preserved. No osseous erosions identified. Small bilateral superior and plantar calcaneal spurs. Ligaments Suboptimally assessed by CT. Muscles and Tendons Extensive intrafascial edema within the thighs bilaterally. The musculotendinous structures of the lower extremities bilaterally are otherwise unremarkable. Soft tissues Extensive subcutaneous edema within the lower extremities bilaterally more severe centrally, particularly within the hips and thighs. Moderate pedal edema bilaterally. No loculated subcutaneous fluid collections are identified. No subcutaneous gas identified. No retained radiopaque foreign body identified. IMPRESSION: 1. Extensive subcutaneous edema within the lower extremities bilaterally more severe centrally, particularly within the hips and thighs. Moderate pedal edema bilaterally. No loculated subcutaneous fluid collections are identified. No subcutaneous gas identified. No retained radiopaque foreign body identified. 2. No acute fracture or dislocation. No osseous erosions identified. 3. 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 4. Small bilateral superior and plantar calcaneal spurs. Electronically Signed   By: Helyn Numbers M.D.   On: 05/09/2023 03:20   CT FOOT LEFT WO CONTRAST Result Date: 04/17/2023 CLINICAL DATA:  Necrotizing fasciitis suspected, lower leg, xray done EXAM: CT OF THE FEMURS BILATERALLY WITHOUT CONTRAST. CT OF THE LOWER EXTREMITIES BILATERALLY WITHOUT CONTRAST CT OF THE FEET BILATERALLY WITHOUT CONTRAST TECHNIQUE: Multidetector CT imaging of the lower left extremity was performed according to the standard protocol. RADIATION DOSE REDUCTION: This exam was performed according to the departmental dose-optimization program which includes automated exposure control, adjustment of the mA and/or kV according to patient size and/or use of  iterative reconstruction technique. COMPARISON:  None Available. FINDINGS: Bones/Joint/Cartilage Mild motion artifact limits evaluation of the distal right femur.  Normal alignment of the hips bilaterally. No acute fracture or dislocation. Mild bilateral degenerative hip arthritis. No lytic or blastic bone lesion identified. No osseous erosions are identified. Normal alignment of the knees bilaterally. Mild to moderate bicompartmental degenerative arthritis involving the lateral medial compartments, most severe within the medial compartment bilateral no acute fracture or dislocation. Mixed lytic and sclerotic lesion demonstrating arc like calcifications within the proximal left femur most in keeping with an enchondroma. No osseous erosions. Normal alignment of the bones of the feet bilaterally. No acute fracture or dislocation. Joint spaces are preserved. No osseous erosions identified. Small bilateral superior and plantar calcaneal spurs. Ligaments Suboptimally assessed by CT. Muscles and Tendons Extensive intrafascial edema within the thighs bilaterally. The musculotendinous structures of the lower extremities bilaterally are otherwise unremarkable. Soft tissues Extensive subcutaneous edema within the lower extremities bilaterally more severe centrally, particularly within the hips and thighs. Moderate pedal edema bilaterally. No loculated subcutaneous fluid collections are identified. No subcutaneous gas identified. No retained radiopaque foreign body identified. IMPRESSION: 1. Extensive subcutaneous edema within the lower extremities bilaterally more severe centrally, particularly within the hips and thighs. Moderate pedal edema bilaterally. No loculated subcutaneous fluid collections are identified. No subcutaneous gas identified. No retained radiopaque foreign body identified. 2. No acute fracture or dislocation. No osseous erosions identified. 3. Mild bilateral degenerative hip arthritis. Mild to moderate  bicompartmental degenerative arthritis of the knees bilaterally, most severe within the medial compartment bilateral. 4. Small bilateral superior and plantar calcaneal spurs. Electronically Signed   By: Helyn Numbers M.D.   On: 04/29/2023 03:20   CT FOOT RIGHT WO CONTRAST Result Date: 04/27/2023 CLINICAL DATA:  Necrotizing fasciitis suspected, lower leg, xray done EXAM: CT OF THE FEMURS BILATERALLY WITHOUT CONTRAST. CT OF THE LOWER EXTREMITIES BILATERALLY WITHOUT CONTRAST CT OF THE FEET BILATERALLY WITHOUT CONTRAST TECHNIQUE: Multidetector CT imaging of the lower left extremity was performed according to the standard protocol. RADIATION DOSE REDUCTION: This exam was performed according to the departmental dose-optimization program which includes automated exposure control, adjustment of the mA and/or kV according to patient size and/or use of iterative reconstruction technique. COMPARISON:  None Available. FINDINGS: Bones/Joint/Cartilage Mild motion artifact limits evaluation of the distal right femur. Normal alignment of the hips bilaterally. No acute fracture or dislocation. Mild bilateral degenerative hip arthritis. No lytic or blastic bone lesion identified. No osseous erosions are identified. Normal alignment of the knees bilaterally. Mild to moderate bicompartmental degenerative arthritis involving the lateral medial compartments, most severe within the medial compartment bilateral no acute fracture or dislocation. Mixed lytic and sclerotic lesion demonstrating arc like calcifications within the proximal left femur most in keeping with an enchondroma. No osseous erosions. Normal alignment of the bones of the feet bilaterally. No acute fracture or dislocation. Joint spaces are preserved. No osseous erosions identified. Small bilateral superior and plantar calcaneal spurs. Ligaments Suboptimally assessed by CT. Muscles and Tendons Extensive intrafascial edema within the thighs bilaterally. The  musculotendinous structures of the lower extremities bilaterally are otherwise unremarkable. Soft tissues Extensive subcutaneous edema within the lower extremities bilaterally more severe centrally, particularly within the hips and thighs. Moderate pedal edema bilaterally. No loculated subcutaneous fluid collections are identified. No subcutaneous gas identified. No retained radiopaque foreign body identified. IMPRESSION: 1. Extensive subcutaneous edema within the lower extremities bilaterally more severe centrally, particularly within the hips and thighs. Moderate pedal edema bilaterally. No loculated subcutaneous fluid collections are identified. No subcutaneous gas identified. No retained radiopaque foreign body identified. 2. No acute fracture  or dislocation. No osseous erosions identified. 3. 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 4. Small bilateral superior and plantar calcaneal spurs. Electronically Signed   By: Helyn Numbers M.D.   On: 04/16/2023 03:20   CT ABDOMEN PELVIS WO CONTRAST Result Date: 04/25/2023 CLINICAL DATA:  Acute kidney failure, necrotizing fasciitis EXAM: CT ABDOMEN AND PELVIS WITHOUT CONTRAST TECHNIQUE: Multidetector CT imaging of the abdomen and pelvis was performed following the standard protocol without IV contrast. RADIATION DOSE REDUCTION: This exam was performed according to the departmental dose-optimization program which includes automated exposure control, adjustment of the mA and/or kV according to patient size and/or use of iterative reconstruction technique. COMPARISON:  None Available. FINDINGS: Lower chest: Small bilateral pleural effusions with associated bibasilar compressive atelectasis. Mild cardiomegaly. Extensive subcutaneous body wall edema within the thorax. Hepatobiliary: Mild hepatomegaly. No focal liver abnormality is seen.  No gallstones, gallbladder wall thickening, or biliary dilatation. Pancreas: Unremarkable Spleen: Unremarkable Adrenals/Urinary Tract: Adrenal glands are unremarkable. Kidneys are normal, without renal calculi, focal lesion, or hydronephrosis. Bladder is unremarkable. Stomach/Bowel: Stomach, small bowel, and large bowel are unremarkable. The appendix is not identified and is likely absent. Mild ascites. No free intraperitoneal gas. Vascular/Lymphatic: Aortic atherosclerosis. Pathologically enlarged, enhancing bilateral inguinal lymph nodes present, possibly reactive, inflammatory, or lymphoproliferative in nature. Reproductive: Uterus and bilateral adnexa are unremarkable. Other: Extensive diffuse subcutaneous body wall edema. Retroperitoneal edema. Extensive subcutaneous edema within the visualized lower extremities bilaterally. No subcutaneous gas identified. Musculoskeletal: No acute or significant osseous findings. IMPRESSION: 1. Extensive diffuse subcutaneous body wall edema within the thorax, abdomen, and pelvis. No subcutaneous gas identified. 2. Small bilateral pleural effusions, retroperitoneal edema, and mild ascites in keeping with changes of anasarca. 3. Mild cardiomegaly. 4. Mild hepatomegaly. 5. Pathologically enlarged, enhancing bilateral inguinal lymph nodes, possibly reactive, inflammatory, or lymphoproliferative in nature. Aortic Atherosclerosis (ICD10-I70.0). Electronically Signed   By: Helyn Numbers M.D.   On: 04/21/2023 03:05   DG Ankle Left Port Result Date: 04/10/2023 CLINICAL DATA:  Left ankle swelling, wound EXAM: PORTABLE LEFT ANKLE - 2 VIEW COMPARISON:  None Available. FINDINGS: Normal alignment. No acute fracture or dislocation. Ankle mortise is aligned. No osseous erosion. Small plantar calcaneal spur. Diffuse extensive soft tissue swelling of the visualized left ankle. Shallow wound noted within the subcutaneous soft tissues of the posterior left lower extremity at the level of the  distal diaphysis of the tibia IMPRESSION: 1. Diffuse extensive soft tissue swelling. Shallow wound within the subcutaneous soft tissues of the posterior left lower extremity at the level of the distal diaphysis of the tibia. No osseous erosion. Electronically Signed   By: Helyn Numbers M.D.   On: 05/07/2023 02:03   DG CHEST PORT 1 VIEW Result Date: 05/02/2023 CLINICAL DATA:  Tachypnea. EXAM: PORTABLE CHEST 1 VIEW COMPARISON:  03/31/2023 FINDINGS: The heart is enlarged. Stable mediastinal contours. Aortic atherosclerosis. Suspected small bilateral pleural effusions. Vascular congestion. No pneumothorax. No confluent airspace disease IMPRESSION: Cardiomegaly with vascular congestion and suspected small bilateral pleural effusions. Electronically Signed   By: Narda Rutherford M.D.   On: 04/15/2023 00:16    Review of Systems  Unable to perform ROS: Mental status change   Blood pressure 94/63, pulse (!) 110, temperature (!) 97.3 F (36.3 C), temperature source Axillary, resp. rate (!) 22, height 5\' 6"  (1.676 m), weight 83.3 kg, SpO2 95%. Physical Exam Constitutional:      General: She is not  in acute distress.    Appearance: She is well-developed. She is not diaphoretic.     Comments: Somnolent  HENT:     Head: Normocephalic and atraumatic.  Eyes:     General: No scleral icterus.       Right eye: No discharge.        Left eye: No discharge.     Conjunctiva/sclera: Conjunctivae normal.  Cardiovascular:     Rate and Rhythm: Normal rate and regular rhythm.  Pulmonary:     Effort: Pulmonary effort is normal. No respiratory distress.  Musculoskeletal:     Cervical back: Normal range of motion.     Comments: BLE No traumatic wounds, ecchymosis, or rash  Mod TTP bilateral distal feet, compressive dressings in place bilaterally  No knee effusion  Knee stable to varus/ valgus and anterior/posterior stress  Sens DPN, SPN, TN grossly intact  Motor EHL, ext, flex, evers 3/5 R, 2/5 L  Toes  perfused, 1+ NP edema  Skin:    General: Skin is warm and dry.  Psychiatric:        Mood and Affect: Mood normal.        Behavior: Behavior normal.     Assessment/Plan: BLE cellulitis -- Advanced imaging does not show any e/o necrotizing fasciitis nor any fluid collections amenable to drainage. Would continue to treat cellulitis with IV abx. Could consider vascular surgery consult and possibly plastic surgery consult. Orthopedics will sign off.    Freeman Caldron, PA-C Orthopedic Surgery [phone] 04/18/2023, 9:32 AM

## 2023-05-10 NOTE — Progress Notes (Signed)
 Orthopedic Tech Progress Note Patient Details:  [name] May 08, 1965 [id number]  Per RN order will be canceled   Patient ID: Sarah Macdonald, female   DOB: 02/27/1965, 58 y.o.   MRN: [id number]  Donald Pore 04/10/2023, 3:36 PM

## 2023-05-10 NOTE — Consult Note (Addendum)
 WOC Nurse Consult Note:  patient with longstanding history of venous insufficiency, has been followed by her primary MD office who were placing unna boots but patient became intolerant of these; patient had been referred to vascular surgeon and [facility] for appointments scheduled in March  Reason for Consult: B lower leg wounds  Wound type: 1.  full thickness r/t venous insufficiency  2.  Intertriginous dermatitis underneath pannus and in thigh/groin area ICD-10 CM Codes for Irritant Dermatitis   L30.4  - Erythema intertrigo. Also used for abrasion of the hand, chafing of the skin, dermatitis due to sweating and friction, friction dermatitis, friction eczema, and genital/thigh intertrigo.  Pressure Injury POA: NA  Measurement: see nursing flowsheet  Wound bed:1.  R posterior leg with large full thickness ulcer 50% pink moist 50% yellow; L leg with smaller scattered ulcerations that are 75% yellow 25% pink  2. Erythema and scattered partial thickness skin loss underneath pannus and inner thighs/groin; suspect yeast component  Drainage (amount, consistency, odor) appears to have heavy drainage on old dressings  Periwound: dark peeling hyperkeratotic skin  Dressing procedure/placement/frequency:  Cleanse B lower legs (intact skin and ulcerations) with Vashe wound cleanser [place] [phone]), apply Xeroform gauze [place] [phone]) to anterior and posterior aspects of B lower legs, cover with ABD pads and secure with Kerlix roll gauze beginning just above toes and ending right below knees.  Apply Ace bandage wrapped in same fashion as Kerlix for light compression.  Clean underneath pannus, groin, and inner thigh area  with Vashe wound cleanser [place] [phone]), do not rinse and allow to air dry. Apply Tommy Rainwater [employer] [place] # [phone]) underneath pannus Measure and cut length of InterDry to fit in skin folds that have skin breakdown Tuck InterDry fabric into skin folds in a single layer, allow for 2 inches of overhang  from skin edges to allow for wicking to occur May remove to bathe; dry area thoroughly and then tuck into affected areas again Do not apply any creams or ointments when using InterDry DO NOT THROW AWAY FOR 5 DAYS unless soiled with stool DO NOT [facility] product, this will inactivate the silver in the material  New sheet of Interdry should be applied after 5 days of use if patient continues to have skin breakdown    Sprinkle Nystatin powder underneath pannus/inner thigh/groin as ordered by MD.   POC discussed with primary nurse. WOC team will not follow. Re-consult if further needs arise.   Thank you,    Priscella Mann MSN, RN-BC, [employer] [phone]

## 2023-05-10 NOTE — Progress Notes (Signed)
 OT Cancellation Note  Patient Details Name: Sarah Macdonald MRN: [id number] DOB: 1965/12/17   Cancelled Treatment:    Reason Eval/Treat Not Completed: Fatigue/lethargy limiting ability to participate (Attempted to see pt for skilled OT eval. However, pt somnolent with obtunded level of altertness this am and not able to participate at this time. OT also discussed with RN. OT to reattempt to see pt at a later time as appropriate/available.)  [name] "Ronaldo Miyamoto" M., OTR/L, MA Acute Rehab [phone]  [name] 04/22/2023, 10:41 AM

## 2023-05-10 NOTE — Progress Notes (Signed)
*  PRELIMINARY RESULTS* Echocardiogram 2D Echocardiogram has been attempted, pt receiving wound care. Will be back when finished.  Leda Roys 04/18/2023, 8:07 AM

## 2023-05-10 NOTE — Progress Notes (Signed)
 PCCM Interval Progress Note:  57yo chronically ill female who was admitted to ICU early AM for FTT, septic and cardiogenic shock on vasopressors. Initially presented to ED with fatigue, BLE wounds, hypotension, hypoglycemia. She received judicious IVF resuscitation and was started on vasopressors. CCM was consulted for admission due to ongoing hypotension, increasing lactic acid.   Exam: Appears older than stated age, critically ill appearing, not in extremis  Mucous membranes dry, nasal cannula, poor dentition  Lungs diminished bilaterally, 10L HFNC  Cardiac s1s2, systolic murmur, no rub or gallop  Abdomen rounded, edematous, hypoactive bowel sounds  Extremities with BLE chronic venous statis skin changes, multiple wounds (see wound assessment) and weeping   Labs  Lactic 3.5 (from 4.9) Na 130, bicarb 16, bun 36, sCr 2.43 AST 238, ALT 75, Alk phos 231, T bili 2.6  Mag 1.6 pH 7.322, pCO2 27., pO2 74, bicarb 14.2  UA without uti  Coox 67  Ammonia 37   CXR without obvious pneumonia although there appears to be a retrocardiac opacity ?pna vs atelectasis  Echo with EF 60-65%, RV mod reduced and severely enlarged, severely elevated PA pressures, RA severely dilated  Assessment and Plan   Shock  Lactic acidosis  Cardiogenic and septic shock mixed. Obvious lower extremity wounds with a few that are open and appear at some point purulent. No evidence of gas and vascular and orthopedics have evaluated. Will not take to OR. Not concerned for nec fasc right now. She had echo overnight with massive RV beginning to creep on LVOF. BNP 1037, trop 49. Coox 67. CVP 15  - AHF eval and taking for Swan, RHC  - ?CTEPH? - inotropic support depend on RHC  - linezolid and zosyn for septic component  - con't levo and vaso for MAP > 65  - f/u cortisol, procal, free T4 (TSH elevated) - wound setting her  - culture data negative so far for other source (blood culture, ua, rvp)  - repeat DVT studies    Acute kidney injury on chronic kidney disease  NAGMA Likely prerenal azotemia vs septic ATN  - s/p 1 amp bicarb this AM - trend bmp, mag, phos - replete elytes - strict I&O - f/u urine studies - Avoid nephrotoxic agents, renally dose medications - ensure adequate renal perfusion   Hypoglycemia  Initially on D10 gtt which was stopped 3/12 AM  - monitor  History of DVT  - repeat DVT studies today   LE Wounds  - wound care consult  - pain control

## 2023-05-10 NOTE — Progress Notes (Signed)
 eLink Physician-Brief Progress Note Patient Name: Sarah Macdonald DOB: [birth year] MRN: [id number]   Date of Service    HPI/Events of Note  Patient admitted with suspected septic shock from infected lower extremity wounds with accompanying cellulitis, work up is in progress.  eICU Interventions  New Patient Evaluation. Ground crew requested to come and place central line for vasopressor administration access.        Thomasene Lot Markell Sciascia , 6:07 AM

## 2023-05-10 NOTE — Progress Notes (Signed)
  Echocardiogram 2D Echocardiogram has been performed.  Leda Roys RDCS 05/07/2023, 9:11 AM

## 2023-05-10 NOTE — H&P (View-Only) (Signed)
 eLink Physician-Brief Progress Note Patient Name: Sarah Macdonald DOB: [birth year] MRN: [id number]   Date of Service  04/15/2023  HPI/Events of Note  Patient admitted with suspected septic shock from infected lower extremity wounds with accompanying cellulitis, work up is in progress.  eICU Interventions  New Patient Evaluation. Ground crew requested to come and place central line for vasopressor administration access.        Thomasene Lot Markell Sciascia 05/07/2023, 6:07 AM

## 2023-05-10 NOTE — Progress Notes (Signed)
BLE venous duplex has been completed. 

## 2023-05-10 NOTE — Progress Notes (Signed)
 04/17/2023 Clinically continues to deteriorate despite inotropes and vasodilators.  Now with worsening delirium and hypoxemia. I have called POA and recommended DNR as well as anyone that wants to see her tonight should come soon. I do not think would survive induction for intubation nor would she survive CPR with current clinical picture.  Myrla Halsted MD PCCM

## 2023-05-10 NOTE — Progress Notes (Signed)
 Advanced Heart Failure Progress Update  RHC (on NE 16): RA mean 15 RV 83/16 PA 86/37, mean 52 PCWP mean 13 PA 57%  Cardiac Output (Thermo) 3.41  Cardiac Index (Thermo) 1.77  PVR 11 WU SVR 1525 PAPi 3.2   1. Severe pulmonary arterial hypertension.  2. Normal PCWP, elevated RA pressure.  3. Low cardiac output though PAPi is preserved.  4. PVR and SVR both high.    Plan: - leave-in swan. Q4 hemodynamics - start Milrinone 0.125 mcg/kg/min. Increase to 0.25 if tolerates. - start inhaled veletri - place a line - will need diuretic dosing once on milrinone  Sarah Bernerd Terhune, NP 1:07 PM  Advanced Heart Failure Team Pager [phone] (M-F; 7a - 5p)  Please contact [facility] for night-coverage after hours (4p -7a ) and weekends on [email or web address]

## 2023-05-10 NOTE — Progress Notes (Addendum)
 PHARMACY - ANTICOAGULATION CONSULT NOTE  Pharmacy Consult for heparin Indication: r/o DVT  Allergies  Allergen Reactions   Cortisone Swelling    Patient Measurements: Height: 5\' 6"  (167.6 cm) Weight: 83.3 kg (183 lb 10.3 oz) IBW/kg (Calculated) : 59.3 Heparin Dosing Weight: 77.7 kg   Vital Signs: Temp: 97.3 F (36.3 C) (03/12 0700) Temp Source: Axillary (03/12 0700) BP: 94/63 (03/12 0700) Pulse Rate: 110 (03/12 0700)  Labs: Recent Labs    04/19/23 1618 04/19/23 1829 04/10/2023 0429 05/09/2023 0458 04/27/2023 0616  HGB 15.2*  --  12.1  --  14.3  HCT 45.4  --  35.4*  --  42.0  PLT 392  --  358  --   --   CREATININE 2.21*  --   --  2.43*  --   TROPONINIHS 52* 49*  --   --   --     Estimated Creatinine Clearance: 27.4 mL/min (A) (by C-G formula based on SCr of 2.43 mg/dL (H)).   Medical History: Past Medical History:  Diagnosis Date   Diabetes mellitus without complication (HCC)    DVT (deep venous thrombosis) (HCC)    Hyperlipidemia    Hypertension     Medications:  Scheduled:   Chlorhexidine Gluconate Cloth  6 each Topical Daily   heparin  5,000 Units Subcutaneous Q8H   insulin aspart  0-9 Units Subcutaneous Q4H   multivitamin with minerals  1 tablet Oral Daily   nystatin   Topical BID   rosuvastatin  40 mg Oral Daily    Assessment: 57 yof has hx of DVT (2020) presenting with weakness and weeping wounds/ulceration of LE. No AC PTA. ECHO showing D-shaped septum with moderately reduced RV dysfunction.   Hgb 14s, plt WNL. Was on subQ heparin for DVT ppx (last dose on 3/12). Plan for venous duplex to rule out VTE. Heparin start was delayed since going for cardiac cath which found severe pulmonary arterial hypertension in setting of low cardiac output.   Goal of Therapy:  Heparin level 0.3-0.7 units/ml Monitor platelets by anticoagulation protocol: Yes   Plan:  Hold on bolus with recent cath Start heparin infusion at 1250 units/hr  Order heparin level in 8  hours Monitor daily HL, CBC, and for s/sx of bleeding   Thank you for allowing pharmacy to participate in this patient's care,  Sherron Monday, PharmD, BCCCP Clinical Pharmacist  Phone: [phone] 05/04/2023 10:18 AM  Please check AMION for all [facility] Pharmacy phone numbers After 10:00 PM, call Main Pharmacy [phone]

## 2023-05-10 NOTE — Procedures (Signed)
 Arterial Catheter Insertion Procedure Note  Sarah Macdonald  [id number]  February 23, 1965  Date:04/26/2023  Time:2:21 PM    Provider Performing: Cristopher Peru    Procedure: Insertion of Arterial Line (13086) with US guidance (57846)   Indication(s) Blood pressure monitoring and/or need for frequent ABGs  Consent Risks of the procedure as well as the alternatives and risks of each were explained to the patient and/or caregiver.  Consent for the procedure was obtained and is signed in the bedside chart  Anesthesia None   Time Out Verified patient identification, verified procedure, site/side was marked, verified correct patient position, special equipment/implants available, medications/allergies/relevant history reviewed, required imaging and test results available.   Sterile Technique Maximal sterile technique including full sterile barrier drape, hand hygiene, sterile gown, sterile gloves, mask, hair covering, sterile ultrasound probe cover (if used).   Procedure Description Area of catheter insertion was cleaned with chlorhexidine and draped in sterile fashion. With real-time ultrasound guidance an arterial catheter was placed into the left radial artery.  Appropriate arterial tracings confirmed on monitor.     Complications/Tolerance None; patient tolerated the procedure well.   EBL Minimal   Specimen(s) None   Cristopher Peru, PA-C [facility] 04/30/2023 2:22 PM  Please see [email or web address] for pager details.  From 7A-7P if no response, please call [phone] After hours, please call ELink [phone]

## 2023-05-10 NOTE — Consult Note (Signed)
 NAME:  INOLA LISLE, MRN:  [id number], DOB:  12-26-1965, LOS: 1 ADMISSION DATE:  04/19/2023, CONSULTATION DATE:  04/12/2023 REFERRING MD:  Nelia Shi, MD, CHIEF COMPLAINT:  LE pain  History of Present Illness:  58 y/o female with PMH for DVT (not on anticoagulation), HLD, HTN, DM, chronic LE edema/venous stasis who was BIB EMS with c/o weakness and b/l LE weeping wounds/ulcerations of LE and painful.  She had an up coming wound care appointment but has not seen them yet.  The patient also found to be hypoglycemic.  She was placed on D10 and then transitioned to D5LR at 75 cc/hr.  She has received 1 bolus IVF but because of increase BMP she was not given more IVFs, serun Cr also elevated from baseline.  The patient is on Metformin and Jardiance. She was seen in ED 2/20 with difficulty ambulating but she declined to stay for admissoin and she was d/c on Doxycyline. ICU evaluation called because of hypotension and increased Lactic Acid levels from 2.3 to 4.5. Pertinent  Medical History  LE weeping/painful wounds  Significant Hospital Events: Including procedures, antibiotic start and stop dates in addition to other pertinent events   3/12: admitted to FM, worsening LA, hypotension  Interim History / Subjective:  N/a  Objective   Blood pressure (!) 82/61, pulse 93, temperature (!) 96.7 F (35.9 C), temperature source Axillary, resp. rate 16, height 5\' 6"  (1.676 m), weight 86.2 kg, SpO2 100%.       No intake or output data in the 24 hours ending 04/30/2023 0027 Filed Weights   04/19/23 1544  Weight: 86.2 kg    Examination: General: awake but lethargic NAD HENT: Pupils reactive, dry mucous membrane, pos JVD Lungs: CTA no wheezes no rales Cardiovascular: reg s1s2 no murmurs no gallops Abdomen: soft nt nd bs pos no guarding no rigidity Extremities: LE b/l warm, painful to touch, new bandage b/l le, chronic changes otherwise Neuro: mild lethargy but AAOx3, CN II to XII grossly  intact   Resolved Hospital Problem list   N/a  Assessment & Plan:  Sepsis secondary to LE wounds  Patient on broad spectrum antibiotics  However, she is more hypotensive  Must r/o necrotizing fascitis in the setting of LE wounds/cellulitis Hypotension  Likely with need IV pressors  Volume resuscitation  Increased BMP but in the setting of AKI, get cxr to see if she has any pulmonary edema Lactic Acidosis  Abd soft , likely related to her LE wounds, I asked FM to call vascular  CT scan abd and LE for necrotizing fasciitis  Continue with IVF and can give Albumin as well AKI on chronic  Likely from hypotension  Continue to monitor Hypoglycemia  Continue with D5LR  H/o DVT-not on anticoagulation, will order venous dopplers for DVT  LMWH for now, switch to full dose Heparin if she has a DVT  Best Practice (right click and "Reselect all SmartList Selections" daily)   Diet/type: NPO w/ oral meds DVT prophylaxis LMWH Pressure ulcer(s): N/A GI prophylaxis: N/A Lines: N/A Foley:  N/A Code Status:  full code   Labs   CBC: Recent Labs  Lab 04/19/23 1618  WBC 12.5*  NEUTROABS 11.4*  HGB 15.2*  HCT 45.4  MCV 95.0  PLT 392    Basic Metabolic Panel: Recent Labs  Lab 04/19/23 1618  NA 130*  K 5.1  CL 100  CO2 17*  GLUCOSE 70  BUN 37*  CREATININE 2.21*  CALCIUM 8.7*  MG 1.7  GFR: Estimated Creatinine Clearance: 30.7 mL/min (A) (by C-G formula based on SCr of 2.21 mg/dL (H)). Recent Labs  Lab 04/19/23 1618 04/19/23 1857 04/19/23 2252  WBC 12.5*  --   --   LATICACIDVEN  --  2.3* 4.5*    Liver Function Tests: Recent Labs  Lab 04/19/23 1618  AST 99*  ALT 45*  ALKPHOS 247*  BILITOT 2.6*  PROT 7.0  ALBUMIN 2.6*   No results for input(s): "LIPASE", "AMYLASE" in the last 168 hours. No results for input(s): "AMMONIA" in the last 168 hours.  ABG    Component Value Date/Time   TCO2 32 08/03/2012 1329     Coagulation Profile: No results for  input(s): "INR", "PROTIME" in the last 168 hours.  Cardiac Enzymes: No results for input(s): "CKTOTAL", "CKMB", "CKMBINDEX", "TROPONINI" in the last 168 hours.  HbA1C: Hgb A1c MFr Bld  Date/Time Value Ref Range Status  04/19/2023 08:04 PM 7.4 (H) 4.8 - 5.6 % Final    Comment:    (NOTE) Pre diabetes:          5.7%-6.4%  Diabetes:              >6.4%  Glycemic control for   <7.0% adults with diabetes   07/30/2022 11:48 AM 6.9 (H) 4.8 - 5.6 % Final    Comment:             Prediabetes: 5.7 - 6.4          Diabetes: >6.4          Glycemic control for adults with diabetes: <7.0     CBG: Recent Labs  Lab 04/19/23 2031 04/19/23 2103 04/19/23 2137 04/19/23 2245 04/30/2023 0007  GLUCAP 74 37* 174* 147* 90    Review of Systems:   12 ROS conducted patient without complaints Denies SOB, cough, chest pain, abd pain, dysuria, c/o LE pain b/l  Past Medical History:  She,  has a past medical history of Diabetes mellitus without complication (HCC), DVT (deep venous thrombosis) (HCC), Hyperlipidemia, and Hypertension.   Surgical History:  History reviewed. No pertinent surgical history.   Social History:   reports that she quit smoking about 11 years ago. Her smoking use included cigarettes. She has never used smokeless tobacco. She reports that she does not drink alcohol and does not use drugs.   Family History:  Her family history includes Hyperlipidemia in her sister; Hypertension in her father and mother.   Allergies Allergies  Allergen Reactions   Cortisone Swelling     Home Medications  Prior to Admission medications   Medication Sig Start Date End Date Taking? Authorizing Provider  acetaminophen (TYLENOL) 500 MG tablet Take 2 tablets (1,000 mg total) by mouth every 8 (eight) hours as needed. 09/21/19  Yes Brimage, Vondra, DO  doxycycline (VIBRA-TABS) 100 MG tablet Take 1 tablet (100 mg total) by mouth 2 (two) times daily for 10 days. 04/12/23 04/22/23 Yes Glendale Chard, DO   DULoxetine (CYMBALTA) 60 MG capsule Take 1 capsule (60 mg total) by mouth daily. 04/12/23  Yes Glendale Chard, DO  empagliflozin (JARDIANCE) 25 MG TABS tablet Take 1 tablet (25 mg total) by mouth daily. 04/12/23  Yes Glendale Chard, DO  furosemide (LASIX) 40 MG tablet TAKE 1 TABLET(40 MG) BY MOUTH EVERY MORNING 11/19/22  Yes Lockie Mola, MD  losartan (COZAAR) 100 MG tablet Take 1 tablet (100 mg total) by mouth daily. 04/12/23  Yes Glendale Chard, DO  metFORMIN (GLUCOPHAGE-XR) 500 MG 24 hr tablet Take 2  tablets (1,000 mg total) by mouth 2 (two) times daily with a meal. 04/12/23  Yes Glendale Chard, DO  Multiple Vitamin (MULTIVITAMIN) capsule Take 1 capsule by mouth daily. 01/02/19  Yes Mirian Mo, MD  rosuvastatin (CRESTOR) 40 MG tablet Take 1 tablet (40 mg total) by mouth daily. 04/12/23  Yes Glendale Chard, DO  triamcinolone (KENALOG) 0.025 % cream Apply 1 Application topically daily as needed. 01/04/23  Yes Lincoln Brigham, MD  Blood Pressure Monitoring (BLOOD PRESSURE MONITOR AUTOMAT) DEVI 1 kit by Does not apply route daily. Please call the office if you have any issues obtaining this monitor. 06/18/19   Melene Plan, MD     Critical care time: 48   The patient is critically ill with multiple organ system failure and requires high complexity decision making for assessment and support, frequent evaluation and titration of therapies, advanced monitoring, review of radiographic studies and interpretation of complex data.   Critical Care Time devoted to patient care services, exclusive of separately billable procedures, described in this note is 40 minutes.   Rozann Lesches, MD [facility] See Amion for pager  If no response to pager , please call [phone] until 7pm After 7:00 pm call Elink  [phone] 04/28/2023, 12:27 AM

## 2023-05-10 DEATH — deceased

## 2023-05-27 ENCOUNTER — Encounter: Admitting: Vascular Surgery

## 2023-05-27 ENCOUNTER — Encounter (HOSPITAL_COMMUNITY)
